# Patient Record
Sex: Male | Born: 1955 | Race: White | Hispanic: No | Marital: Single | Smoking: Former smoker
Health system: Southern US, Community
[De-identification: ages and names within clinical notes are randomized; demographics above are authoritative.]

## PROBLEM LIST (undated history)

## (undated) DIAGNOSIS — R740 Nonspecific elevation of levels of transaminase and lactic acid dehydrogenase [LDH]: Secondary | ICD-10-CM

## (undated) DIAGNOSIS — N62 Hypertrophy of breast: Secondary | ICD-10-CM

## (undated) DIAGNOSIS — I4891 Unspecified atrial fibrillation: Secondary | ICD-10-CM

## (undated) DIAGNOSIS — I5022 Chronic systolic (congestive) heart failure: Secondary | ICD-10-CM

## (undated) DIAGNOSIS — I2510 Atherosclerotic heart disease of native coronary artery without angina pectoris: Secondary | ICD-10-CM

## (undated) DIAGNOSIS — R7402 Elevation of levels of lactic acid dehydrogenase (LDH): Secondary | ICD-10-CM

## (undated) DIAGNOSIS — I255 Ischemic cardiomyopathy: Secondary | ICD-10-CM

## (undated) DIAGNOSIS — I4729 Other ventricular tachycardia: Secondary | ICD-10-CM

## (undated) DIAGNOSIS — E785 Hyperlipidemia, unspecified: Secondary | ICD-10-CM

## (undated) DIAGNOSIS — Z95810 Presence of automatic (implantable) cardiac defibrillator: Secondary | ICD-10-CM

## (undated) DIAGNOSIS — I472 Ventricular tachycardia, unspecified: Secondary | ICD-10-CM

## (undated) DIAGNOSIS — R7401 Elevation of levels of liver transaminase levels: Secondary | ICD-10-CM

## (undated) DIAGNOSIS — I442 Atrioventricular block, complete: Secondary | ICD-10-CM

## (undated) DIAGNOSIS — Z7901 Long term (current) use of anticoagulants: Secondary | ICD-10-CM

## (undated) DIAGNOSIS — E0590 Thyrotoxicosis, unspecified without thyrotoxic crisis or storm: Secondary | ICD-10-CM

## (undated) HISTORY — PX: SPLENECTOMY: SUR1306

## (undated) HISTORY — DX: Atrioventricular block, complete: I44.2

## (undated) HISTORY — DX: Morbid (severe) obesity due to excess calories: E66.01

## (undated) HISTORY — DX: Ventricular tachycardia, unspecified: I47.20

## (undated) HISTORY — DX: Ischemic cardiomyopathy: I25.5

## (undated) HISTORY — DX: Unspecified atrial fibrillation: I48.91

## (undated) HISTORY — DX: Other ventricular tachycardia: I47.29

## (undated) HISTORY — PX: OTHER SURGICAL HISTORY: SHX169

## (undated) HISTORY — DX: Hypertrophy of breast: N62

## (undated) HISTORY — DX: Nonspecific elevation of levels of transaminase and lactic acid dehydrogenase (ldh): R74.0

## (undated) HISTORY — DX: Ventricular tachycardia: I47.2

## (undated) HISTORY — DX: Atherosclerotic heart disease of native coronary artery without angina pectoris: I25.10

## (undated) HISTORY — DX: Elevation of levels of liver transaminase levels: R74.01

## (undated) HISTORY — DX: Presence of automatic (implantable) cardiac defibrillator: Z95.810

## (undated) HISTORY — DX: Hyperlipidemia, unspecified: E78.5

## (undated) HISTORY — DX: Thyrotoxicosis, unspecified without thyrotoxic crisis or storm: E05.90

## (undated) HISTORY — DX: Chronic systolic (congestive) heart failure: I50.22

## (undated) HISTORY — DX: Elevation of levels of lactic acid dehydrogenase (LDH): R74.02

## (undated) HISTORY — DX: Long term (current) use of anticoagulants: Z79.01

---

## 1998-10-05 ENCOUNTER — Inpatient Hospital Stay (HOSPITAL_COMMUNITY): Admission: EM | Admit: 1998-10-05 | Discharge: 1998-10-09 | Payer: Self-pay | Admitting: Emergency Medicine

## 1998-10-05 ENCOUNTER — Encounter: Payer: Self-pay | Admitting: Emergency Medicine

## 1998-10-15 ENCOUNTER — Inpatient Hospital Stay (HOSPITAL_COMMUNITY): Admission: EM | Admit: 1998-10-15 | Discharge: 1998-10-28 | Payer: Self-pay | Admitting: Cardiology

## 1998-10-27 ENCOUNTER — Encounter: Payer: Self-pay | Admitting: Cardiology

## 2000-01-03 ENCOUNTER — Encounter: Payer: Self-pay | Admitting: *Deleted

## 2000-01-03 ENCOUNTER — Inpatient Hospital Stay (HOSPITAL_COMMUNITY): Admission: EM | Admit: 2000-01-03 | Discharge: 2000-01-13 | Payer: Self-pay | Admitting: *Deleted

## 2000-03-08 ENCOUNTER — Ambulatory Visit (HOSPITAL_COMMUNITY): Admission: RE | Admit: 2000-03-08 | Discharge: 2000-03-08 | Payer: Self-pay | Admitting: Internal Medicine

## 2000-09-27 ENCOUNTER — Emergency Department (HOSPITAL_COMMUNITY): Admission: EM | Admit: 2000-09-27 | Discharge: 2000-09-27 | Payer: Self-pay | Admitting: Emergency Medicine

## 2000-11-21 ENCOUNTER — Encounter: Payer: Self-pay | Admitting: Emergency Medicine

## 2000-11-21 ENCOUNTER — Inpatient Hospital Stay (HOSPITAL_COMMUNITY): Admission: EM | Admit: 2000-11-21 | Discharge: 2000-11-22 | Payer: Self-pay | Admitting: Emergency Medicine

## 2000-11-22 ENCOUNTER — Encounter: Payer: Self-pay | Admitting: Internal Medicine

## 2000-12-22 ENCOUNTER — Ambulatory Visit (HOSPITAL_COMMUNITY): Admission: RE | Admit: 2000-12-22 | Discharge: 2000-12-23 | Payer: Self-pay | Admitting: Internal Medicine

## 2000-12-23 ENCOUNTER — Encounter: Payer: Self-pay | Admitting: Internal Medicine

## 2001-02-22 ENCOUNTER — Inpatient Hospital Stay (HOSPITAL_COMMUNITY): Admission: AD | Admit: 2001-02-22 | Discharge: 2001-02-28 | Payer: Self-pay | Admitting: *Deleted

## 2001-02-24 ENCOUNTER — Encounter: Payer: Self-pay | Admitting: Internal Medicine

## 2001-02-26 ENCOUNTER — Encounter: Payer: Self-pay | Admitting: Cardiology

## 2002-10-11 ENCOUNTER — Inpatient Hospital Stay (HOSPITAL_COMMUNITY): Admission: AD | Admit: 2002-10-11 | Discharge: 2002-10-12 | Payer: Self-pay | Admitting: Emergency Medicine

## 2002-10-11 ENCOUNTER — Encounter: Payer: Self-pay | Admitting: Emergency Medicine

## 2002-10-16 ENCOUNTER — Inpatient Hospital Stay (HOSPITAL_COMMUNITY): Admission: EM | Admit: 2002-10-16 | Discharge: 2002-10-18 | Payer: Self-pay | Admitting: Emergency Medicine

## 2002-10-17 ENCOUNTER — Encounter: Payer: Self-pay | Admitting: *Deleted

## 2002-10-31 ENCOUNTER — Emergency Department (HOSPITAL_COMMUNITY): Admission: EM | Admit: 2002-10-31 | Discharge: 2002-10-31 | Payer: Self-pay | Admitting: Emergency Medicine

## 2002-10-31 ENCOUNTER — Encounter: Payer: Self-pay | Admitting: Emergency Medicine

## 2003-10-24 ENCOUNTER — Ambulatory Visit (HOSPITAL_COMMUNITY): Admission: RE | Admit: 2003-10-24 | Discharge: 2003-10-24 | Payer: Self-pay | Admitting: Endocrinology

## 2003-10-28 ENCOUNTER — Observation Stay (HOSPITAL_COMMUNITY): Admission: RE | Admit: 2003-10-28 | Discharge: 2003-10-28 | Payer: Self-pay | Admitting: Internal Medicine

## 2004-01-16 ENCOUNTER — Inpatient Hospital Stay (HOSPITAL_COMMUNITY): Admission: EM | Admit: 2004-01-16 | Discharge: 2004-01-21 | Payer: Self-pay | Admitting: Cardiology

## 2004-01-19 ENCOUNTER — Encounter: Payer: Self-pay | Admitting: Cardiology

## 2004-02-16 ENCOUNTER — Ambulatory Visit: Admission: RE | Admit: 2004-02-16 | Discharge: 2004-02-16 | Payer: Self-pay | Admitting: Internal Medicine

## 2004-03-11 ENCOUNTER — Ambulatory Visit: Payer: Self-pay

## 2004-03-27 ENCOUNTER — Emergency Department: Payer: Self-pay | Admitting: General Practice

## 2004-03-29 ENCOUNTER — Ambulatory Visit: Payer: Self-pay | Admitting: Internal Medicine

## 2004-03-29 ENCOUNTER — Ambulatory Visit: Payer: Self-pay | Admitting: Cardiology

## 2004-04-06 ENCOUNTER — Ambulatory Visit: Payer: Self-pay | Admitting: Internal Medicine

## 2004-04-19 ENCOUNTER — Ambulatory Visit: Payer: Self-pay | Admitting: Cardiology

## 2004-04-19 ENCOUNTER — Emergency Department: Payer: Self-pay | Admitting: Emergency Medicine

## 2004-04-29 ENCOUNTER — Ambulatory Visit: Payer: Self-pay | Admitting: Cardiovascular Disease

## 2004-04-29 ENCOUNTER — Ambulatory Visit: Payer: Self-pay | Admitting: *Deleted

## 2004-05-04 ENCOUNTER — Ambulatory Visit: Payer: Self-pay | Admitting: *Deleted

## 2004-05-10 ENCOUNTER — Ambulatory Visit: Payer: Self-pay | Admitting: Internal Medicine

## 2004-05-19 ENCOUNTER — Ambulatory Visit: Payer: Self-pay | Admitting: Internal Medicine

## 2004-06-07 ENCOUNTER — Ambulatory Visit: Payer: Self-pay | Admitting: *Deleted

## 2004-06-09 ENCOUNTER — Ambulatory Visit: Payer: Self-pay | Admitting: Internal Medicine

## 2004-06-10 ENCOUNTER — Ambulatory Visit: Payer: Self-pay | Admitting: Internal Medicine

## 2004-06-11 ENCOUNTER — Inpatient Hospital Stay (HOSPITAL_COMMUNITY): Admission: RE | Admit: 2004-06-11 | Discharge: 2004-06-12 | Payer: Self-pay | Admitting: Internal Medicine

## 2004-06-18 ENCOUNTER — Ambulatory Visit: Payer: Self-pay | Admitting: Internal Medicine

## 2004-06-21 ENCOUNTER — Ambulatory Visit: Payer: Self-pay | Admitting: Cardiology

## 2004-06-25 ENCOUNTER — Ambulatory Visit: Payer: Self-pay | Admitting: Cardiology

## 2004-07-06 ENCOUNTER — Ambulatory Visit: Payer: Self-pay | Admitting: Internal Medicine

## 2004-07-13 ENCOUNTER — Ambulatory Visit: Payer: Self-pay | Admitting: Internal Medicine

## 2004-07-13 ENCOUNTER — Ambulatory Visit: Payer: Self-pay

## 2004-08-17 ENCOUNTER — Ambulatory Visit: Payer: Self-pay

## 2004-08-17 ENCOUNTER — Ambulatory Visit: Payer: Self-pay | Admitting: Internal Medicine

## 2004-08-31 ENCOUNTER — Ambulatory Visit: Payer: Self-pay | Admitting: Internal Medicine

## 2004-09-08 ENCOUNTER — Ambulatory Visit: Payer: Self-pay | Admitting: Cardiology

## 2004-10-11 ENCOUNTER — Ambulatory Visit: Payer: Self-pay | Admitting: Internal Medicine

## 2004-11-16 ENCOUNTER — Ambulatory Visit: Payer: Self-pay | Admitting: Cardiology

## 2004-11-26 ENCOUNTER — Ambulatory Visit: Payer: Self-pay | Admitting: Cardiology

## 2004-12-13 ENCOUNTER — Ambulatory Visit: Payer: Self-pay | Admitting: Internal Medicine

## 2004-12-29 ENCOUNTER — Ambulatory Visit: Payer: Self-pay | Admitting: Cardiology

## 2005-01-19 ENCOUNTER — Ambulatory Visit: Payer: Self-pay | Admitting: Cardiology

## 2005-03-08 ENCOUNTER — Ambulatory Visit: Payer: Self-pay | Admitting: Cardiology

## 2005-03-25 ENCOUNTER — Ambulatory Visit: Payer: Self-pay | Admitting: Cardiology

## 2005-04-15 ENCOUNTER — Ambulatory Visit: Payer: Self-pay | Admitting: *Deleted

## 2005-05-16 ENCOUNTER — Ambulatory Visit: Payer: Self-pay | Admitting: Cardiology

## 2005-06-20 ENCOUNTER — Ambulatory Visit: Payer: Self-pay | Admitting: *Deleted

## 2005-06-20 ENCOUNTER — Ambulatory Visit: Payer: Self-pay | Admitting: Cardiology

## 2005-07-01 ENCOUNTER — Ambulatory Visit: Payer: Self-pay | Admitting: Internal Medicine

## 2005-08-12 ENCOUNTER — Ambulatory Visit: Payer: Self-pay | Admitting: Cardiology

## 2005-08-29 ENCOUNTER — Ambulatory Visit: Payer: Self-pay | Admitting: Internal Medicine

## 2005-09-05 ENCOUNTER — Inpatient Hospital Stay (HOSPITAL_BASED_OUTPATIENT_CLINIC_OR_DEPARTMENT_OTHER): Admission: RE | Admit: 2005-09-05 | Discharge: 2005-09-05 | Payer: Self-pay | Admitting: Cardiovascular Disease

## 2005-09-05 ENCOUNTER — Ambulatory Visit: Payer: Self-pay | Admitting: Cardiovascular Disease

## 2005-09-05 ENCOUNTER — Ambulatory Visit: Payer: Self-pay | Admitting: Cardiology

## 2005-09-09 ENCOUNTER — Ambulatory Visit: Payer: Self-pay | Admitting: *Deleted

## 2005-09-21 ENCOUNTER — Ambulatory Visit: Payer: Self-pay | Admitting: Cardiology

## 2005-09-29 ENCOUNTER — Ambulatory Visit: Payer: Self-pay | Admitting: *Deleted

## 2005-10-04 ENCOUNTER — Ambulatory Visit: Payer: Self-pay | Admitting: Internal Medicine

## 2005-11-01 ENCOUNTER — Ambulatory Visit: Payer: Self-pay | Admitting: *Deleted

## 2005-11-18 ENCOUNTER — Ambulatory Visit: Payer: Self-pay | Admitting: Cardiology

## 2005-11-29 ENCOUNTER — Ambulatory Visit: Payer: Self-pay | Admitting: *Deleted

## 2005-12-28 ENCOUNTER — Ambulatory Visit: Payer: Self-pay | Admitting: *Deleted

## 2006-01-08 ENCOUNTER — Ambulatory Visit: Payer: Self-pay | Admitting: Internal Medicine

## 2006-02-09 ENCOUNTER — Ambulatory Visit: Payer: Self-pay | Admitting: Cardiology

## 2006-02-16 ENCOUNTER — Ambulatory Visit: Payer: Self-pay | Admitting: Cardiology

## 2006-04-10 ENCOUNTER — Ambulatory Visit: Payer: Self-pay

## 2006-07-12 ENCOUNTER — Ambulatory Visit: Payer: Self-pay | Admitting: Internal Medicine

## 2006-08-22 ENCOUNTER — Inpatient Hospital Stay (HOSPITAL_COMMUNITY): Admission: EM | Admit: 2006-08-22 | Discharge: 2006-08-22 | Payer: Self-pay | Admitting: Emergency Medicine

## 2006-09-11 ENCOUNTER — Ambulatory Visit: Payer: Self-pay | Admitting: Cardiology

## 2006-09-18 ENCOUNTER — Ambulatory Visit: Payer: Self-pay | Admitting: Cardiology

## 2006-10-02 ENCOUNTER — Ambulatory Visit: Payer: Self-pay | Admitting: Cardiology

## 2006-10-11 ENCOUNTER — Ambulatory Visit: Payer: Self-pay | Admitting: Internal Medicine

## 2006-11-06 ENCOUNTER — Ambulatory Visit: Payer: Self-pay | Admitting: Internal Medicine

## 2006-11-15 ENCOUNTER — Ambulatory Visit: Payer: Self-pay | Admitting: Internal Medicine

## 2006-11-15 LAB — CONVERTED CEMR LAB
ALT: 30 units/L (ref 0–53)
AST: 29 units/L (ref 0–37)
Alkaline Phosphatase: 61 units/L (ref 39–117)
Digitoxin Lvl: 0.3 ng/mL — ABNORMAL LOW (ref 0.8–2.0)

## 2006-11-24 ENCOUNTER — Ambulatory Visit: Payer: Self-pay | Admitting: Internal Medicine

## 2006-12-01 ENCOUNTER — Ambulatory Visit: Payer: Self-pay | Admitting: Cardiology

## 2007-01-25 ENCOUNTER — Ambulatory Visit (HOSPITAL_COMMUNITY): Admission: RE | Admit: 2007-01-25 | Discharge: 2007-01-25 | Payer: Self-pay | Admitting: Cardiology

## 2007-01-25 ENCOUNTER — Ambulatory Visit: Payer: Self-pay | Admitting: Cardiology

## 2007-01-25 LAB — CONVERTED CEMR LAB
ALT: 22 units/L (ref 0–53)
Albumin: 3.5 g/dL (ref 3.5–5.2)
Alkaline Phosphatase: 60 units/L (ref 39–117)
BUN: 14 mg/dL (ref 6–23)
CO2: 27 meq/L (ref 19–32)
Calcium: 9 mg/dL (ref 8.4–10.5)
Chloride: 108 meq/L (ref 96–112)
HDL: 30.9 mg/dL — ABNORMAL LOW (ref >39.0)
LDL Cholesterol: 96 mg/dL (ref 0–99)
Lymphocytes Relative: 27.6 % (ref 12.0–46.0)
Neutro Abs: 5.3 10*3/uL (ref 1.4–7.7)
Platelets: 187 10*3/uL (ref 150–400)
Potassium: 4.4 meq/L (ref 3.5–5.1)
RDW: 13.9 % (ref 11.5–14.6)
Sodium: 140 meq/L (ref 135–145)
TSH: 1.41 microintl units/mL (ref 0.35–5.50)
Total Bilirubin: 1.1 mg/dL (ref 0.3–1.2)
Total CHOL/HDL Ratio: 4.7
Total Protein: 6.6 g/dL (ref 6.0–8.3)
Triglycerides: 96 mg/dL (ref 0–149)
VLDL: 19 mg/dL (ref 0–40)

## 2007-02-13 ENCOUNTER — Ambulatory Visit: Payer: Self-pay | Admitting: Internal Medicine

## 2007-05-05 ENCOUNTER — Emergency Department (HOSPITAL_COMMUNITY): Admission: EM | Admit: 2007-05-05 | Discharge: 2007-05-05 | Payer: Self-pay | Admitting: Emergency Medicine

## 2007-05-15 ENCOUNTER — Ambulatory Visit: Payer: Self-pay | Admitting: Internal Medicine

## 2007-07-08 ENCOUNTER — Emergency Department (HOSPITAL_COMMUNITY): Admission: EM | Admit: 2007-07-08 | Discharge: 2007-07-09 | Payer: Self-pay | Admitting: Emergency Medicine

## 2007-07-16 ENCOUNTER — Ambulatory Visit: Payer: Self-pay | Admitting: Cardiology

## 2007-07-16 LAB — CONVERTED CEMR LAB: Prothrombin Time: 17.7 s — ABNORMAL HIGH (ref 10.9–13.3)

## 2007-07-23 ENCOUNTER — Ambulatory Visit: Payer: Self-pay

## 2007-07-23 LAB — CONVERTED CEMR LAB
ALT: 30 U/L
AST: 28 U/L
Albumin: 3.6 g/dL
Alkaline Phosphatase: 60 U/L
BUN: 18 mg/dL
Bilirubin, Direct: 0.1 mg/dL
CO2: 27 meq/L
Calcium: 9 mg/dL
Chloride: 107 meq/L
Cholesterol: 153 mg/dL
Creatinine, Ser: 1.2 mg/dL
GFR calc Af Amer: 82 mL/min
GFR calc non Af Amer: 68 mL/min
Glucose, Bld: 127 mg/dL — ABNORMAL HIGH
HDL: 31.9 mg/dL — ABNORMAL LOW
LDL Cholesterol: 103 mg/dL — ABNORMAL HIGH
Potassium: 4.6 meq/L
Sodium: 139 meq/L
TSH: 1.99 u[IU]/mL
Total Bilirubin: 1 mg/dL
Total CHOL/HDL Ratio: 4.8
Total Protein: 6.7 g/dL
Triglycerides: 90 mg/dL
VLDL: 18 mg/dL

## 2007-08-14 ENCOUNTER — Ambulatory Visit: Payer: Self-pay | Admitting: Internal Medicine

## 2007-10-27 ENCOUNTER — Other Ambulatory Visit: Payer: Self-pay

## 2007-10-27 ENCOUNTER — Ambulatory Visit: Payer: Self-pay | Admitting: Cardiology

## 2007-10-27 ENCOUNTER — Inpatient Hospital Stay: Payer: Self-pay | Admitting: Internal Medicine

## 2007-11-19 ENCOUNTER — Ambulatory Visit: Payer: Self-pay | Admitting: Cardiology

## 2007-12-18 ENCOUNTER — Ambulatory Visit: Payer: Self-pay | Admitting: Internal Medicine

## 2008-02-08 ENCOUNTER — Emergency Department: Payer: Self-pay | Admitting: Emergency Medicine

## 2008-03-19 ENCOUNTER — Ambulatory Visit: Payer: Self-pay | Admitting: Internal Medicine

## 2008-06-11 ENCOUNTER — Encounter: Payer: Self-pay | Admitting: Internal Medicine

## 2008-06-18 ENCOUNTER — Ambulatory Visit: Payer: Self-pay

## 2008-07-14 ENCOUNTER — Ambulatory Visit: Payer: Self-pay | Admitting: Cardiology

## 2008-07-18 DIAGNOSIS — I472 Ventricular tachycardia, unspecified: Secondary | ICD-10-CM | POA: Insufficient documentation

## 2008-07-18 DIAGNOSIS — I4891 Unspecified atrial fibrillation: Secondary | ICD-10-CM | POA: Insufficient documentation

## 2008-07-18 DIAGNOSIS — E785 Hyperlipidemia, unspecified: Secondary | ICD-10-CM | POA: Insufficient documentation

## 2008-07-18 DIAGNOSIS — E0590 Thyrotoxicosis, unspecified without thyrotoxic crisis or storm: Secondary | ICD-10-CM | POA: Insufficient documentation

## 2008-07-21 ENCOUNTER — Ambulatory Visit: Payer: Self-pay | Admitting: Cardiology

## 2008-08-11 ENCOUNTER — Ambulatory Visit (HOSPITAL_COMMUNITY): Admission: RE | Admit: 2008-08-11 | Discharge: 2008-08-11 | Payer: Self-pay | Admitting: Cardiology

## 2008-08-11 ENCOUNTER — Ambulatory Visit: Payer: Self-pay | Admitting: Cardiology

## 2008-08-15 ENCOUNTER — Encounter (INDEPENDENT_AMBULATORY_CARE_PROVIDER_SITE_OTHER): Payer: Self-pay | Admitting: *Deleted

## 2008-09-01 ENCOUNTER — Telehealth: Payer: Self-pay | Admitting: Cardiology

## 2008-09-08 ENCOUNTER — Encounter: Payer: Self-pay | Admitting: Cardiology

## 2008-09-19 ENCOUNTER — Encounter: Payer: Self-pay | Admitting: Internal Medicine

## 2008-09-25 ENCOUNTER — Ambulatory Visit: Payer: Self-pay | Admitting: Cardiology

## 2008-09-29 ENCOUNTER — Ambulatory Visit: Payer: Self-pay | Admitting: Internal Medicine

## 2008-10-09 ENCOUNTER — Encounter: Payer: Self-pay | Admitting: Cardiology

## 2008-10-09 ENCOUNTER — Ambulatory Visit: Payer: Self-pay | Admitting: Cardiology

## 2008-10-15 ENCOUNTER — Encounter (INDEPENDENT_AMBULATORY_CARE_PROVIDER_SITE_OTHER): Payer: Self-pay | Admitting: *Deleted

## 2008-10-27 ENCOUNTER — Emergency Department (HOSPITAL_COMMUNITY): Admission: EM | Admit: 2008-10-27 | Discharge: 2008-10-27 | Payer: Self-pay | Admitting: Emergency Medicine

## 2008-10-30 ENCOUNTER — Encounter: Payer: Self-pay | Admitting: Cardiology

## 2008-10-30 ENCOUNTER — Ambulatory Visit: Payer: Self-pay | Admitting: Cardiology

## 2008-10-31 ENCOUNTER — Ambulatory Visit: Payer: Self-pay | Admitting: Cardiology

## 2008-10-31 ENCOUNTER — Inpatient Hospital Stay (HOSPITAL_COMMUNITY): Admission: EM | Admit: 2008-10-31 | Discharge: 2008-11-08 | Payer: Self-pay | Admitting: Emergency Medicine

## 2008-10-31 ENCOUNTER — Telehealth: Payer: Self-pay | Admitting: Cardiology

## 2008-11-04 ENCOUNTER — Encounter: Payer: Self-pay | Admitting: Cardiology

## 2008-11-09 ENCOUNTER — Emergency Department (HOSPITAL_COMMUNITY): Admission: EM | Admit: 2008-11-09 | Discharge: 2008-11-09 | Payer: Self-pay | Admitting: Emergency Medicine

## 2008-11-10 ENCOUNTER — Telehealth: Payer: Self-pay | Admitting: Cardiology

## 2008-11-11 ENCOUNTER — Encounter (INDEPENDENT_AMBULATORY_CARE_PROVIDER_SITE_OTHER): Payer: Self-pay | Admitting: Cardiology

## 2008-11-11 ENCOUNTER — Ambulatory Visit: Payer: Self-pay | Admitting: Cardiovascular Disease

## 2008-11-11 LAB — CONVERTED CEMR LAB: POC INR: 1.2

## 2008-11-14 ENCOUNTER — Telehealth: Payer: Self-pay | Admitting: Cardiovascular Disease

## 2008-11-17 ENCOUNTER — Ambulatory Visit: Payer: Self-pay | Admitting: Cardiology

## 2008-11-24 ENCOUNTER — Ambulatory Visit: Payer: Self-pay | Admitting: Cardiology

## 2008-11-26 ENCOUNTER — Encounter: Payer: Self-pay | Admitting: Physician Assistant

## 2008-11-26 ENCOUNTER — Ambulatory Visit: Payer: Self-pay | Admitting: Cardiology

## 2008-11-26 DIAGNOSIS — I5023 Acute on chronic systolic (congestive) heart failure: Secondary | ICD-10-CM | POA: Insufficient documentation

## 2008-11-27 ENCOUNTER — Ambulatory Visit: Payer: Self-pay | Admitting: Cardiology

## 2008-12-01 ENCOUNTER — Ambulatory Visit: Payer: Self-pay | Admitting: Internal Medicine

## 2008-12-01 DIAGNOSIS — I442 Atrioventricular block, complete: Secondary | ICD-10-CM | POA: Insufficient documentation

## 2008-12-02 LAB — CONVERTED CEMR LAB
BUN: 18 mg/dL
CO2: 28 meq/L
Calcium: 9 mg/dL
Chloride: 108 meq/L
Creatinine, Ser: 1 mg/dL
GFR calc non Af Amer: 83.11 mL/min
Glucose, Bld: 174 mg/dL — ABNORMAL HIGH
Potassium: 4 meq/L
Sodium: 140 meq/L

## 2008-12-04 ENCOUNTER — Encounter: Payer: Self-pay | Admitting: Cardiology

## 2008-12-04 ENCOUNTER — Ambulatory Visit: Payer: Self-pay

## 2008-12-12 ENCOUNTER — Telehealth (INDEPENDENT_AMBULATORY_CARE_PROVIDER_SITE_OTHER): Payer: Self-pay | Admitting: *Deleted

## 2008-12-12 ENCOUNTER — Ambulatory Visit: Payer: Self-pay | Admitting: Internal Medicine

## 2008-12-12 LAB — CONVERTED CEMR LAB
Basophils Absolute: 0 10*3/uL (ref 0.0–0.1)
CO2: 28 meq/L (ref 19–32)
Chloride: 106 meq/L (ref 96–112)
Eosinophils Relative: 1.8 % (ref 0.0–5.0)
HCT: 42.3 % (ref 39.0–52.0)
Hemoglobin: 14.5 g/dL (ref 13.0–17.0)
Lymphocytes Relative: 20.1 % (ref 12.0–46.0)
MCHC: 34.4 g/dL (ref 30.0–36.0)
Monocytes Relative: 9.1 % (ref 3.0–12.0)
Neutrophils Relative %: 68.6 % (ref 43.0–77.0)
Prothrombin Time: 21.3 s — ABNORMAL HIGH (ref 9.1–11.7)
RDW: 13.7 % (ref 11.5–14.6)
Sodium: 140 meq/L (ref 135–145)
WBC: 10.1 10*3/uL (ref 4.5–10.5)

## 2008-12-15 ENCOUNTER — Encounter: Payer: Self-pay | Admitting: *Deleted

## 2008-12-17 ENCOUNTER — Ambulatory Visit: Payer: Self-pay | Admitting: Internal Medicine

## 2008-12-17 ENCOUNTER — Ambulatory Visit (HOSPITAL_COMMUNITY): Admission: RE | Admit: 2008-12-17 | Discharge: 2008-12-18 | Payer: Self-pay | Admitting: Internal Medicine

## 2008-12-18 ENCOUNTER — Encounter: Payer: Self-pay | Admitting: Internal Medicine

## 2008-12-22 ENCOUNTER — Ambulatory Visit: Payer: Self-pay | Admitting: Cardiology

## 2009-01-01 ENCOUNTER — Ambulatory Visit: Payer: Self-pay | Admitting: Internal Medicine

## 2009-01-01 ENCOUNTER — Encounter: Payer: Self-pay | Admitting: Internal Medicine

## 2009-01-01 ENCOUNTER — Ambulatory Visit: Payer: Self-pay

## 2009-01-01 DIAGNOSIS — I509 Heart failure, unspecified: Secondary | ICD-10-CM | POA: Insufficient documentation

## 2009-01-01 LAB — CONVERTED CEMR LAB
BUN: 15 mg/dL (ref 6–23)
Chloride: 107 meq/L (ref 96–112)
GFR calc non Af Amer: 74.43 mL/min (ref >60)
Glucose, Bld: 71 mg/dL (ref 70–99)
Potassium: 3.6 meq/L (ref 3.5–5.1)
Sodium: 141 meq/L (ref 135–145)

## 2009-01-06 ENCOUNTER — Ambulatory Visit: Payer: Self-pay | Admitting: Internal Medicine

## 2009-01-06 ENCOUNTER — Telehealth: Payer: Self-pay | Admitting: Internal Medicine

## 2009-01-06 LAB — CONVERTED CEMR LAB
CO2: 26 meq/L (ref 19–32)
Calcium: 8.8 mg/dL (ref 8.4–10.5)
Creatinine, Ser: 1.1 mg/dL (ref 0.4–1.5)
Glucose, Bld: 126 mg/dL — ABNORMAL HIGH (ref 70–99)
Lymphocytes Relative: 19.9 % (ref 12.0–46.0)
MCHC: 34.5 g/dL (ref 30.0–36.0)
Monocytes Absolute: 0.8 10*3/uL (ref 0.1–1.0)
Platelets: 153 10*3/uL (ref 150.0–400.0)
Potassium: 3.7 meq/L (ref 3.5–5.1)
Sodium: 139 meq/L (ref 135–145)
WBC: 10.7 10*3/uL — ABNORMAL HIGH (ref 4.5–10.5)

## 2009-01-07 ENCOUNTER — Ambulatory Visit (HOSPITAL_COMMUNITY): Admission: RE | Admit: 2009-01-07 | Discharge: 2009-01-07 | Payer: Self-pay | Admitting: Internal Medicine

## 2009-01-13 ENCOUNTER — Ambulatory Visit (HOSPITAL_COMMUNITY): Admission: RE | Admit: 2009-01-13 | Discharge: 2009-01-13 | Payer: Self-pay | Admitting: Internal Medicine

## 2009-01-13 ENCOUNTER — Ambulatory Visit: Payer: Self-pay | Admitting: Internal Medicine

## 2009-01-20 ENCOUNTER — Telehealth: Payer: Self-pay | Admitting: Internal Medicine

## 2009-01-26 ENCOUNTER — Encounter (INDEPENDENT_AMBULATORY_CARE_PROVIDER_SITE_OTHER): Payer: Self-pay | Admitting: *Deleted

## 2009-02-02 ENCOUNTER — Ambulatory Visit: Payer: Self-pay | Admitting: Cardiology

## 2009-02-02 ENCOUNTER — Encounter: Payer: Self-pay | Admitting: Internal Medicine

## 2009-02-02 LAB — CONVERTED CEMR LAB: POC INR: 1.6

## 2009-02-04 ENCOUNTER — Encounter: Payer: Self-pay | Admitting: Internal Medicine

## 2009-02-06 ENCOUNTER — Observation Stay (HOSPITAL_COMMUNITY): Admission: RE | Admit: 2009-02-06 | Discharge: 2009-02-07 | Payer: Self-pay | Admitting: Internal Medicine

## 2009-02-06 ENCOUNTER — Ambulatory Visit: Payer: Self-pay | Admitting: Internal Medicine

## 2009-02-18 ENCOUNTER — Ambulatory Visit: Payer: Self-pay | Admitting: Cardiology

## 2009-02-18 LAB — CONVERTED CEMR LAB: POC INR: 1.9

## 2009-03-03 ENCOUNTER — Encounter (INDEPENDENT_AMBULATORY_CARE_PROVIDER_SITE_OTHER): Payer: Self-pay | Admitting: *Deleted

## 2009-03-04 ENCOUNTER — Ambulatory Visit: Payer: Self-pay | Admitting: Cardiology

## 2009-03-04 ENCOUNTER — Encounter: Payer: Self-pay | Admitting: Internal Medicine

## 2009-03-04 ENCOUNTER — Encounter: Payer: Self-pay | Admitting: Physician Assistant

## 2009-03-04 LAB — CONVERTED CEMR LAB: POC INR: 1.9

## 2009-03-12 LAB — CONVERTED CEMR LAB
BUN: 14 mg/dL (ref 6–23)
Calcium: 9.3 mg/dL (ref 8.4–10.5)
Chloride: 106 meq/L (ref 96–112)

## 2009-03-23 ENCOUNTER — Encounter: Payer: Self-pay | Admitting: Internal Medicine

## 2009-03-24 ENCOUNTER — Ambulatory Visit: Payer: Self-pay | Admitting: Internal Medicine

## 2009-03-25 ENCOUNTER — Ambulatory Visit: Payer: Self-pay | Admitting: Cardiology

## 2009-03-25 LAB — CONVERTED CEMR LAB: POC INR: 2.3

## 2009-04-23 ENCOUNTER — Ambulatory Visit: Payer: Self-pay | Admitting: Cardiology

## 2009-05-21 ENCOUNTER — Ambulatory Visit: Payer: Self-pay | Admitting: Cardiology

## 2009-05-21 LAB — CONVERTED CEMR LAB: POC INR: 2.3

## 2009-06-22 ENCOUNTER — Ambulatory Visit: Payer: Self-pay | Admitting: Cardiology

## 2009-06-22 LAB — CONVERTED CEMR LAB: POC INR: 2.2

## 2009-06-23 ENCOUNTER — Encounter (INDEPENDENT_AMBULATORY_CARE_PROVIDER_SITE_OTHER): Payer: Self-pay | Admitting: *Deleted

## 2009-06-29 ENCOUNTER — Telehealth: Payer: Self-pay | Admitting: Internal Medicine

## 2009-07-17 ENCOUNTER — Encounter (INDEPENDENT_AMBULATORY_CARE_PROVIDER_SITE_OTHER): Payer: Self-pay | Admitting: *Deleted

## 2009-07-20 ENCOUNTER — Ambulatory Visit: Payer: Self-pay | Admitting: Cardiology

## 2009-07-20 LAB — CONVERTED CEMR LAB: POC INR: 2.2

## 2009-08-19 ENCOUNTER — Ambulatory Visit: Payer: Self-pay | Admitting: Cardiology

## 2009-08-19 ENCOUNTER — Ambulatory Visit: Payer: Self-pay | Admitting: Internal Medicine

## 2009-09-01 ENCOUNTER — Telehealth: Payer: Self-pay | Admitting: Internal Medicine

## 2009-09-07 ENCOUNTER — Ambulatory Visit: Payer: Self-pay | Admitting: Cardiology

## 2009-09-07 DIAGNOSIS — I2510 Atherosclerotic heart disease of native coronary artery without angina pectoris: Secondary | ICD-10-CM | POA: Insufficient documentation

## 2009-09-07 DIAGNOSIS — Z9861 Coronary angioplasty status: Secondary | ICD-10-CM

## 2009-09-09 ENCOUNTER — Encounter (INDEPENDENT_AMBULATORY_CARE_PROVIDER_SITE_OTHER): Payer: Self-pay | Admitting: *Deleted

## 2009-09-14 ENCOUNTER — Telehealth: Payer: Self-pay | Admitting: Internal Medicine

## 2009-09-24 ENCOUNTER — Ambulatory Visit: Payer: Self-pay | Admitting: Internal Medicine

## 2009-09-24 ENCOUNTER — Ambulatory Visit: Payer: Self-pay | Admitting: Cardiology

## 2009-09-24 LAB — CONVERTED CEMR LAB: POC INR: 2.9

## 2009-10-08 ENCOUNTER — Encounter (INDEPENDENT_AMBULATORY_CARE_PROVIDER_SITE_OTHER): Payer: Self-pay | Admitting: *Deleted

## 2009-10-14 ENCOUNTER — Ambulatory Visit: Payer: Self-pay | Admitting: Cardiology

## 2009-10-29 ENCOUNTER — Telehealth: Payer: Self-pay | Admitting: Cardiology

## 2009-11-11 ENCOUNTER — Encounter (INDEPENDENT_AMBULATORY_CARE_PROVIDER_SITE_OTHER): Payer: Self-pay | Admitting: *Deleted

## 2009-12-07 ENCOUNTER — Encounter: Payer: Self-pay | Admitting: Internal Medicine

## 2009-12-16 ENCOUNTER — Encounter (INDEPENDENT_AMBULATORY_CARE_PROVIDER_SITE_OTHER): Payer: Self-pay | Admitting: Pharmacist

## 2010-01-01 ENCOUNTER — Ambulatory Visit: Payer: Self-pay | Admitting: Internal Medicine

## 2010-01-01 ENCOUNTER — Ambulatory Visit: Payer: Self-pay | Admitting: Cardiology

## 2010-01-01 LAB — CONVERTED CEMR LAB: POC INR: 2.8

## 2010-01-28 ENCOUNTER — Ambulatory Visit: Payer: Self-pay | Admitting: Cardiology

## 2010-01-28 LAB — CONVERTED CEMR LAB: POC INR: 2.9

## 2010-03-08 ENCOUNTER — Encounter (INDEPENDENT_AMBULATORY_CARE_PROVIDER_SITE_OTHER): Payer: Self-pay | Admitting: *Deleted

## 2010-03-16 ENCOUNTER — Telehealth: Payer: Self-pay | Admitting: Cardiology

## 2010-03-18 ENCOUNTER — Telehealth: Payer: Self-pay | Admitting: Cardiology

## 2010-03-24 ENCOUNTER — Telehealth: Payer: Self-pay | Admitting: Cardiology

## 2010-04-01 ENCOUNTER — Ambulatory Visit: Payer: Self-pay | Admitting: Internal Medicine

## 2010-04-02 ENCOUNTER — Encounter: Payer: Self-pay | Admitting: Internal Medicine

## 2010-04-12 ENCOUNTER — Telehealth (INDEPENDENT_AMBULATORY_CARE_PROVIDER_SITE_OTHER): Payer: Self-pay | Admitting: *Deleted

## 2010-04-20 ENCOUNTER — Ambulatory Visit: Payer: Self-pay | Admitting: Cardiology

## 2010-04-21 ENCOUNTER — Encounter: Payer: Self-pay | Admitting: Internal Medicine

## 2010-04-21 ENCOUNTER — Ambulatory Visit
Admission: RE | Admit: 2010-04-21 | Discharge: 2010-04-21 | Payer: Self-pay | Source: Home / Self Care | Attending: Internal Medicine | Admitting: Internal Medicine

## 2010-04-21 ENCOUNTER — Telehealth (INDEPENDENT_AMBULATORY_CARE_PROVIDER_SITE_OTHER): Payer: Self-pay | Admitting: *Deleted

## 2010-05-12 ENCOUNTER — Encounter (INDEPENDENT_AMBULATORY_CARE_PROVIDER_SITE_OTHER): Payer: Self-pay | Admitting: *Deleted

## 2010-05-30 LAB — CONVERTED CEMR LAB
ALT: 25 units/L (ref 0–53)
Albumin: 3.7 g/dL (ref 3.5–5.2)
Basophils Relative: 0.4 % (ref 0.0–3.0)
CO2: 27 meq/L (ref 19–32)
Calcium: 9.2 mg/dL (ref 8.4–10.5)
Creatinine, Ser: 1.1 mg/dL (ref 0.4–1.5)
Eosinophils Relative: 0.7 % (ref 0.0–5.0)
Lymphocytes Relative: 22.7 % (ref 12.0–46.0)
Lymphs Abs: 2.3 10*3/uL (ref 0.7–4.0)
MCHC: 34.1 g/dL (ref 30.0–36.0)
MCV: 104.2 fL — ABNORMAL HIGH (ref 78.0–100.0)
Monocytes Relative: 10.3 % (ref 3.0–12.0)
Neutrophils Relative %: 65.9 % (ref 43.0–77.0)
Platelets: 177 10*3/uL (ref 150.0–400.0)
RBC: 4.27 M/uL (ref 4.22–5.81)
RDW: 14.9 % — ABNORMAL HIGH (ref 11.5–14.6)
Total Protein: 6.7 g/dL (ref 6.0–8.3)
VLDL: 25 mg/dL (ref 0.0–40.0)
WBC: 10.1 10*3/uL (ref 4.5–10.5)

## 2010-06-01 NOTE — Progress Notes (Signed)
Summary: pacer check  Phone Note Call from Patient Call back at [phone]   Caller: Patient Reason for Call: Talk to Nurse Summary of Call: pt is coming to see Dr Graciela Husbands on 4/20, does he need to come sooner to have pacer check, he does has appt with Dr [name] on 3/18  Follow-up for Phone Call        [facility] to keep standing appointments.  Pt will call back with problems between now and then. Gypsy Balsam RN BSN  June 29, 2009 10:50 AM

## 2010-06-01 NOTE — Assessment & Plan Note (Signed)
Summary: rov/per pt req/jss   History of Present Illness: This is a 55 year old white male patient, who underwent radiofrequency catheter ablation of ventricular tachycardia, February 06, 2009. He tolerated the procedure well and was discharged home.   his last catheterization demonstrated Procedure date:  01/20/2004  1.  Severe single-vessel coronary artery disease with total occlusion of his      right coronary.  2.  Patent circumflex and left anterior descending stents.   11/04/2008 echo showed severe depression LV systolic function with ejection fraction 20-25% He's had significant positive recurrence of lightheadedness and shortness of breath over the last couple of weeks. This correlates with significant increased frequency of ventricular tachycardia both sustained and treated and nonsustained. There's been no significant chest discomfort. Exercise tolerance remains stable at class IIB  Previous  dizziness is much improved possibly related to the down titration of his amiodarone       Current Medications (verified): 1)  Lipitor 80 Mg Tabs (Atorvastatin Calcium) .... Take One Tablet By Mouth Daily. 2)  Amiodarone Hcl 200 Mg Tabs (Amiodarone Hcl) .... 1/2 Tablet Once Daily 3)  Carvedilol 12.5 Mg Tabs (Carvedilol) .... Take One Tablet By Mouth Twice A Day 4)  Spironolactone 25 Mg Tabs (Spironolactone) .... Take One Tablet By Mouth Daily 5)  Aspirin 81 Mg Tbec (Aspirin) .... Take One Tablet By Mouth Daily 6)  Lanoxin 0.125 Mg Tabs (Digoxin) .... Once Daily 7)  Niaspan 1000 Mg Cr-Tabs (Niacin (Antihyperlipidemic)) .... Once Daily 8)  Ramipril 2.5 Mg Caps (Ramipril) .... Take One Capsule By Mouth Twice A Day 9)  Warfarin Sodium 2.5 Mg Tabs (Warfarin Sodium) .... Use As Directed By Anticoagulation Clinic 10)  Furosemide 40 Mg Tabs (Furosemide) .... Take One Tablet By Mouth Daily.  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 HEENT Normal.  Neck veins were flat, carotids were brisk.  Lungs were clear.  Heart sounds were regular without murmurs or gallops.  Abdomen was soft with active bowel sounds. There is no clubbing cyanosis or edema. Skin Warm and dry     ICD Specifications Following MD:  Sherryl Manges, MD     ICD Vendor:  Medtronic     ICD Model Number:  [id number]     ICD Serial Number:  [id number] ICD DOI:  12/17/2008     ICD Implanting MD:  Sherryl Manges, MD  Lead 1:    Location: RA     DOI: 10/28/2003     Model #: [id number]     Serial #: [id number]     Status: active Lead 2:    Location: RV     DOI: 10/28/2003     Model #:  [id number]     Serial #: [id number]     Status: active Lead 3:    Location: LV     DOI: 12/17/2008     Model #: [id number]     Serial #: [id number]     Status: active Lead 4:    Location: RV     DOI: 12/17/2008     Model #: [id number]     Serial #: [id number]     Status: active  Indications::  VT  Explantation Comments: 12/17/2008 Intrinsic 7288/[id number] h explanted  ICD Follow Up Remote Check?  No Battery Voltage:  3.17 V     Charge Time:  8.8 seconds     Underlying rhythm:  A AND V PACED AT  30 ICD Dependent:  Yes       ICD Device Measurements Atrium:  Amplitude: PACED AT 30 mV, Impedance: 437 ohms, Threshold: 0.5 V at 0.4 msec Right Ventricle:  Amplitude: PACED AT 30 mV, Impedance: 589 ohms, Threshold: 1.0 V at 0.4 msec Left Ventricle:  Impedance: 836 ohms, Threshold: 1.0 V at 0.4 msec Configuration: LV TIP TO LV RING Shock Impedance: 46/58 ohms   Episodes Coumadin:  Yes Shock:  0     ATP:  183     Nonsustained:  917     Atrial Pacing:  100%     Ventricular Pacing:  100%  Brady Parameters Mode DDDR     Lower Rate Limit:  60     Upper Rate Limit 90 PAV 130     Sensed AV Delay:  100  Tachy Zones VF:  162     VT:  222 FVT VIA VF     VT1:  95     Tech Comments:  Normal device function.  All VT episodes terminated with 1 round of ATP except for 2 which required 2 rounds of ATP.  NID at 100, so episodes are long.  No changes made today.  Follow up per Dr Graciela Husbands. Gypsy Balsam RN BSN  August 19, 2009 3:35 PM   Impression & Recommendations:  Problem # 1:  VENTRICULAR TACHYCARDIA (ICD-427.1) The patient has had a significant increase in his ventricular tachycardia burden particularly over the last 3 weeks. No clear trigger is seen but the quiet following his ablation has clearly ended. We discussed a variety of options. The first was to modify his antiarrhythmic drugs and the options included increasing his amiodarone, adding another medication like sotalol or mexiletine. The second was to consider repeat catheter ablation. The third was in response to the family's request regarding transplantation.  A little worried about the dizziness which we thought was related to amiodarone neurotoxicity. However if the easiest thing to try first and will increase it for a while and see how he does. In the event that he has recurrent symptoms of dizziness we will down titrated and add sotalol 120 twice a day. In the interim, I will plan to discuss with doctors all red and Ladona Ridgel with her we should  pursue repeat catheter ablation or consider referral to [facility]. I have also been in touch with Dr. Jerrol Banana at [facility] who is the chief of their transplant service His updated medication list for this problem includes:    Amiodarone Hcl 400 Mg Tabs (Amiodarone hcl) .[name]... Take one tablet by mouth twice a dayfor 2 weeks, then take one tablet by mouth daily    Carvedilol 12.5 Mg Tabs (Carvedilol) .[name]... Take one tablet by mouth twice a day  Aspirin 81 Mg Tbec (Aspirin) .[name]... Take one tablet by mouth daily    Ramipril 2.5 Mg Caps (Ramipril) .[name]... Take one capsule by mouth twice a day    Warfarin Sodium 2.5 Mg Tabs (Warfarin sodium) ..... Use as directed by anticoagulation clinic  Problem # 2:  AV BLOCK, COMPLETE (ICD-426.0) as above His updated medication list for this problem includes:    Amiodarone Hcl 400 Mg Tabs (Amiodarone hcl) .[name]... Take one tablet by mouth twice a dayfor 2 weeks, then take one tablet by mouth daily    Carvedilol 12.5 Mg Tabs (Carvedilol) .[name]... Take one tablet by mouth twice a day    Aspirin 81 Mg Tbec (Aspirin) .[name]... Take one tablet by mouth daily    Ramipril 2.5 Mg Caps (Ramipril) .[name]... Take one capsule by mouth twice a day    Warfarin Sodium 2.5 Mg Tabs (Warfarin sodium) ..... Use as directed by anticoagulation clinic  Problem # 3:  ISCHEMIC HEART DISEASE (ICD-414.9) it has been a longtime since his last catheterization. I think it is time to repeat catheterization which we will arrange at his next visit His updated medication list for this problem includes:    Carvedilol 12.5 Mg Tabs (Carvedilol) .[name]... Take one tablet by mouth twice a day    Aspirin 81 Mg Tbec (Aspirin) .[name]... Take one tablet by mouth daily    Ramipril 2.5 Mg Caps (Ramipril) .[name]... Take one capsule by mouth twice a day    Warfarin Sodium 2.5 Mg Tabs (Warfarin sodium) ..... Use as directed by anticoagulation clinic  Patient Instructions: 1)  Your physician has recommended you make the following change in  your medication: Increase Amiodarone to 400mg  twice daily for 2 weeks then 400mg  daily. 2)  If you don't hear from [facility] or [facility] by May 2nd, call the office to follow-up. 3)  Your physician recommends that you schedule a follow-up appointment in: 4 weeks with Dr Graciela Husbands. Prescriptions: AMIODARONE HCL 400 MG TABS (AMIODARONE HCL) Take one tablet by mouth twice a dayfor 2 weeks, then take one tablet by mouth daily  #42 x 11   Entered by:   Optometrist BSN   Authorized by:   Nathen May, MD, [facility]   Signed by:   Gypsy Balsam RN BSN on 08/19/2009   Method used:   [facility]       [address]       [place], [place]  [address]       Ph: [phone]       Fax: [phone]   RxID:   [id number]   Appended Document: [facility]     Allergies: No Known Drug Allergies    ICD Specifications Following MD:  Sherryl Manges, MD     ICD Vendor:  Medtronic     ICD Model Number:  [id number]     ICD Serial Number:  [id number] ICD DOI:  12/17/2008     ICD Implanting MD:  Sherryl Manges, MD  Lead 1:    Location: RA     DOI: 10/28/2003     Model #: 6940     Serial #: [id number]     Status: active Lead 2:    Location: RV     DOI: 10/28/2003     Model #: 6283     Serial #: [id number]     Status: active Lead 3:    Location: LV  DOI: 12/17/2008     Model #: 1258T     Serial #: [id number]     Status: active Lead 4:    Location: RV     DOI: 12/17/2008     Model #: 2725     Serial #: [id number]     Status: active  Indications::  VT  Explantation Comments: 12/17/2008 Intrinsic 7288/[id number] h explanted  ICD Follow Up ICD Dependent:  Yes       ICD Device Measurements Configuration: LV TIP TO LV RING  Episodes Coumadin:  Yes  Brady Parameters Mode DDDR     Lower Rate Limit:  60     Upper Rate Limit 90 PAV 130     Sensed AV Delay:  100  Tachy Zones VF:  162     VT:  222 FVT VIA VF     VT1:  95     Impression & Recommendations:  Problem # 1:   ICD -  MEDTRONIC CONCERTO CRT (ICD-V45.02) Device parameters and data were reviewed and no changes were made

## 2010-06-01 NOTE — Progress Notes (Signed)
[facility] [place]  Phone Note Refill Request Message from:  Patient on October 29, 2009 12:13 PM  Refills Requested: Medication #1:  FUROSEMIDE 40 MG TABS Take one tablet by mouth daily..   Supply Requested: [facility] [place] [facility] [phone]   Method Requested: Telephone to Pharmacy Initial call taken by: Migdalia Dk,  October 29, 2009 12:14 PM    Prescriptions: FUROSEMIDE 40 MG TABS (FUROSEMIDE) Take one tablet by mouth daily.  #90 x 3   Entered by:   Kem Parkinson   Authorized by:   Ferman Hamming, MD, [facility]   Signed by:   Kem Parkinson on 10/29/2009   Method used:   [facility]  [address] [facility]       [address]       [place], [place]  [address]       Ph: [phone]       Fax: [phone]   RxID:   [id number]

## 2010-06-01 NOTE — Letter (Signed)
Summary: Appointment - Missed  [facility] [place]  [address] [place] [address]   Phone: [phone]  Fax: [phone]     March 08, 2010 MRN: [id number]   [facility] [place] [address] [place], [place]  [address]   Dear Michael Krueger,  Our records indicate you missed your appointment on     03/08/10 [facility]IN CLINIC        It is very important that we reach you to reschedule this appointment. We look forward to participating in your health care needs. Please contact us at the number listed above at your earliest convenience to reschedule this appointment.     Sincerely,    Glass blower/designer

## 2010-06-01 NOTE — Medication Information (Signed)
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 no    Recent/future dental: no  Any missed doses?: no       Is patient compliant with meds? yes       Allergies: No Known Drug Allergies  Anticoagulation Management History:      The patient is taking warfarin and comes in today for a routine follow up visit.  Negative risk factors for bleeding include an age less than 61 years old.  The bleeding index is 'low risk'.  Positive CHADS2 values include History of CHF.  Negative CHADS2 values include Age > 44 years old.  The start date was 01/25/2000.  His last INR was 2.2 ratio.  Anticoagulation responsible provider: Dietrich Pates MD, Molly Maduro.  INR POC: 2.3.  Cuvette Lot#: [id number].  Exp: 10/11.    Anticoagulation Management Assessment/Plan:      The patient's current anticoagulation dose is Warfarin sodium 2.5 mg tabs: Use as directed by Anticoagulation Clinic.  The target INR is 2 - 3.  The next INR is due 06/22/2009.  Anticoagulation instructions were given to patient.  Results were reviewed/authorized by Vashti Hey, RN.  He was notified by Vashti Hey RN.         Prior Anticoagulation Instructions: INR 1.7 Increase coumadin to 1.25mg  once daily except 2.5mg  on Tuesdays, Thursdays and Saturdays  Current Anticoagulation Instructions: INR 2.3 Continue coumadin 1.25mg  once daily except 2.5mg  on Tuesdays, Thursdays and Saturdays

## 2010-06-01 NOTE — Letter (Signed)
Summary: Appointment - Missed  [facility] [place]  [address] [place] [address]   Phone: [phone]  Fax: [phone]     November 11, 2009 MRN: [id number]   [facility] [place] [address] [place], [place]  [address]   Dear Mr. Harris,  Our records indicate you missed your appointment on      11/11/09 COUMADIN CLINIC            It is very important that we reach you to reschedule this appointment. We look forward to participating in your health care needs. Please contact us at the number listed above at your earliest convenience to reschedule this appointment.     Sincerely,    Glass blower/designer

## 2010-06-01 NOTE — Letter (Signed)
Summary: Appointment - Missed  [facility]     [place], [place]    Phone:   Fax:      July 17, 2009 MRN: [id number]   [facility] [place] [address] [place], [place]  [address]   Dear Michael Krueger,  Our records indicate you missed your appointment on  07-17-2009 with  Dr. [name]  It is very important that we reach you to reschedule this appointment. We look forward to participating in your health care needs. Please contact us at the number listed above at your earliest convenience to reschedule this appointment.     Sincerely,      Lorne Skeens   [facility] Scheduling Team

## 2010-06-01 NOTE — Progress Notes (Signed)
Summary: pre meds for dental work  Phone Note Other Incoming   Caller: Licensed conveyancer [phone] or fax [phone] Summary of Call: Need to know if pt n need pre meds before dental work and need to know what kind can be faxed at number above. Initial call taken by: Judie Grieve,  March 18, 2010 2:58 PM  Follow-up for Phone Call        pt does not require antibiotics prior to dental procedures. note faxed to number provided Deliah Goody, RN  March 18, 2010 3:23 PM

## 2010-06-01 NOTE — Medication Information (Signed)
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 no    Recent/future dental: no  Any missed doses?: no       Is patient compliant with meds? yes       Allergies: No Known Drug Allergies  Anticoagulation Management History:      The patient is taking warfarin and comes in today for a routine follow up visit.  Negative risk factors for bleeding include an age less than 55 years old.  The bleeding index is 'low risk'.  Positive CHADS2 values include History of CHF.  Negative CHADS2 values include Age > 69 years old.  The start date was 01/25/2000.  His last INR was 2.2 ratio.  Anticoagulation responsible provider: Dietrich Pates MD, Molly Maduro.  INR POC: 2.2.  Cuvette Lot#: [id number].  Exp: 10/11.    Anticoagulation Management Assessment/Plan:      The patient's current anticoagulation dose is Warfarin sodium 2.5 mg tabs: Use as directed by Anticoagulation Clinic.  The target INR is 2 - 3.  The next INR is due 07/20/2009.  Anticoagulation instructions were given to patient.  Results were reviewed/authorized by Michael Hey, Michael Krueger.  He was notified by Michael Hey Michael Krueger.         Prior Anticoagulation Instructions: INR 2.3 Continue coumadin 1.25mg  once daily except 2.5mg  on Tuesdays, Thursdays and Saturdays  Current Anticoagulation Instructions: INR 2.2 Continue coumadin 1.25mg  once daily except 2.5mg  on Tuesdays, Thursdays and Saturdays

## 2010-06-01 NOTE — Letter (Signed)
Summary: Custom - Lipid  [facility], Main Office  [address]   [place], [place] [address]   Phone: [phone]  Fax: [phone]     Sep 09, 2009 MRN: [id number]   [facility] [place] [address] [place], [place]  [address]   Dear Mr. Bazinet,  We have reviewed your cholesterol results.  They are as follows:     Total Cholesterol:    131 (Desirable: less than 200)       HDL  Cholesterol:     32.00  (Desirable: greater than 40 for men and 50 for women)       LDL Cholesterol:       74  (Desirable: less than 100 for low risk and less than 70 for moderate to high risk)       Triglycerides:       125.0  (Desirable: less than 150)  Our recommendations include:These numbers look good. Continue on the same medicine. Sodium, potassium, blood count, thyroid, kidney and  Liver function are normal. The chest xray was also normal. Take care, Dr. Darel Hong.    Call our office at the number listed above if you have any questions.  Lowering your LDL cholesterol is important, but it is only one of a large number of "risk factors" that may indicate that you are at risk for heart disease, stroke or other complications of hardening of the arteries.  Other risk factors include:   A.  Cigarette Smoking* B.  High Blood Pressure* C.  Obesity* D.   Low HDL Cholesterol (see yours above)* E.   Diabetes Mellitus (higher risk if your is uncontrolled) F.  Family history of premature heart disease G.  Previous history of stroke or cardiovascular disease    *These are risk factors YOU HAVE CONTROL OVER.  For more information, visit .  There is now evidence that lowering the TOTAL CHOLESTEROL AND LDL CHOLESTEROL can reduce the risk of heart disease.  The American Heart Association recommends the following guidelines for the treatment of elevated cholesterol:  1.  If there is now current heart disease and less than two risk factors, TOTAL CHOLESTEROL should be less than 200 and  LDL CHOLESTEROL should be less than 100. 2.  If there is current heart disease or two or more risk factors, TOTAL CHOLESTEROL should be less than 200 and LDL CHOLESTEROL should be less than 70.  A diet low in cholesterol, saturated fat, and calories is the cornerstone of treatment for elevated cholesterol.  Cessation of smoking and exercise are also important in the management of elevated cholesterol and preventing vascular disease.  Studies have shown that 30 to 60 minutes of physical activity most days can help lower blood pressure, lower cholesterol, and keep your weight at a healthy level.  Drug therapy is used when cholesterol levels do not respond to therapeutic lifestyle changes (smoking cessation, diet, and exercise) and remains unacceptably high.  If medication is started, it is important to have you levels checked periodically to evaluate the need for further treatment options.  Thank you,    [employer] Team

## 2010-06-01 NOTE — Medication Information (Signed)
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 yes       Details: Decr Amiodarone from 400mg  to 300mg . Pt currently on PCN for abcess tooth x 2-3 days.    Recent/future dental: yes     Details: Needs tooth extracted after abcess cleared up with abx.   Any missed doses?: no       Is patient compliant with meds? yes       Allergies: No Known Drug Allergies  Anticoagulation Management History:      The patient is taking warfarin and comes in today for a routine follow up visit.  Negative risk factors for bleeding include an age less than 24 years old.  The bleeding index is 'low risk'.  Positive CHADS2 values include History of CHF.  Negative CHADS2 values include Age > 26 years old.  The start date was 01/25/2000.  His last INR was 2.2 ratio.  Anticoagulation responsible provider: Daleen Squibb MD, Maisie Fus.  INR POC: 2.8.  Cuvette Lot#: [id number].  Exp: 01/2010.    Anticoagulation Management Assessment/Plan:      The patient's current anticoagulation dose is Warfarin sodium 2.5 mg tabs: Use as directed by Anticoagulation Clinic.  The target INR is 2 - 3.  The next INR is due 01/28/2010.  Anticoagulation instructions were given to patient.  Results were reviewed/authorized by Cloyde Reams, RN, BSN.  He was notified by Cloyde Reams RN, BSN.         Prior Anticoagulation Instructions: INR 3.6 Hold coumadin tonight then decrease dose to 1.25mg  once daily except 2.5mg  on Tuesdays and Thursdays   Current Anticoagulation Instructions: INR 2.8  Continue on same dosage 1/2 tablet daily  except 1 tablet on Tuesdays and Thursdays.  Recheck in 4 weeks.

## 2010-06-01 NOTE — Medication Information (Signed)
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 yes       Details: On amiodarone 200mg  bid per Dr Graciela Husbands  Was on 800mg  x 2 wks  Recent/future dental: no  Any missed doses?: no       Is patient compliant with meds? yes       Allergies: No Known Drug Allergies  Anticoagulation Management History:      The patient is taking warfarin and comes in today for a routine follow up visit.  Negative risk factors for bleeding include an age less than 48 years old.  The bleeding index is 'low risk'.  Positive CHADS2 values include History of CHF.  Negative CHADS2 values include Age > 52 years old.  The start date was 01/25/2000.  His last INR was 2.2 ratio.  Anticoagulation responsible provider: Diona Browner MD, Remi Deter.  INR POC: 2.9.  Exp: 10/11.    Anticoagulation Management Assessment/Plan:      The patient's current anticoagulation dose is Warfarin sodium 2.5 mg tabs: Use as directed by Anticoagulation Clinic.  The target INR is 2 - 3.  The next INR is due 10/08/2009.  Anticoagulation instructions were given to patient.  Results were reviewed/authorized by Vashti Hey, RN.  He was notified by Vashti Hey RN.        Coagulation management information includes: On amiodarone 200mg  two times a day .  Prior Anticoagulation Instructions: INR 2.5 Continue coumadin 1.25mg  once daily except 2.5mg  on T,Th,Sat  Current Anticoagulation Instructions: INR 2.9 Continue coumadin 1.25mg  once daily except 2.5mg  on Tuesdays, Thursdays and Saturdays

## 2010-06-01 NOTE — Medication Information (Signed)
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 no    Recent/future dental: no  Any missed doses?: no       Is patient compliant with meds? yes       Allergies: No Known Drug Allergies  Anticoagulation Management History:      The patient is taking warfarin and comes in today for a routine follow up visit.  Negative risk factors for bleeding include an age less than 79 years old.  The bleeding index is 'low risk'.  Positive CHADS2 values include History of CHF.  Negative CHADS2 values include Age > 98 years old.  The start date was 01/25/2000.  His last INR was 2.2 ratio.  Anticoagulation responsible provider: Dietrich Pates MD, Molly Maduro.  INR POC: 2.2.  Cuvette Lot#: [id number].  Exp: 10/11.    Anticoagulation Management Assessment/Plan:      The patient's current anticoagulation dose is Warfarin sodium 2.5 mg tabs: Use as directed by Anticoagulation Clinic.  The target INR is 2 - 3.  The next INR is due 08/19/2009.  Anticoagulation instructions were given to patient.  Results were reviewed/authorized by Vashti Hey, RN.  He was notified by Vashti Hey RN.         Prior Anticoagulation Instructions: INR 2.2 Continue coumadin 1.25mg  once daily except 2.5mg  on Tuesdays, Thursdays and Saturdays  Current Anticoagulation Instructions: Same as Prior Instructions.

## 2010-06-01 NOTE — Letter (Signed)
Summary: Michael Krueger Electrophysiology New Patient   [facility] Electrophysiology New Patient   Imported By: Roderic Ovens 01/13/2010 13:33:24  _____________________________________________________________________  External Attachment:    Type:   Image     Comment:   External Document

## 2010-06-01 NOTE — Progress Notes (Signed)
Summary: advise coumadin  & asa  Phone Note From Other Clinic   Caller: [facility] OFFICE  [phone] / FAX [phone] Request: Talk with Nurse Summary of Call: Pt on asa, coumadin pls advise regarding tooth extractions.  Initial call taken by: Lorne Skeens,  March 24, 2010 12:10 PM  Follow-up for Phone Call        spoke with [facility] at [facility], pt is needing two teeth extracted. they would like to know if pt can hold asa and coumadin for the extraction. they also want to make sure pt does not need antibiotics prior to procedure. will foward for dr [name] review Deliah Goody, RN  March 24, 2010 2:41 PM   Additional Follow-up for Phone Call Additional follow up Details #1::        ok to hold coumadin and resume after; continue asa; no sbe Ferman Hamming, MD, [facility]  March 25, 2010 11:28 AM      Appended Document: advise coumadin  & asa faxed to number provided

## 2010-06-01 NOTE — Medication Information (Signed)
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 no    Recent/future dental: no  Any missed doses?: no       Is patient compliant with meds? yes       Allergies: No Known Drug Allergies  Anticoagulation Management History:      The patient is taking warfarin and comes in today for a routine follow up visit.  Negative risk factors for bleeding include an age less than 43 years old.  The bleeding index is 'low risk'.  Positive CHADS2 values include History of CHF.  Negative CHADS2 values include Age > 38 years old.  The start date was 01/25/2000.  His last INR was 2.2 ratio.  Anticoagulation responsible provider: Dietrich Pates MD, Molly Maduro.  INR POC: 2.3.  Cuvette Lot#: [id number].  Exp: 10/11.    Anticoagulation Management Assessment/Plan:      The patient's current anticoagulation dose is Warfarin sodium 2.5 mg tabs: Use as directed by Anticoagualtion Clinic.  The target INR is 2 - 3.  The next INR is due 03/17/2009.  Anticoagulation instructions were given to patient.  Results were reviewed/authorized by Vashti Hey, RN.  He was notified by Vashti Hey RN.         Prior Anticoagulation Instructions: INR 1.9 today Increase coumadin to 1.25mg  once daily except 2.5mg  on Wednesdays and Saturdays  Current Anticoagulation Instructions: INR 2.3 Continue coumadin 1.25mg  once daily except 2.5mg  on Wednesdays and Saturdays  Appended Document: ccr-lr F/U appt is 04/16/09 not 03/17/09

## 2010-06-01 NOTE — Medication Information (Signed)
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 no    Recent/future dental: no  Any missed doses?: no       Is patient compliant with meds? yes       Allergies: No Known Drug Allergies  Anticoagulation Management History:      The patient is taking warfarin and comes in today for a routine follow up visit.  Negative risk factors for bleeding include an age less than 59 years old.  The bleeding index is 'low risk'.  Positive CHADS2 values include History of CHF.  Negative CHADS2 values include Age > 98 years old.  The start date was 01/25/2000.  His last INR was 2.2 ratio.  Anticoagulation responsible provider: Diona Browner MD, Remi Deter.  INR POC: 2.5.  Cuvette Lot#: [id number].  Exp: 10/11.    Anticoagulation Management Assessment/Plan:      The patient's current anticoagulation dose is Warfarin sodium 2.5 mg tabs: Use as directed by Anticoagulation Clinic.  The target INR is 2 - 3.  The next INR is due 09/17/2009.  Anticoagulation instructions were given to patient.  Results were reviewed/authorized by Vashti Hey, RN.  He was notified by Vashti Hey RN.         Prior Anticoagulation Instructions: INR 2.2 Continue coumadin 1.25mg  once daily except 2.5mg  on Tuesdays, Thursdays and Saturdays  Current Anticoagulation Instructions: INR 2.5 Continue coumadin 1.25mg  once daily except 2.5mg  on T,Th,Sat

## 2010-06-01 NOTE — Letter (Signed)
Summary: Appointment - Missed  [facility], Main Office  [address]   [place], [place] [address]   Phone: [phone]  Fax: [phone]     June 23, 2009 MRN: [id number]   [facility] [place] [address] [place], [place]  [address]   Dear Michael Krueger,  Our records indicate you missed your appointment on 06/09/09 with Dr. Graciela Husbands. It is very important that we reach you to reschedule this appointment. We look forward to participating in your health care needs. Please contact us at the number listed above at your earliest convenience to reschedule this appointment.     Sincerely,   Ruel Favors Scheduling Team

## 2010-06-01 NOTE — Assessment & Plan Note (Signed)
Summary: ROV/per pt call/jss   CC:  no complaints.  History of Present Illness: Michael Krueger is a pleasant gentleman with a history of coronary artery disease, ischemic cardiomyopathy, history of ICD, history of ventricular tachycardia status post ablation, brief history of atrial fibrillation on amiodarone therapy.  His last echocardiogram was performed in July of 2010 and revealed an EF of 20-25 and mild MR. Last cath performed in July of 2010 and revealed an occluded RCA but nonobstructive disease in the Lcx and LAD other than ostial 80 in a small marginal; EF 10-15.  Since then, he has had CRT and VT ablations. Symptomatically he is doing well. There is dyspnea with more extreme activities but not routine activites relieved with rest. No chest pain, palpitations, or syncope, and there is no pedal edema. No episodes of dizziness.  Current Medications (verified): 1)  Lipitor 80 Mg Tabs (Atorvastatin Calcium) .... Take One Tablet By Mouth Daily. 2)  Amiodarone Hcl 200 Mg Tabs (Amiodarone Hcl) .[name].. 1 Tab By Mouth Two Times A Day 3)  Carvedilol 12.5 Mg Tabs (Carvedilol) .... Take One Tablet By Mouth Twice A Day 4)  Spironolactone 25 Mg Tabs (Spironolactone) .... Take One Tablet By Mouth Daily 5)  Aspirin 81 Mg Tbec (Aspirin) .... Take One Tablet By Mouth Daily 6)  Lanoxin 0.125 Mg Tabs (Digoxin) .... Once Daily 7)  Niaspan 1000 Mg Cr-Tabs (Niacin (Antihyperlipidemic)) .... Once Daily 8)  Ramipril 2.5 Mg Caps (Ramipril) .... Take One Capsule By Mouth Twice A Day 9)  Warfarin Sodium 2.5 Mg Tabs (Warfarin Sodium) .... Use As Directed By Anticoagulation Clinic 10)  Furosemide 40 Mg Tabs (Furosemide) .... Take One Tablet By Mouth Daily.  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 Retired  Disabled  Tobacco Use - Yes. quit Alcohol Use - yes Drug Use - no  Review of Systems       Occasional mild pedal edema but no fevers or chills, productive cough, hemoptysis, dysphasia, odynophagia, melena, hematochezia, dysuria, hematuria, rash, seizure activity, orthopnea, PND,  claudication. Remaining systems are negative.   Vital Signs:  Patient profile:   55 year old male Height:      62 inches Weight:      170 pounds BMI:     31.21 Pulse rate:   74 / minute Resp:     14 per minute BP sitting:   116 / 79  (left arm)  Vitals Entered By: Michael Krueger (Sep 07, 2009 8:54 AM)  Physical Exam  General:  Well-developed well-nourished in no acute distress.  Skin is warm and dry.  HEENT is normal.  Neck is supple. No thyromegaly.  Chest is clear to auscultation with normal expansion. ICD left chest Cardiovascular exam is regular rate and rhythm.  Abdominal exam nontender or distended. No masses palpated. Extremities show no edema. neuro grossly intact    EKG  Procedure date:  09/07/2009  Findings:      AV paced rhythm.   ICD  Specifications Following MD:  Sherryl Manges, MD     ICD Vendor:  Medtronic     ICD Model Number:  [phone]     ICD Serial Number:  [id number] ICD DOI:  12/17/2008     ICD Implanting MD:  Sherryl Manges, MD  Lead 1:    Location: RA     DOI: 10/28/2003     Model #: 6940     Serial #: [id number]     Status: active Lead 2:    Location: RV     DOI: 10/28/2003      Model #: 1308     Serial #: [id number]     Status: active Lead 3:    Location: LV     DOI: 12/17/2008     Model #: 1258T     Serial #: [id number]     Status: active Lead 4:    Location: RV     DOI: 12/17/2008     Model #: 4010     Serial #: [id number]     Status: active  Indications::  VT  Explantation Comments: 12/17/2008 Intrinsic 7288/[id number] h explanted  ICD Follow Up ICD Dependent:  Yes       ICD Device Measurements Configuration: LV TIP TO LV RING  Episodes Coumadin:  Yes  Brady Parameters Mode DDDR     Lower Rate Limit:  60     Upper Rate Limit 90 PAV 130     Sensed AV Delay:  100  Tachy Zones VF:  162     VT:  222 FVT VIA VF     VT1:  95     Impression & Recommendations:  Problem # 1:  ATRIAL FIBRILLATION (ICD-427.31) Patient in AV paced rhythm; continue amiodarone and coumadin; check TSH, LFTs and chest xray. His updated medication list for this problem includes:    Amiodarone Hcl 200 Mg Tabs (Amiodarone hcl) .[name]... 1 tab by mouth two times a day    Carvedilol 12.5 Mg Tabs (Carvedilol) .[name]... Take one tablet by mouth twice a day    Aspirin 81 Mg Tbec (Aspirin) .[name]... Take one tablet by mouth daily    Lanoxin 0.125 Mg Tabs (Digoxin) ..... Once daily    Warfarin Sodium 2.5 Mg Tabs (Warfarin sodium) ..... Use as directed by anticoagulation clinic  Orders: TLB-CBC Platelet - w/Differential (85025-CBCD) TLB-TSH (Thyroid Stimulating Hormone) (84443-TSH) T-2 View CXR (71020TC)  Problem # 2:  HYPERLIPIDEMIA-MIXED (ICD-272.4) Check lipids and liver. His updated medication list for this problem includes:    Lipitor 80 Mg Tabs (Atorvastatin calcium) .[name]... Take one tablet by mouth daily.    Niaspan 1000 Mg Cr-tabs (Niacin (antihyperlipidemic)) ..... Once daily  Orders: TLB-Hepatic/Liver Function Pnl (80076-HEPATIC) TLB-Lipid Panel (80061-LIPID)  Problem # 3:  SYSTOLIC HEART FAILURE, CHRONIC (ICD-428.22) Euvolemic; continue coreg, ace-I, and diuretics; check bmet. His  updated medication list for this problem includes:    Amiodarone Hcl 200 Mg Tabs (Amiodarone hcl) .[name]... 1 tab by mouth two times a day    Carvedilol 12.5 Mg Tabs (Carvedilol) .[name]... Take one tablet by mouth twice a day    Spironolactone 25 Mg Tabs (Spironolactone) .[name]... Take one tablet by mouth daily    Aspirin 81 Mg Tbec (Aspirin) .[name]... Take one tablet by mouth daily    Lanoxin 0.125 Mg Tabs (Digoxin) ..... Once daily    Ramipril 2.5 Mg Caps (Ramipril) .[name]... Take one capsule by mouth twice a day    Warfarin Sodium 2.5 Mg Tabs (Warfarin sodium) ..... Use as  directed by anticoagulation clinic    Furosemide 40 Mg Tabs (Furosemide) .[name]... Take one tablet by mouth daily.  Problem # 4:  VENTRICULAR TACHYCARDIA (ICD-427.1) Continue amiodarone; Dr. Graciela Husbands has referred to [facility] for ablation. His updated medication list for this problem includes:    Amiodarone Hcl 200 Mg Tabs (Amiodarone hcl) .[name]... 1 tab by mouth two times a day    Carvedilol 12.5 Mg Tabs (Carvedilol) .[name]... Take one tablet by mouth twice a day    Aspirin 81 Mg Tbec (Aspirin) .[name]... Take one tablet by mouth daily    Ramipril 2.5 Mg Caps (Ramipril) .[name]... Take one capsule by mouth twice a day    Warfarin Sodium 2.5 Mg Tabs (Warfarin sodium) ..... Use as directed by anticoagulation clinic  Problem # 5:  ICD -  MEDTRONIC CONCERTO CRT (ICD-V45.02) Management per EP  Problem # 6:  COUMADIN THERAPY (ICD-V58.61) Goal INR 2-3. Check CBC.  Problem # 7:  CAD (ICD-414.00) Continue ASA, coreg, ace-I and statin. His updated medication list for this problem includes:    Carvedilol 12.5 Mg Tabs (Carvedilol) .[name]... Take one tablet by mouth twice a day    Aspirin 81 Mg Tbec (Aspirin) .[name]... Take one tablet by mouth daily    Ramipril 2.5 Mg Caps (Ramipril) .[name]... Take one capsule by mouth twice a day    Warfarin Sodium 2.5 Mg Tabs (Warfarin sodium) ..... Use as directed by anticoagulation clinic  Other Orders: TLB-BMP (Basic Metabolic Panel-BMET)  (80048-METABOL)  Patient Instructions: 1)  Your physician recommends that you schedule a follow-up appointment in: 6 months

## 2010-06-01 NOTE — Letter (Signed)
Summary: Custom - Delinquent Coumadin 1  [facility]  [address] [place] [address]   Phone: [phone]  Fax: [phone]     December 16, 2009 MRN: [id number]   [facility] [place] [address] [place], [place]  [address]   Dear Mr. Mothershead,  This letter is being sent to you as a reminder that it is necessary for you to get your INR/PT checked regularly so that we can optimize your care.  Our records indicate that you were scheduled to have a test done recently.  As of today, we have not received the results of this test.  It is very important that you have your INR checked.  Please call our office at the number listed above to schedule an appointment at your earliest convenience.    If you have recently had your protime checked or have discontinued this medication, please contact our office at the above phone number to clarify this issue.  Thank you for this prompt attention to this important health care matter.  Sincerely, Vashti Hey RN  [facility] Cardiovascular Risk Reduction Clinic Team

## 2010-06-01 NOTE — Assessment & Plan Note (Signed)
Summary: PER CHECK OUT/SF   Visit Type:  Follow-up  CC:  no complaints.  History of Present Illness:  Mr. Cumbie is seen in followup for ventricular tachycardia for which he underwent radiofrequency catheter ablation of ventricular tachycardia, February 06, 2009. He tolerated the procedure well and was discharged home.  He suddenly developed ecurrence of lightheadedness and shortness of breath which correlated with significant increased frequency of ventricular tachycardia both sustained and treated and nonsustained. There's been no significant chest discomfort. Exercise tolerance remains stable at class IIB Last cath performed in July of 2010 and revealed an occluded RCA but nonobstructive disease in the Lcx and LAD other than ostial 80 in a small marginal; EF 10-15.  [name]   11/04/2008 echo showed severe depression LV systolic function with ejection fraction 20-25%    at his last visit we increased his amiodarone which has been temporally associated with fewer symptoms. He also has a history of atrial fibrillation  He has noted that he has been much more prone to sunburn on the higher dose of amiodarone     Current Medications (verified): 1)  Lipitor 80 Mg Tabs (Atorvastatin Calcium) .... Take One Tablet By Mouth Daily. 2)  Amiodarone Hcl 200 Mg Tabs (Amiodarone Hcl) .[name].. 1 Tab By Mouth Two Times A Day 3)  Carvedilol 12.5 Mg Tabs (Carvedilol) .... Take One Tablet By Mouth Twice A Day 4)  Spironolactone 25 Mg Tabs (Spironolactone) .... Take One Tablet By Mouth Daily 5)  Aspirin 81 Mg Tbec (Aspirin) .... Take One Tablet By Mouth Daily 6)  Lanoxin 0.125 Mg Tabs (Digoxin) .... Once Daily 7)  Niaspan 1000 Mg Cr-Tabs (Niacin (Antihyperlipidemic)) .... Once Daily 8)  Ramipril 2.5 Mg Caps (Ramipril) .... Take One Capsule By Mouth Twice A Day 9)  Warfarin Sodium 2.5 Mg Tabs (Warfarin Sodium) .... Use As Directed By Anticoagulation Clinic 10)  Furosemide 40 Mg Tabs (Furosemide) .... Take One Tablet  By Mouth Daily.  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  Father alive and well.  He has  one brother with diabetes who is living.  One sister living with history of deep venous thrombosis.  Social History: Last updated: 03/04/2009 Retired  Disabled  Tobacco Use - Yes. quit Alcohol Use - yes Drug Use - no  Vital Signs:  Patient profile:   55 year old male Height:      62 inches Weight:      170 pounds BMI:     31.21 Pulse rate:   68 / minute Resp:     16 per minute BP sitting:   92 / 62  (right arm)  Vitals Entered By: Marrion Coy, CNA (Sep 24, 2009 3:46 PM)  Physical Exam  General:  The patient was alert and oriented in no acute distress. HEENT Normal.  Neck veins were flat, carotids were brisk.  Lungs were clear.  Heart sounds were regular without murmurs or gallops.  Abdomen was soft with active bowel sounds. There is no  clubbing cyanosis or edema. Skin Warm and dry, with sunburn over his arms and head     ICD Specifications Following MD:  Sherryl Manges, MD     ICD Vendor:  Medtronic     ICD Model Number:  [phone]     ICD Serial Number:  [id number] ICD DOI:  12/17/2008     ICD Implanting MD:  Sherryl Manges, MD  Lead 1:    Location: RA     DOI: 10/28/2003     Model  #: 6940     Serial #: [id number]     Status: active Lead 2:    Location: RV     DOI: 10/28/2003     Model #: 1308     Serial #: [id number]     Status: active Lead 3:    Location: LV     DOI: 12/17/2008     Model #: 1258T     Serial #: [id number]     Status: active Lead 4:    Location: RV     DOI: 12/17/2008     Model #: 4010     Serial #: [id number]     Status: active  Indications::  VT  Explantation Comments: 12/17/2008 Intrinsic 7288/[id number] h explanted  ICD Follow Up ICD Dependent:  Yes       ICD Device Measurements Configuration: LV TIP TO LV RING  Episodes Coumadin:  Yes  Brady Parameters Mode DDDR     Lower Rate Limit:  60     Upper Rate Limit 90 PAV 130     Sensed AV Delay:  100  Tachy Zones VF:  162     VT:  222 FVT VIA VF     VT1:  95     Impression & Recommendations:  Problem # 1:  AMIODARONE PHOTOSENSITIVITY (ICD-692.72) he is advised to use sunscreen as well as to get a widebrimmed pack  Problem # 2:  AV BLOCK, 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 1 tab by mouth two times a day    Carvedilol 12.5 Mg Tabs (Carvedilol) .[name]... Take one tablet by mouth twice a day    Aspirin 81 Mg Tbec (Aspirin) .[name]... Take one tablet by mouth daily    Ramipril 2.5 Mg Caps (Ramipril) .[name]... Take one capsule by mouth twice a day    Warfarin Sodium 2.5 Mg Tabs (Warfarin sodium) ..... Use as directed by anticoagulation clinic  Problem # 3:  VENTRICULAR TACHYCARDIA (ICD-427.1) He's had no treated ventricular tachycardia since his last visit. The frequency of nonsustained ventricular tachycardia is also markedly diminished  we will continue him on his current dose of amiodarone.  he is scheduled to see Dr. Macon Large at [facility] in about 2 months time for consideration of catheter ablation (repeat) of his ventricular tachycardia  Problem # 4:  CAD (ICD-414.00) stable on his current  medications. I am inclined to discontinue his aspirin will defer this to Dr. [name] His updated medication list for this problem includes:    Carvedilol 12.5 Mg Tabs (Carvedilol) .[name]... Take one tablet by mouth twice a day    Aspirin 81 Mg Tbec (Aspirin) .[name]... Take one tablet by mouth daily    Ramipril 2.5 Mg Caps (Ramipril) .[name]... Take one capsule by mouth twice a day    Warfarin Sodium 2.5 Mg Tabs (Warfarin sodium) ..... Use as directed by anticoagulation clinic  Problem # 5:  ICD -  MEDTRONIC CONCERTO CRT (ICD-V45.02) Device parameters and data were reviewed and no changes were made  Patient Instructions: 1)  Your physician recommends that you schedule a follow-up appointment in: 3 months

## 2010-06-01 NOTE — Letter (Signed)
Summary: Appointment - Missed  [facility]  [address], [place] [address]   Phone: [phone]  Fax: [phone]     October 08, 2009 MRN: [id number]   [facility] [place] [address] [place], [place]  [address]   Dear Mr. Tober,  Our records indicate you missed your appointment on        10/08/09 COUM[facility]IN CLINIC         It is very important that we reach you to reschedule this appointment. We look forward to participating in your health care needs. Please contact us at the number listed above at your earliest convenience to reschedule this appointment.     Sincerely,    Glass blower/designer

## 2010-06-01 NOTE — Progress Notes (Signed)
Summary: Calling about appt dealing with defib  Phone Note Call from Patient Call back at [phone]   Caller: Patient Summary of Call: Pt calling regarding some appts dealing with his defib. Initial call taken by: Judie Grieve,  Sep 01, 2009 10:16 AM  Follow-up for Phone Call        09/01/09--spoke with pt and dr Earlie Server to pt dr Graciela Husbands will call him when he hears back from MD at [facility] about sched procedure--advised pt to call back 09/07/09 if he has not heard from us--pt agrees--nt Follow-up by: Ledon Snare, RN,  Sep 01, 2009 3:19 PM

## 2010-06-01 NOTE — Progress Notes (Signed)
Summary: Pt calling regarding [facility] appt  Phone Note Call from Patient Call back at Home Phone [phone] Call back at [phone]   Caller: Patient Summary of Call: Pt calling regarding [facility] appt Initial call taken by: Judie Grieve,  Sep 14, 2009 9:09 AM  Follow-up for Phone Call        Attempted to call about appt [phone])-- [facility] numberOlegario Messier with Dr Macon Large.  No answer.  Will try again. Gypsy Balsam RN BSN  Sep 15, 2009 5:56 PM    spoke with Olegario Messier, appt made for pt 11-09-09 @ 9am. Olegario Messier states she will be able to get him in sooner and will call the pt. spoke to pt he is aware of the appt Deliah Goody, RN  Sep 16, 2009 11:38 AM

## 2010-06-01 NOTE — Medication Information (Signed)
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 no    Recent/future dental: no  Any missed doses?: no       Is patient compliant with meds? yes       Allergies: No Known Drug Allergies  Anticoagulation Management History:      The patient is taking warfarin and comes in today for a routine follow up visit.  Negative risk factors for bleeding include an age less than 62 years old.  The bleeding index is 'low risk'.  Positive CHADS2 values include History of CHF.  Negative CHADS2 values include Age > 74 years old.  The start date was 01/25/2000.  His last INR was 2.2 ratio.  Anticoagulation responsible provider: Daleen Squibb MD, Maisie Fus.  INR POC: 2.9.  Cuvette Lot#: [id number].  Exp: 01/2010.    Anticoagulation Management Assessment/Plan:      The patient's current anticoagulation dose is Warfarin sodium 2.5 mg tabs: Use as directed by Anticoagulation Clinic.  The target INR is 2 - 3.  The next INR is due 02/25/2010.  Anticoagulation instructions were given to patient.  Results were reviewed/authorized by Vashti Hey, RN.  He was notified by Vashti Hey RN.         Prior Anticoagulation Instructions: INR 2.8  Continue on same dosage 1/2 tablet daily except 1 tablet on Tuesdays and Thursdays.  Recheck in 4 weeks.    Current Anticoagulation Instructions: INR 2.9 Continue coumadin 1.25mg  once daily except 2.5mg  on Tuesdays and Thursdays

## 2010-06-01 NOTE — Medication Information (Signed)
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 no    Recent/future dental: no  Any missed doses?: no       Is patient compliant with meds? yes       Allergies: No Known Drug Allergies  Anticoagulation Management History:      The patient is taking warfarin and comes in today for a routine follow up visit.  Negative risk factors for bleeding include an age less than 59 years old.  The bleeding index is 'low risk'.  Positive CHADS2 values include History of CHF.  Negative CHADS2 values include Age > 54 years old.  The start date was 01/25/2000.  His last INR was 2.2 ratio.  Anticoagulation responsible provider: Diona Browner MD, Remi Deter.  INR POC: 3.6.  Cuvette Lot#: [id number].  Exp: 10/11.    Anticoagulation Management Assessment/Plan:      The patient's current anticoagulation dose is Warfarin sodium 2.5 mg tabs: Use as directed by Anticoagulation Clinic.  The target INR is 2 - 3.  The next INR is due 11/11/2009.  Anticoagulation instructions were given to patient.  Results were reviewed/authorized by Vashti Hey, RN.  He was notified by Vashti Hey RN.         Prior Anticoagulation Instructions: INR 2.9 Continue coumadin 1.25mg  once daily except 2.5mg  on Tuesdays, Thursdays and Saturdays  Current Anticoagulation Instructions: INR 3.6 Hold coumadin tonight then decrease dose to 1.25mg  once daily except 2.5mg  on Tuesdays and Thursdays

## 2010-06-01 NOTE — Letter (Signed)
Summary: Device-Delinquent Phone Journalist, newspaper, Main Office  [address]   [place], [place] [address]   Phone: [phone]  Fax: [phone]     April 02, 2010 MRN: [id number]   [facility] [place] [address] [place], [place]  [address]   Dear Mr. Lansky,  According to our records, you were scheduled for a device phone transmission on 04-01-2010.     We did not receive any results from this check.  If you transmitted on your scheduled day, please call us to help troubleshoot your system.  If you forgot to send your transmission, please send one upon receipt of this letter.  Thank you,   Architectural technologist Device Clinic

## 2010-06-01 NOTE — Progress Notes (Signed)
Summary: Meds   Phone Note Call from Patient Call back at [phone]   Caller: Patient Reason for Call: Talk to Nurse Summary of Call: PT WOULD TO KNOW IF YOU CAN CALL IN Antibiotics AND PAIN MEDICATION FOR A TOOTH. PT DOES NOT HAVE PCP OR DENTIST AT TIME. [id number] Initial call taken by: Roe Coombs,  March 16, 2010 12:53 PM  Follow-up for Phone Call        Phone Call Completed SPOKE WITH PT'S SON  DAD HAS ABCESS AND NO PMD OR DENTIST WOULD LIKE ANTIBIOTIC AND PAIN MED . Follow-up by: Scherrie Bateman, LPN,  March 16, 2010 1:50 PM  Additional Follow-up for Phone Call Additional follow up Details #1::        If he has abscess, needs to be seen by primary care physician or dentist Ferman Hamming, MD, [facility]  March 16, 2010 1:54 PM  [facility] Scherrie Bateman, LPN  March 16, 2010 5:04 PM   SON AWARE FOR DAD TO SEE PMD OR DENTIST FOR ABOVE PROBLEM. Additional Follow-up by: Scherrie Bateman, LPN,  March 17, 2010 11:15 AM

## 2010-06-01 NOTE — Assessment & Plan Note (Signed)
Summary: 3 MONTH RETURN.[name]   History of Present Illness:  [name] is seen in followup for ventricular tachycardia for which he underwent radiofrequency catheter ablation of ventricular tachycardia, February 06, 2009. He has done well without recurrence of clinical tachycardia.  He denies significant chest pain any worsening shortness of breath or peripheral edema.  Last cath performed in July of 2010 and revealed an occluded RCA but nonobstructive disease in the Lcx and LAD other than ostial 80 in a small marginal; EF 10-15.  Michael Krueger   11/04/2008 echo showed severe depression LV systolic function with ejection fraction 20-25%    at his last visit we increased his amiodarone which has been temporally associated with fewer symptoms. He also has a history of atrial fibrillation  He has noted that he has been much more prone to sunburn on the higher dose of amiodarone; surveillance laboratories obtained in May by Dr. [name] were reviewed and were normal     Physical Exam  General:  The patient was alert and oriented in no acute distress. HEENT Normal.  Neck veins were flat, carotids were brisk.  Lungs were clear.  Heart sounds were regular without murmurs or gallops.  Abdomen was soft with active bowel sounds. There is no clubbing cyanosis or edema. Skin Warm and dry    Current Medications (verified): 1)  Lipitor 80 Mg Tabs (Atorvastatin Calcium) .... Take One Tablet By Mouth Daily. 2)  Amiodarone Hcl 200 Mg Tabs (Amiodarone Hcl) .Michael Krueger.. 1 Tab By Mouth Two Times A Day 3)  Carvedilol 12.5 Mg Tabs (Carvedilol) .... Take One Tablet By Mouth Twice A Day 4)  Spironolactone 25 Mg Tabs (Spironolactone) .... Take One Tablet By Mouth Daily 5)  Aspirin 81 Mg Tbec (Aspirin) .... Take One Tablet By Mouth Daily 6)  Lanoxin 0.125 Mg Tabs (Digoxin) .... Once Daily 7)  Niaspan 1000 Mg Cr-Tabs (Niacin (Antihyperlipidemic)) .... Once Daily 8)  Ramipril 2.5 Mg Caps (Ramipril) .... Take One Capsule By  Mouth Twice A Day 9)  Warfarin Sodium 2.5 Mg Tabs (Warfarin Sodium) .... Use As Directed By Anticoagulation Clinic 10)  Furosemide 40 Mg Tabs (Furosemide) .... Take One Tablet By Mouth Daily. 11)  Penicillin V Potassium 500 Mg Tabs (Penicillin V Potassium) .... Take One Tablet Four Times Daily For 10 Days 12)  Hydrocodone-Acetaminophen 5-500 Mg Tabs (Hydrocodone-Acetaminophen) .... 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  Father alive and well.  He has  one brother with diabetes who is living.  One sister living with history of deep venous thrombosis.  Social History: Last updated: 03/04/2009 Retired  Disabled  Tobacco Use - Yes. quit Alcohol Use - yes Drug Use - no  Vital Signs:  Patient profile:   55 year old male Height:      62 inches Weight:      174 pounds BMI:     31.94 Pulse rate:   62 / minute BP sitting:  116 / 76  (left arm) Cuff size:   regular  Vitals Entered By: Judithe Modest CMA (January 01, 2010 10:08 AM)    ICD Specifications Following MD:  Sherryl Manges, MD     ICD Vendor:  Medtronic     ICD Model Number:  [id number]     ICD Serial Number:  [id number] ICD DOI:  12/17/2008     ICD Implanting MD:  Sherryl Manges, MD  Lead  1:    Location: RA     DOI: 10/28/2003     Model #: 6940     Serial #: [id number]     Status: active Lead 2:    Location: RV     DOI: 10/28/2003     Model #: 1308     Serial #: [id number]     Status: active Lead 3:    Location: LV     DOI: 12/17/2008     Model #: 1258T     Serial #: [id number]     Status: active Lead 4:    Location: RV     DOI: 12/17/2008     Model #: 4010     Serial #: [id number]     Status: active  Indications::  VT  Explantation Comments: 12/17/2008 Intrinsic 7288/[id number] h explanted  ICD Follow Up Remote Check?  No Battery Voltage:  3.13 V     Charge Time:  9.1 seconds     ICD Dependent:  Yes       ICD Device Measurements Atrium:  Amplitude: 2.8 mV, Impedance: 532 ohms, Threshold: 1.0 V at 0.4 msec Right Ventricle:  Amplitude: 16.4 mV, Impedance: 627 ohms, Threshold: 1.0 V at 0.4 msec Left Ventricle:  Impedance: 855 ohms, Threshold: 1.0 V at 0.4 msec Configuration: LV TIP TO LV RING Shock Impedance: 44/58 ohms   Episodes MS Episodes:  0     Percent Mode Switch:  0     Coumadin:  Yes Shock:  0     ATP:  0     Nonsustained:  12     Atrial Pacing:  100%     Ventricular Pacing:  100%  Brady Parameters Mode DDDR     Lower Rate Limit:  60     Upper Rate Limit 90 PAV 130     Sensed AV Delay:  100  Tachy Zones VF:  162     VT:  222 FVT VIA VF     VT1:  95     Next Remote Date:  04/01/2010     Next Cardiology Appt Due:  07/08/2010 Tech Comments:  No parameter changes.  Device function normal.  Optivol and throacic impedance normal.  Carelink transmission in 3 months with a ROV in 6 months with Dr. Graciela Husbands.  He will decrease his Amiodarone to 1 1/2 daily until he returns in 6 months.  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 1 tab by mouth two times a day    Carvedilol 12.5 Mg Tabs (Carvedilol) .Michael Krueger... Take one tablet by mouth twice a day    Aspirin 81 Mg Tbec (Aspirin) .Michael Krueger... Take one tablet by mouth daily    Ramipril 2.5 Mg Caps (Ramipril) .Michael Krueger... Take one capsule by mouth twice  a day    Warfarin Sodium 2.5 Mg Tabs (Warfarin sodium) ..... Use as directed by anticoagulation clinic  Problem # 3:  ATRIAL FIBRILLATION (ICD-427.31)  no recurrences of atrial fibrillation; we will decrease his amiodarone to one pill a day His updated medication list for this problem includes:    Amiodarone Hcl 200 Mg Tabs (Amiodarone hcl) .Michael Krueger... 1 tab by mouth two times a day    Carvedilol 12.5 Mg Tabs (Carvedilol) .Michael Krueger... Take one tablet by mouth twice a day    Aspirin 81 Mg Tbec (Aspirin) .Michael Krueger... Take one tablet by mouth daily    Lanoxin 0.125 Mg Tabs (Digoxin) ..... Once daily    Warfarin Sodium 2.5 Mg Tabs (Warfarin sodium) ..... Use as directed by anticoagulation clinic  Orders: EKG w/ Interpretation (93000)  Problem # 4:  CHF (ICD-428.0) stable on current His updated medication list for this problem includes:    Amiodarone Hcl 200 Mg Tabs (Amiodarone hcl) .Michael Krueger... 1 tab by mouth two times a day    Carvedilol 12.5 Mg Tabs (Carvedilol) .Michael Krueger... Take one tablet by mouth twice a day    Spironolactone 25 Mg Tabs (Spironolactone) .Michael Krueger... Take one tablet by mouth daily    Aspirin 81 Mg Tbec (Aspirin) .Michael Krueger... Take one tablet by mouth daily    Lanoxin 0.125 Mg Tabs (Digoxin) ..... Once daily    Ramipril 2.5 Mg Caps (Ramipril) .Michael Krueger... Take one capsule by mouth twice a day    Warfarin Sodium 2.5 Mg Tabs (Warfarin sodium) ..... Use as directed by anticoagulation clinic    Furosemide 40 Mg Tabs (Furosemide) .Michael Krueger... Take one tablet by mouth daily.  Problem # 5:  VENTRICULAR TACHYCARDIA (ICD-427.1) no recurrent ventricular tachycardia His updated medication list for this problem includes:    Amiodarone Hcl 200 Mg Tabs (Amiodarone  hcl) .Michael Krueger... 1 tab by mouth two times a day    Carvedilol 12.5 Mg Tabs (Carvedilol) .Michael Krueger... Take one tablet by mouth twice a day    Aspirin 81 Mg Tbec (Aspirin) .Michael Krueger... Take one tablet by mouth daily    Ramipril 2.5 Mg Caps (Ramipril) .Michael Krueger... Take one capsule by mouth twice a day    Warfarin Sodium 2.5 Mg Tabs (Warfarin sodium) ..... Use as directed by anticoagulation clinic  Problem # 6:  ICD -  MEDTRONIC CONCERTO CRT (ICD-V45.02) Device parameters and data were reviewed and no changes were made  Patient Instructions: 1)  Your physician recommends that you schedule a follow-up appointment in: 6 MONTHS WITH DR Graciela Husbands 2)  Your physician has recommended you make the following change in your medication: DECREASE AMIODARONE TO 300 MG once daily

## 2010-06-03 NOTE — Letter (Signed)
Summary: Remote Device Check  [employer], Main Office  [address]   [place], [place] [address]   Phone: [phone]  Fax: [phone]     May 12, 2010 MRN: [id number]   [facility] [place] [address] [place], [place]  [address]   Dear Mr. Fluellen,   Your remote transmission was recieved and reviewed by your physician.  All diagnostics were within normal limits for you.  ___X___Your next office visit is scheduled for:  March 2012 with Dr Graciela Husbands. Please call our office to schedule an appointment.    Sincerely,  Vella Kohler

## 2010-06-03 NOTE — Progress Notes (Signed)
Summary: question on device  Phone Note Call from Patient Call back at [phone]   Caller: Patient Reason for Call: Talk to Nurse Summary of Call: pt having trouble transmission Initial call taken by: Roe Coombs,  April 12, 2010 12:27 PM  Follow-up for Phone Call        Unable to send [facility] transmission.   Device infomation updated in [facility].  Return box and new transmitter to be sent to patient.  December transmission canceled and patient to call and schedule 3/12 appt with Dr. Graciela Husbands. Follow-up by: Altha Harm, LPN,  April 14, 2010 8:27 AM

## 2010-06-03 NOTE — Progress Notes (Signed)
Summary: PT WANTS TO MAKE SURE YOU GOT TRANSMISSION  Phone Note Call from Patient   Caller: Patient Reason for Call: Talk to Nurse, Talk to Doctor Summary of Call: PT WANTS TO MAKE SURE HIS TRANSMISSION WENT THROUGH YOU CAN REACH HIM AT [phone] Initial call taken by: Omer Jack,  April 21, 2010 1:35 PM  Follow-up for Phone Call        transmission recieved, left message for the patient. Follow-up by: Altha Harm, LPN,  April 23, 2010 8:05 AM

## 2010-06-03 NOTE — Cardiovascular Report (Signed)
Summary: Office Visit Remote   Office Visit Remote   Imported By: Roderic Ovens 05/14/2010 11:48:09  _____________________________________________________________________  External Attachment:    Type:   Image     Comment:   External Document

## 2010-06-29 ENCOUNTER — Encounter: Payer: Self-pay | Admitting: Cardiology

## 2010-06-29 ENCOUNTER — Ambulatory Visit (INDEPENDENT_AMBULATORY_CARE_PROVIDER_SITE_OTHER)
Admission: RE | Admit: 2010-06-29 | Discharge: 2010-06-29 | Disposition: A | Discharge status: Home / Self Care | Payer: Medicare Other | Source: Ambulatory Visit | Attending: Cardiology | Admitting: Cardiology

## 2010-06-29 ENCOUNTER — Ambulatory Visit (INDEPENDENT_AMBULATORY_CARE_PROVIDER_SITE_OTHER): Payer: Medicare Other | Admitting: Cardiology

## 2010-06-29 ENCOUNTER — Other Ambulatory Visit: Payer: Self-pay | Admitting: Cardiology

## 2010-06-29 DIAGNOSIS — I2589 Other forms of chronic ischemic heart disease: Secondary | ICD-10-CM

## 2010-06-29 DIAGNOSIS — E785 Hyperlipidemia, unspecified: Secondary | ICD-10-CM

## 2010-06-29 DIAGNOSIS — I509 Heart failure, unspecified: Secondary | ICD-10-CM

## 2010-06-29 DIAGNOSIS — I4891 Unspecified atrial fibrillation: Secondary | ICD-10-CM

## 2010-06-29 LAB — BASIC METABOLIC PANEL
CO2: 28 mEq/L (ref 19–32)
Calcium: 8.9 mg/dL (ref 8.4–10.5)
Chloride: 103 mEq/L (ref 96–112)
Glucose, Bld: 111 mg/dL — ABNORMAL HIGH (ref 70–99)
Sodium: 137 mEq/L (ref 135–145)

## 2010-06-29 LAB — CBC WITH DIFFERENTIAL/PLATELET
Basophils Absolute: 0 10*3/uL (ref 0.0–0.1)
Basophils Relative: 0.4 % (ref 0.0–3.0)
Eosinophils Absolute: 0 10*3/uL (ref 0.0–0.7)
HCT: 44.7 % (ref 39.0–52.0)
Hemoglobin: 15.4 g/dL (ref 13.0–17.0)
Lymphs Abs: 1.8 10*3/uL (ref 0.7–4.0)
MCHC: 34.5 g/dL (ref 30.0–36.0)
MCV: 104.2 fl — ABNORMAL HIGH (ref 78.0–100.0)
Monocytes Absolute: 1.2 10*3/uL — ABNORMAL HIGH (ref 0.1–1.0)
Neutro Abs: 7.2 10*3/uL (ref 1.4–7.7)
RDW: 15.3 % — ABNORMAL HIGH (ref 11.5–14.6)

## 2010-06-29 LAB — PROTIME-INR
INR: 2.7 ratio — ABNORMAL HIGH (ref 0.8–1.0)
Prothrombin Time: 27.5 s — ABNORMAL HIGH (ref 10.2–12.4)

## 2010-06-29 LAB — HEPATIC FUNCTION PANEL
AST: 39 U/L — ABNORMAL HIGH (ref 0–37)
Total Bilirubin: 1.1 mg/dL (ref 0.3–1.2)

## 2010-06-29 LAB — CONVERTED CEMR LAB: INR: 2.7

## 2010-06-29 LAB — LIPID PANEL: Total CHOL/HDL Ratio: 4

## 2010-06-29 LAB — TSH: TSH: 2.27 u[IU]/mL (ref 0.35–5.50)

## 2010-06-30 ENCOUNTER — Encounter: Payer: Self-pay | Admitting: Cardiology

## 2010-07-08 NOTE — Medication Information (Signed)
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 no    Recent/future dental: no  Any missed doses?: no       Is patient compliant with meds? yes       Allergies: No Known Drug Allergies  Anticoagulation Management History:      His anticoagulation is being managed by telephone today.  Negative risk factors for bleeding include an age less than 58 years old.  The bleeding index is 'low risk'.  Positive CHADS2 values include History of CHF.  Negative CHADS2 values include Age > 47 years old.  The start date was 01/25/2000.  His last INR was 2.7 ratio and today's INR is 2.7.  Prothrombin time is 27.5.  Anticoagulation responsible provider: [name] MD, Arlys [name].  Exp: 01/2010.    Anticoagulation Management Assessment/Plan:      The patient's current anticoagulation dose is Warfarin sodium 2.5 mg tabs: Use as directed by Anticoagulation Clinic.  The target INR is 2 - 3.  The next INR is due 07/28/2010.  Anticoagulation instructions were given to patient.  Results were reviewed/authorized by Vashti Hey, RN.  He was notified by Vashti Hey RN.         Prior Anticoagulation Instructions: INR 2.9 Continue coumadin 1.25mg  once daily except 2.5mg  on Tuesdays and Thursdays  Current Anticoagulation Instructions: INR 2.7 Continue couamdin 1.25mg  once daily except 2.5mg  on Tuesdays and Thursdays

## 2010-07-08 NOTE — Assessment & Plan Note (Signed)
Summary: ROV PER PT CALL/MB/AC   CC:  check up.  History of Present Illness: [name] is a pleasant gentleman with a history of coronary artery disease, ischemic cardiomyopathy, history of ICD, history of ventricular tachycardia status post ablation, brief history of atrial fibrillation on amiodarone therapy.  His last echocardiogram was performed in July of 2010 and revealed an EF of 20-25 and mild MR. Last cath performed in July of 2010 and revealed an occluded RCA but nonobstructive disease in the Lcx and LAD other than ostial 80 in a small marginal; EF 10-15.  He has had CRT and VT ablations. I last saw him in May of 2011. Since then, the patient denies any dyspnea on exertion, orthopnea, PND, palpitations, syncope or chest pain. Occasional mild pedal edema.   Current Medications (verified): 1)  Atorvastatin Calcium 80 Mg Tabs (Atorvastatin Calcium) .[name].. 1 Tab By Mouth Once Daily 2)  Amiodarone Hcl 200 Mg Tabs (Amiodarone Hcl) .[name].. 1 Tab Morning and 1/2 Tab Afternoon 3)  Carvedilol 12.5 Mg Tabs (Carvedilol) .... Take One Tablet By Mouth Twice A Day 4)  Spironolactone 25 Mg Tabs (Spironolactone) .... Take One Tablet By Mouth Daily 5)  Aspirin 81 Mg Tbec (Aspirin) .... Take One Tablet By Mouth Daily 6)  Lanoxin 0.125 Mg Tabs (Digoxin) .... Once Daily 7)  Niaspan 1000 Mg Cr-Tabs (Niacin (Antihyperlipidemic)) .... Once Daily 8)  Ramipril 2.5 Mg Caps (Ramipril) .... Take One Capsule By Mouth Twice A Day 9)  Warfarin Sodium 2.5 Mg Tabs (Warfarin Sodium) .... Use As Directed By Anticoagulation Clinic 10)  Furosemide 40 Mg Tabs (Furosemide) .... Take One Tablet By Mouth Daily.  Allergies: No Known Drug Allergies  Past History:  Past Medical History: Reviewed history from 09/07/2009 and no changes required. 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 Retired  Disabled  Tobacco Use - Yes. quit Alcohol Use - yes Drug Use - no  Review of Systems       no fevers or chills, productive cough, hemoptysis, dysphasia, odynophagia, melena, hematochezia, dysuria, hematuria, rash, seizure activity, orthopnea, PND, pedal edema, claudication. Remaining systems are negative.   Vital Signs:  Patient profile:   55 year old male Height:      62 inches Weight:      174 pounds BMI:     31.94 Pulse rate:   77 / minute Resp:     16 per minute BP sitting:   118 / 70  (left arm)  Vitals Entered By: Kem Parkinson (June 29, 2010 10:30 AM)  Physical Exam  General:  Well-developed well-nourished in no acute distress.  Skin is warm and dry.  HEENT is normal.  Neck is supple. No thyromegaly.  Chest is clear to auscultation with normal expansion.  Cardiovascular exam is regular rate and rhythm.  Abdominal exam nontender or distended. No masses palpated. Extremities show no edema. neuro grossly intact    EKG  Procedure date:  06/29/2010  Findings:      AV paced.   ICD Specifications Following MD:  Sherryl Manges, MD  ICD Vendor:  Medtronic     ICD Model Number:  [id number]     ICD Serial Number:  [id number] ICD DOI:  12/17/2008     ICD Implanting MD:  Sherryl Manges, MD  Lead 1:    Location: RA     DOI: 10/28/2003     Model #: 6940     Serial #: [id number]     Status: active Lead 2:    Location: RV     DOI: 10/28/2003     Model #: 6945     Serial  #: [id number]     Status: active Lead 3:    Location: LV     DOI: 12/17/2008     Model #: 1258T     Serial #: [id number]     Status: active Lead 4:    Location: RV     DOI: 12/17/2008     Model #: [id number]     Serial #: [id number]     Status: active  Indications::  VT  Explantation Comments: 12/17/2008 Intrinsic 7288/[id number] h explanted  ICD Follow Up ICD Dependent:  Yes       ICD Device Measurements Configuration: LV TIP TO LV RING  Episodes Coumadin:  Yes  Brady Parameters Mode DDDR     Lower Rate Limit:  60     Upper Rate Limit 90 PAV 130     Sensed AV Delay:  100  Tachy Zones VF:  162     VT:  222 FVT VIA VF     VT1:  95     Impression & Recommendations:  Problem # 1:  CAD (ICD-414.00)  Continue aspirin, beta blocker, ACE inhibitor and statin. His updated medication list for this problem includes:    Carvedilol 12.5 Mg Tabs (Carvedilol) .[name]... Take one tablet by mouth twice a day    Aspirin 81 Mg Tbec (Aspirin) .[name]... Take one tablet by mouth daily    Ramipril 2.5 Mg Caps (Ramipril) .[name]... Take one capsule by mouth twice a day    Warfarin Sodium 2.5 Mg Tabs (Warfarin sodium) ..... Use as directed by anticoagulation clinic  His updated medication list for this problem includes:    Carvedilol 12.5 Mg Tabs (Carvedilol) .[name]... Take one tablet by mouth twice a day    Aspirin 81 Mg Tbec (Aspirin) .[name]... Take one tablet by mouth daily    Ramipril 2.5 Mg Caps (Ramipril) .[name]... Take one capsule by mouth twice a day    Warfarin Sodium 2.5 Mg Tabs (Warfarin sodium) ..... Use as directed by anticoagulation clinic  Problem # 2:  COUMADIN THERAPY (ICD-V58.61) Patient to have his INR checked today. I have stressed compliance with followup. Check CBC.  Problem # 3:  CHF (ICD-428.0)  I have instructed the patient to take an additional Lasix daily as needed. His updated medication list for this problem includes:    Amiodarone Hcl 200 Mg Tabs (Amiodarone hcl) .[name]... 1 tab morning and 1/2 tab  afternoon    Carvedilol 12.5 Mg Tabs (Carvedilol) .[name]... Take one tablet by mouth twice a day    Spironolactone 25 Mg Tabs (Spironolactone) .[name]... Take one tablet by mouth daily    Aspirin 81 Mg Tbec (Aspirin) .[name]... Take one tablet by mouth daily    Lanoxin 0.125 Mg Tabs (Digoxin) ..... Once daily    Ramipril 2.5 Mg Caps (Ramipril) .[name]... Take one capsule by mouth twice a day    Warfarin Sodium 2.5 Mg Tabs (Warfarin sodium) ..... Use as directed by anticoagulation  clinic    Furosemide 40 Mg Tabs (Furosemide) .[name]... Take one tablet by mouth daily.  Orders: T-2 View CXR (71020TC)  His updated medication list for this problem includes:    Amiodarone Hcl 200 Mg Tabs (Amiodarone hcl) .[name]... 1 tab morning and 1/2 tab afternoon    Carvedilol 12.5 Mg Tabs (Carvedilol) .[name]... Take one tablet by mouth twice a day    Spironolactone 25 Mg Tabs (Spironolactone) .[name]... Take one tablet by mouth daily    Aspirin 81 Mg Tbec (Aspirin) .[name]... Take one tablet by mouth daily    Lanoxin 0.125 Mg Tabs (Digoxin) ..... Once daily    Ramipril 2.5 Mg Caps (Ramipril) .[name]... Take one capsule by mouth twice a day    Warfarin Sodium 2.5 Mg Tabs (Warfarin sodium) ..... Use as directed by anticoagulation clinic    Furosemide 40 Mg Tabs (Furosemide) .[name]... Take one tablet by mouth daily.  Problem # 4:  ATRIAL FIBRILLATION (ICD-427.31)  Continue amiodarone. Check TSH, liver functions and chest x-ray. Continue Coumadin. His updated medication list for this problem includes:    Amiodarone Hcl 200 Mg Tabs (Amiodarone hcl) .[name]... 1 tab morning and 1/2 tab afternoon    Carvedilol 12.5 Mg Tabs (Carvedilol) .[name]... Take one tablet by mouth twice a day    Aspirin 81 Mg Tbec (Aspirin) .[name]... Take one tablet by mouth daily    Lanoxin 0.125 Mg Tabs (Digoxin) ..... Once daily    Warfarin Sodium 2.5 Mg Tabs (Warfarin sodium) ..... Use as directed by anticoagulation clinic  Orders: TLB-TSH (Thyroid Stimulating Hormone)  (84443-TSH) TLB-Hepatic/Liver Function Pnl (80076-HEPATIC) TLB-BMP (Basic Metabolic Panel-BMET) (80048-METABOL) TLB-CBC Platelet - w/Differential (85025-CBCD) TLB-PT (Protime) (85610-PTP)  His updated medication list for this problem includes:    Amiodarone Hcl 200 Mg Tabs (Amiodarone hcl) .[name]... 1 tab morning and 1/2 tab afternoon    Carvedilol 12.5 Mg Tabs (Carvedilol) .[name]... Take one tablet by mouth twice a day    Aspirin 81 Mg Tbec (Aspirin) .[name]... Take one tablet by mouth daily    Lanoxin 0.125 Mg Tabs (Digoxin) ..... Once daily    Warfarin Sodium 2.5 Mg Tabs (Warfarin sodium) ..... Use as directed by anticoagulation clinic  Problem # 5:  HYPERLIPIDEMIA-MIXED (ICD-272.4)  Continue statin. Check lipids. His updated medication list for this problem includes:    Atorvastatin Calcium 80 Mg Tabs (Atorvastatin calcium) .[name]... 1 tab by mouth once daily    Niaspan 1000 Mg Cr-tabs (Niacin (antihyperlipidemic)) ..... Once daily  Orders: TLB-Lipid Panel (80061-LIPID)  His updated medication list for this problem includes:    Atorvastatin Calcium 80 Mg Tabs (Atorvastatin calcium) .[name]... 1 tab by mouth once daily    Niaspan 1000 Mg Cr-tabs (Niacin (antihyperlipidemic)) ..... Once daily  Problem # 6:  VENTRICULAR TACHYCARDIA (ICD-427.1)  Continue amiodarone. Followed by electrophysiology. His updated medication list for this problem includes:    Amiodarone Hcl 200 Mg Tabs (Amiodarone hcl) .[name]... 1 tab morning and 1/2 tab afternoon    Carvedilol 12.5 Mg Tabs (Carvedilol) .[name]... Take one tablet by mouth twice a day    Aspirin 81 Mg Tbec (Aspirin) .[name]... Take one tablet by mouth daily    Ramipril 2.5 Mg Caps (Ramipril) .[name]... Take one capsule by mouth twice a day    Warfarin Sodium 2.5 Mg Tabs (Warfarin sodium) ..... Use as directed by anticoagulation clinic  His updated medication list for this problem includes:    Amiodarone Hcl 200 Mg Tabs (Amiodarone hcl) .[name]... 1 tab morning and 1/2 tab  afternoon  Carvedilol 12.5 Mg Tabs (Carvedilol) .[name]... Take one tablet by mouth twice a day    Aspirin 81 Mg Tbec (Aspirin) .[name]... Take one tablet by mouth daily    Ramipril 2.5 Mg Caps (Ramipril) .[name]... Take one capsule by mouth twice a day    Warfarin Sodium 2.5 Mg Tabs (Warfarin sodium) ..... Use as directed by anticoagulation clinic  Problem # 7:  ICD -  MEDTRONIC CONCERTO CRT (ICD-V45.02) Management per electrophysiology.  Patient Instructions: 1)  Your physician wants you to follow-up in: 6 MONTHS  You will receive a reminder letter in the mail two months in advance. If you don't receive a letter, please call our office to schedule the follow-up appointment. Prescriptions: WARFARIN SODIUM 2.5 MG TABS (WARFARIN SODIUM) Use as directed by Anticoagulation Clinic  #30 x 3   Entered by:   Kem Parkinson   Authorized by:   Ferman Hamming, MD, [facility]   Signed by:   Kem Parkinson on 06/29/2010   Method used:   Electronically to        [facility] [phone][facility] (retail)       [address]       [place], [place]  [address]       Ph: [phone]       Fax: [phone]   RxID:   [id number] CARVEDILOL 12.5 MG TABS (CARVEDILOL) Take one tablet by mouth twice a day  #60 Tablet x 3   Entered by:   Kem Parkinson   Authorized by:   Ferman Hamming, MD, [facility]   Signed by:   Kem Parkinson on 06/29/2010   Method used:   Electronically to        [facility] [phone]* (retail)       [address]       [place], [place]  [address]       Ph: [phone]       Fax: [phone]   RxID:   [id number] FUROSEMIDE 40 MG TABS (FUROSEMIDE) Take one tablet by mouth daily.  #90 x 3   Entered by:   Kem Parkinson   Authorized by:   Ferman Hamming, MD, [facility]   Signed by:   Kem Parkinson on 06/29/2010   Method used:   [facility]ronically to        [facility] [phone][facility] (retail)       [address]       [place], [place]  [address]       Ph: [phone]       Fax: [phone]   RxID:    [id number] ATORVASTATIN CALCIUM 80 MG TABS (ATORVASTATIN CALCIUM) 1 tab by mouth once daily  #90 x 3   Entered by:   Kem Parkinson   Authorized by:   Ferman Hamming, MD, [facility]   Signed by:   Kem Parkinson on 06/29/2010   Method used:   [facility]ron[facility] to        [facility] [phone] ([facility]ail)       [address]       [place], [place]  [address]       Ph: [phone]       Fax: [phone]   RxID:   [id number] RAMIPRIL 2.5 MG CAPS (RAMIPRIL) Take one capsule by mouth twice a day  #60 x 12   Entered by:   Kem Parkinson   Authorized by:   Ferman Hamming, MD, [facility]   Signed by:   Kem Parkinson on 06/29/2010   Method used:   [facility]ronically to  [facility] [address] (retail)       [address]       [place], [place]  [address]       Ph: [phone]       Fax: [phone]   RxID:   [id number] NIASPAN 1000 MG CR-TABS (NIACIN (ANTIHYPERLIPIDEMIC)) once daily  #30 Tablet x 12   Entered by:   Kem Parkinson   Authorized by:   Ferman Hamming, MD, [facility]   Signed by:   Kem Parkinson on 06/29/2010   Method used:   [facility]        [facility] [phone][facility] ([facility]ail)       [address]       [place], [place]  [address]       Ph: [phone]       Fax: [phone]   RxID:   [id number]

## 2010-07-13 ENCOUNTER — Encounter: Payer: Self-pay | Admitting: Internal Medicine

## 2010-07-13 ENCOUNTER — Ambulatory Visit (INDEPENDENT_AMBULATORY_CARE_PROVIDER_SITE_OTHER): Payer: Medicare Other | Admitting: Internal Medicine

## 2010-07-13 DIAGNOSIS — R7402 Elevation of levels of lactic acid dehydrogenase (LDH): Secondary | ICD-10-CM | POA: Insufficient documentation

## 2010-07-13 DIAGNOSIS — I472 Ventricular tachycardia: Secondary | ICD-10-CM

## 2010-07-13 DIAGNOSIS — I4891 Unspecified atrial fibrillation: Secondary | ICD-10-CM

## 2010-07-13 DIAGNOSIS — Z95810 Presence of automatic (implantable) cardiac defibrillator: Secondary | ICD-10-CM

## 2010-07-13 DIAGNOSIS — R7401 Elevation of levels of liver transaminase levels: Secondary | ICD-10-CM | POA: Insufficient documentation

## 2010-07-13 DIAGNOSIS — I5022 Chronic systolic (congestive) heart failure: Secondary | ICD-10-CM

## 2010-07-13 DIAGNOSIS — R740 Nonspecific elevation of levels of transaminase and lactic acid dehydrogenase [LDH]: Secondary | ICD-10-CM

## 2010-07-20 NOTE — Assessment & Plan Note (Signed)
Summary: ov. per pt call.gd   History of Present Illness: Michael Krueger is seen in followup for ventricular tachycardia status post ablation. He also has a history of atrial fibrillation and he takes amiodarone for the combination of the 2.He is status post a CRT-D implantation for congestive heart failure in the setting of ischemic heart cardiomyopathy   His last echocardiogram was performed in July of 2010 and revealed an EF of 20-25 and mild MR.   Last cath performed in July of 2010 and revealed an occluded RCA but nonobstructive disease in the Lcx and LAD other than ostial 80 in a small marginal; EF 10-15.  He has had CRT and VT ablations  last lipids were February 2012  HDL 33, LDL 85. Amiodarone surveillance laboratories were normal apart from minimally elevated AST at 38;  no digoxin level is available since 2008   Current Medications (verified): 1)  Atorvastatin Calcium 80 Mg Tabs (Atorvastatin Calcium) .[name].. 1 Tab By Mouth Once Daily 2)  Amiodarone Hcl 200 Mg Tabs (Amiodarone Hcl) .[name].. 1 Tab Morning and 1/2 Tab Afternoon 3)  Carvedilol 12.5 Mg Tabs (Carvedilol) .... Take One Tablet By Mouth Twice A Day 4)  Spironolactone 25 Mg Tabs (Spironolactone) .... Take One Tablet By Mouth Daily 5)  Aspirin 81 Mg Tbec (Aspirin) .... Take One Tablet By Mouth Daily 6)  Lanoxin 0.125 Mg Tabs (Digoxin) .... Once Daily 7)  Niaspan 1000 Mg Cr-Tabs (Niacin (Antihyperlipidemic)) .... Once Daily 8)  Ramipril 2.5 Mg Caps (Ramipril) .... Take One Capsule By Mouth Twice A Day 9)  Warfarin Sodium 2.5 Mg Tabs (Warfarin Sodium) .... Use As Directed By Anticoagulation Clinic 10)  Furosemide 40 Mg Tabs (Furosemide) .... Take One Tablet By Mouth Daily.  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  Father alive and well.  He has  one brother with diabetes who is living.  One sister living with history of deep venous thrombosis.  Social History: Last updated: 03/04/2009 Retired  Disabled  Tobacco Use - Yes. quit Alcohol Use - yes Drug Use - no  Vital Signs:  Patient profile:   55 year old male Height:      62 inches Weight:      175.50 pounds BMI:     32.22 Pulse rate:   65 / minute Pulse rhythm:   regular BP sitting:   116 / 80  (right arm) Cuff size:   regular  Vitals Entered By: Judithe Modest CMA (July 13, 2010 9:43 AM)  Physical Exam  General:  The patient was alert and oriented in no acute distress. HEENT Normal.  Neck veins were flat, carotids were brisk.  Lungs were clear.  Heart sounds were regular without murmurs or gallops.  Abdomen was soft with active bowel sounds. There is no clubbing cyanosis or edema. Skin Warm and dry     ICD Specifications Following MD:  Sherryl Manges, MD     ICD Vendor:  Medtronic     ICD Model  Number:  [id number]     ICD Serial Number:  [id number] ICD DOI:  12/17/2008     ICD Implanting MD:  Sherryl Manges, MD  Lead 1:    Location: RA     DOI: 10/28/2003     Model #: 6940     Serial #: [id number]     Status: active Lead 2:    Location: RV     DOI: 10/28/2003     Model #: 6945     Serial #: [id number]     Status: active Lead 3:    Location: LV     DOI: 12/17/2008     Model #: 1258T     Serial #: [id number]     Status: active Lead 4:    Location:  RV     DOI: 12/17/2008     Model #: 3557     Serial #: [id number]     Status: active  Indications::  VT  Explantation Comments: 12/17/2008 Intrinsic 7288/[id number] h explanted  ICD Follow Up ICD Dependent:  Yes       ICD Device Measurements Configuration: LV TIP TO LV RING  Episodes Coumadin:  Yes  Brady Parameters Mode DDDR     Lower Rate Limit:  60     Upper Rate Limit 90 PAV 130     Sensed AV Delay:  100  Tachy Zones VF:  162     VT:  222 FVT VIA VF     VT1:  95     Impression & Recommendations:  Problem # 1:  AV BLOCK, COMPLETE (ICD-426.0) stable on current medications  Problem # 2:  SYSTOLIC HEART FAILURE, CHRONIC (ICD-428.22) stable on current meds  Problem # 3:  ENCOUNTER FOR LONG-TERM USE OF OTHER MEDICATIONS (ICD-V58.69) his amiodarone surveillance laboratories were notable for a mild elevation in his transaminases. We'll need to follow this up.  Problem # 4:  TRANSAMINASES, SERUM, ELEVATED (ICD-790.4) as above; we will recheck in 3 months  Problem # 5:  ATRIAL FIBRILLATION (ICD-427.31) no recurrent atrial fibrillation; we will stop his aspirin. He'll need to have his digoxin check at his next blood draw which will be done in 3 months His updated medication list for this problem includes:    Amiodarone Hcl 200 Mg Tabs (Amiodarone hcl) .[name]... 1 tab morning and 1/2 tab afternoon    Carvedilol 12.5 Mg Tabs (Carvedilol) .[name]... Take one tablet by mouth twice a day    Aspirin 81 Mg Tbec (Aspirin) .[name]... Take one tablet by mouth daily    Lanoxin 0.125 Mg Tabs (Digoxin) ..... Once daily    Warfarin Sodium 2.5 Mg Tabs (Warfarin sodium) ..... Use as directed by anticoagulation clinic  Problem # 6:  VENTRICULAR TACHYCARDIA (ICD-427.1) no recurrent ventricular tachycardia; we'll continue him on his current dose of amiodarone as he had a flurry of VT prompting up titration His updated medication list for this problem includes:    Amiodarone Hcl 200 Mg Tabs (Amiodarone hcl) .[name]...  1 tab morning and 1/2 tab afternoon    Carvedilol 12.5 Mg Tabs (Carvedilol) .[name]... Take one tablet by mouth twice a day    Aspirin 81 Mg Tbec (Aspirin) .[name]... Take one tablet by mouth daily    Ramipril 2.5 Mg Caps (Ramipril) .[name]... Take one capsule by mouth twice a day    Warfarin Sodium 2.5 Mg Tabs (Warfarin sodium) ..... Use as directed by anticoagulation clinic  Problem # 7:  ICD -  MEDTRONIC CONCERTO CRT (ICD-V45.02) Device parameters and  data were reviewed and no changes were made  Patient Instructions: 1)  Your physician recommends that you return for lab work in: 3 MONTHS FASTING LIPID LIVER DIGOXIN V58.69 272.4 2)  Your physician recommends that you continue on your current medications as directed. Please refer to the Current Medication list given to you today. 3)  Your physician wants you to follow-up EA:VWUJ WITH DR Logan Bores will receive a reminder letter in the mail two months in advance. If you don't receive a letter, please call our office to schedule the follow-up appointment.

## 2010-07-20 NOTE — Cardiovascular Report (Signed)
Summary: Office Visit   Office Visit   Imported By: Roderic Ovens 07/16/2010 16:05:27  _____________________________________________________________________  External Attachment:    Type:   Image     Comment:   External Document

## 2010-07-23 ENCOUNTER — Encounter: Payer: Self-pay | Admitting: Internal Medicine

## 2010-07-23 DIAGNOSIS — I4891 Unspecified atrial fibrillation: Secondary | ICD-10-CM

## 2010-07-23 DIAGNOSIS — Z7901 Long term (current) use of anticoagulants: Secondary | ICD-10-CM

## 2010-07-28 ENCOUNTER — Encounter: Payer: Medicare Other | Admitting: *Deleted

## 2010-07-28 ENCOUNTER — Other Ambulatory Visit: Payer: Self-pay | Admitting: Cardiology

## 2010-07-29 NOTE — Telephone Encounter (Signed)
[address] °

## 2010-08-04 ENCOUNTER — Ambulatory Visit (INDEPENDENT_AMBULATORY_CARE_PROVIDER_SITE_OTHER): Payer: Medicare Other | Admitting: *Deleted

## 2010-08-04 DIAGNOSIS — I4891 Unspecified atrial fibrillation: Secondary | ICD-10-CM

## 2010-08-04 DIAGNOSIS — Z7901 Long term (current) use of anticoagulants: Secondary | ICD-10-CM

## 2010-08-05 LAB — CBC
HCT: 44.7 % (ref 39.0–52.0)
Hemoglobin: 15.4 g/dL (ref 13.0–17.0)
MCHC: 34.5 g/dL (ref 30.0–36.0)
MCV: 104.7 fL — ABNORMAL HIGH (ref 78.0–100.0)
RBC: 4.27 MIL/uL (ref 4.22–5.81)
RDW: 15 % (ref 11.5–15.5)

## 2010-08-05 LAB — PROTIME-INR
INR: 1.84 — ABNORMAL HIGH (ref 0.00–1.49)
INR: 2.03 — ABNORMAL HIGH (ref 0.00–1.49)
Prothrombin Time: 21.1 seconds — ABNORMAL HIGH (ref 11.6–15.2)

## 2010-08-05 LAB — BASIC METABOLIC PANEL
CO2: 24 mEq/L (ref 19–32)
Calcium: 8.8 mg/dL (ref 8.4–10.5)
Chloride: 107 mEq/L (ref 96–112)
GFR calc Af Amer: >60 mL/min (ref >60)
Glucose, Bld: 129 mg/dL — ABNORMAL HIGH (ref 70–99)
Sodium: 138 mEq/L (ref 135–145)

## 2010-08-07 LAB — PROTIME-INR
INR: 1.8 — ABNORMAL HIGH (ref 0.00–1.49)
Prothrombin Time: 20.4 seconds — ABNORMAL HIGH (ref 11.6–15.2)

## 2010-08-08 LAB — BASIC METABOLIC PANEL
CO2: 29 mEq/L (ref 19–32)
Calcium: 8.7 mg/dL (ref 8.4–10.5)
Calcium: 8.7 mg/dL (ref 8.4–10.5)
Calcium: 8.8 mg/dL (ref 8.4–10.5)
Calcium: 9.1 mg/dL (ref 8.4–10.5)
Chloride: 100 mEq/L (ref 96–112)
Creatinine, Ser: 1.14 mg/dL (ref 0.4–1.5)
GFR calc Af Amer: >60 mL/min (ref >60)
GFR calc Af Amer: >60 mL/min (ref >60)
GFR calc Af Amer: >60 mL/min (ref >60)
GFR calc non Af Amer: >60 mL/min (ref >60)
GFR calc non Af Amer: >60 mL/min (ref >60)
GFR calc non Af Amer: >60 mL/min (ref >60)
Glucose, Bld: 141 mg/dL — ABNORMAL HIGH (ref 70–99)
Glucose, Bld: 142 mg/dL — ABNORMAL HIGH (ref 70–99)
Glucose, Bld: 144 mg/dL — ABNORMAL HIGH (ref 70–99)
Potassium: 3.9 mEq/L (ref 3.5–5.1)
Potassium: 4.1 mEq/L (ref 3.5–5.1)
Potassium: 4.2 mEq/L (ref 3.5–5.1)
Sodium: 132 mEq/L — ABNORMAL LOW (ref 135–145)
Sodium: 134 mEq/L — ABNORMAL LOW (ref 135–145)
Sodium: 139 mEq/L (ref 135–145)

## 2010-08-08 LAB — URINE DRUGS OF ABUSE SCREEN W ALC, ROUTINE (REF LAB)
Amphetamine Screen, Ur: NEGATIVE
Barbiturate Quant, Ur: NEGATIVE
Cocaine Metabolites: NEGATIVE
Methadone: NEGATIVE
Propoxyphene: NEGATIVE

## 2010-08-08 LAB — TSH: TSH: 1.399 u[IU]/mL (ref 0.350–4.500)

## 2010-08-08 LAB — HEPARIN LEVEL (UNFRACTIONATED)
Heparin Unfractionated: 0.89 IU/mL — ABNORMAL HIGH (ref 0.30–0.70)
Heparin Unfractionated: <0.1 IU/mL — ABNORMAL LOW (ref 0.30–0.70)

## 2010-08-08 LAB — COMPREHENSIVE METABOLIC PANEL
Albumin: 3.3 g/dL — ABNORMAL LOW (ref 3.5–5.2)
Alkaline Phosphatase: 58 U/L (ref 39–117)
BUN: 11 mg/dL (ref 6–23)
BUN: 15 mg/dL (ref 6–23)
CO2: 25 mEq/L (ref 19–32)
Calcium: 8.6 mg/dL (ref 8.4–10.5)
Calcium: 8.9 mg/dL (ref 8.4–10.5)
Creatinine, Ser: 1.03 mg/dL (ref 0.4–1.5)
Creatinine, Ser: 1.05 mg/dL (ref 0.4–1.5)
GFR calc Af Amer: >60 mL/min (ref >60)
GFR calc non Af Amer: >60 mL/min (ref >60)
Glucose, Bld: 139 mg/dL — ABNORMAL HIGH (ref 70–99)
Glucose, Bld: 151 mg/dL — ABNORMAL HIGH (ref 70–99)
Potassium: 3.9 mEq/L (ref 3.5–5.1)
Total Bilirubin: 1.6 mg/dL — ABNORMAL HIGH (ref 0.3–1.2)
Total Protein: 6.6 g/dL (ref 6.0–8.3)

## 2010-08-08 LAB — DIFFERENTIAL
Basophils Relative: 0 % (ref 0–1)
Eosinophils Absolute: 0.1 10*3/uL (ref 0.0–0.7)
Eosinophils Relative: 1 % (ref 0–5)
Lymphocytes Relative: 17 % (ref 12–46)
Lymphocytes Relative: 24 % (ref 12–46)
Lymphs Abs: 2.1 10*3/uL (ref 0.7–4.0)
Monocytes Absolute: 1 10*3/uL (ref 0.1–1.0)
Monocytes Relative: 12 % (ref 3–12)
Neutro Abs: 10.4 10*3/uL — ABNORMAL HIGH (ref 1.7–7.7)
Neutro Abs: 5.7 10*3/uL (ref 1.7–7.7)
Neutrophils Relative %: 70 % (ref 43–77)

## 2010-08-08 LAB — APTT
aPTT: 46 seconds — ABNORMAL HIGH (ref 24–37)
aPTT: 49 seconds — ABNORMAL HIGH (ref 24–37)

## 2010-08-08 LAB — PROTIME-INR
INR: 1.2 (ref 0.00–1.49)
INR: 1.2 (ref 0.00–1.49)
INR: 1.2 (ref 0.00–1.49)
INR: 1.8 — ABNORMAL HIGH (ref 0.00–1.49)
INR: 2.3 — ABNORMAL HIGH (ref 0.00–1.49)
Prothrombin Time: 15.1 seconds (ref 11.6–15.2)
Prothrombin Time: 15.3 seconds — ABNORMAL HIGH (ref 11.6–15.2)
Prothrombin Time: 15.7 seconds — ABNORMAL HIGH (ref 11.6–15.2)
Prothrombin Time: 17.6 seconds — ABNORMAL HIGH (ref 11.6–15.2)
Prothrombin Time: 19.8 seconds — ABNORMAL HIGH (ref 11.6–15.2)
Prothrombin Time: 21.7 seconds — ABNORMAL HIGH (ref 11.6–15.2)
Prothrombin Time: 26.7 seconds — ABNORMAL HIGH (ref 11.6–15.2)
Prothrombin Time: 37.3 seconds — ABNORMAL HIGH (ref 11.6–15.2)
Prothrombin Time: 40.4 seconds — ABNORMAL HIGH (ref 11.6–15.2)
Prothrombin Time: 40.4 seconds — ABNORMAL HIGH (ref 11.6–15.2)

## 2010-08-08 LAB — CARDIAC PANEL(CRET KIN+CKTOT+MB+TROPI)
CK, MB: 2.1 ng/mL (ref 0.3–4.0)
Relative Index: 1.6 (ref 0.0–2.5)
Total CK: 131 U/L (ref 7–232)
Total CK: 135 U/L (ref 7–232)
Troponin I: 0.03 ng/mL (ref 0.00–0.06)

## 2010-08-08 LAB — CBC
HCT: 41 % (ref 39.0–52.0)
HCT: 44.3 % (ref 39.0–52.0)
HCT: 44.7 % (ref 39.0–52.0)
HCT: 46.6 % (ref 39.0–52.0)
HCT: 46.7 % (ref 39.0–52.0)
Hemoglobin: 14.2 g/dL (ref 13.0–17.0)
Hemoglobin: 15.1 g/dL (ref 13.0–17.0)
Hemoglobin: 15.4 g/dL (ref 13.0–17.0)
Hemoglobin: 15.9 g/dL (ref 13.0–17.0)
Hemoglobin: 16 g/dL (ref 13.0–17.0)
MCHC: 34.1 g/dL (ref 30.0–36.0)
MCHC: 34.4 g/dL (ref 30.0–36.0)
MCHC: 34.7 g/dL (ref 30.0–36.0)
MCV: 102.6 fL — ABNORMAL HIGH (ref 78.0–100.0)
MCV: 103.5 fL — ABNORMAL HIGH (ref 78.0–100.0)
MCV: 104.3 fL — ABNORMAL HIGH (ref 78.0–100.0)
Platelets: 178 10*3/uL (ref 150–400)
RBC: 4.26 MIL/uL (ref 4.22–5.81)
RBC: 4.32 MIL/uL (ref 4.22–5.81)
RBC: 4.5 MIL/uL (ref 4.22–5.81)
RBC: 4.52 MIL/uL (ref 4.22–5.81)
RDW: 14.6 % (ref 11.5–15.5)
RDW: 14.6 % (ref 11.5–15.5)
RDW: 15 % (ref 11.5–15.5)
RDW: 15.1 % (ref 11.5–15.5)
WBC: 13.6 10*3/uL — ABNORMAL HIGH (ref 4.0–10.5)
WBC: 8.9 10*3/uL (ref 4.0–10.5)

## 2010-08-08 LAB — POCT I-STAT, CHEM 8
Calcium, Ion: 1.05 mmol/L — ABNORMAL LOW (ref 1.12–1.32)
Chloride: 108 mEq/L (ref 96–112)
Creatinine, Ser: 0.9 mg/dL (ref 0.4–1.5)
Glucose, Bld: 120 mg/dL — ABNORMAL HIGH (ref 70–99)
HCT: 46 % (ref 39.0–52.0)
Hemoglobin: 15.6 g/dL (ref 13.0–17.0)

## 2010-08-08 LAB — LIDOCAINE LEVEL: Lidocaine Lvl: 5.6 ug/mL — ABNORMAL HIGH (ref 1.5–5.0)

## 2010-08-08 LAB — GLUCOSE, CAPILLARY: Glucose-Capillary: 126 mg/dL — ABNORMAL HIGH (ref 70–99)

## 2010-08-08 LAB — CK TOTAL AND CKMB (NOT AT ARMC)
CK, MB: 2.3 ng/mL (ref 0.3–4.0)
Relative Index: 1.3 (ref 0.0–2.5)

## 2010-08-08 LAB — POCT CARDIAC MARKERS: CKMB, poc: 1.5 ng/mL (ref 1.0–8.0)

## 2010-08-08 LAB — MAGNESIUM: Magnesium: 2.1 mg/dL (ref 1.5–2.5)

## 2010-08-09 LAB — DIFFERENTIAL
Basophils Absolute: 0.1 10*3/uL (ref 0.0–0.1)
Basophils Relative: 1 % (ref 0–1)
Eosinophils Absolute: 0 10*3/uL (ref 0.0–0.7)
Eosinophils Relative: 1 % (ref 0–5)
Lymphocytes Relative: 28 % (ref 12–46)
Lymphs Abs: 2.3 10*3/uL (ref 0.7–4.0)
Monocytes Absolute: 0.8 10*3/uL (ref 0.1–1.0)
Monocytes Relative: 10 % (ref 3–12)
Neutro Abs: 4.9 10*3/uL (ref 1.7–7.7)
Neutrophils Relative %: 60 % (ref 43–77)

## 2010-08-09 LAB — COMPREHENSIVE METABOLIC PANEL
ALT: 27 U/L (ref 0–53)
AST: 33 U/L (ref 0–37)
Albumin: 3.7 g/dL (ref 3.5–5.2)
Alkaline Phosphatase: 64 U/L (ref 39–117)
BUN: 13 mg/dL (ref 6–23)
CO2: 27 mEq/L (ref 19–32)
Calcium: 9.2 mg/dL (ref 8.4–10.5)
Chloride: 106 mEq/L (ref 96–112)
Creatinine, Ser: 1.17 mg/dL (ref 0.4–1.5)
GFR calc Af Amer: >60 mL/min (ref >60)
GFR calc non Af Amer: >60 mL/min (ref >60)
Glucose, Bld: 152 mg/dL — ABNORMAL HIGH (ref 70–99)
Potassium: 4.3 mEq/L (ref 3.5–5.1)
Sodium: 141 mEq/L (ref 135–145)
Total Bilirubin: 1 mg/dL (ref 0.3–1.2)
Total Protein: 6.9 g/dL (ref 6.0–8.3)

## 2010-08-09 LAB — POCT CARDIAC MARKERS: Myoglobin, poc: 117 ng/mL (ref 12–200)

## 2010-08-09 LAB — CBC
HCT: 47 % (ref 39.0–52.0)
Hemoglobin: 15.9 g/dL (ref 13.0–17.0)
MCHC: 33.8 g/dL (ref 30.0–36.0)
MCV: 103.8 fL — ABNORMAL HIGH (ref 78.0–100.0)
Platelets: 194 10*3/uL (ref 150–400)
RBC: 4.53 MIL/uL (ref 4.22–5.81)
RDW: 15.2 % (ref 11.5–15.5)
WBC: 8.2 10*3/uL (ref 4.0–10.5)

## 2010-08-17 ENCOUNTER — Other Ambulatory Visit: Payer: Self-pay | Admitting: Cardiology

## 2010-09-01 ENCOUNTER — Other Ambulatory Visit: Payer: Self-pay | Admitting: Cardiology

## 2010-09-02 ENCOUNTER — Ambulatory Visit (INDEPENDENT_AMBULATORY_CARE_PROVIDER_SITE_OTHER): Payer: Medicare Other | Admitting: *Deleted

## 2010-09-02 DIAGNOSIS — Z7901 Long term (current) use of anticoagulants: Secondary | ICD-10-CM

## 2010-09-02 DIAGNOSIS — I4891 Unspecified atrial fibrillation: Secondary | ICD-10-CM

## 2010-09-14 NOTE — Discharge Summary (Signed)
NAMEZAELYN, BARBARY           ACCOUNT NO.:  [id number]   MEDICAL RECORD NO.:  [id number]          PATIENT TYPE:  INP   LOCATION:  2922                         FACILITY:  [facility]   PHYSICIAN:  Maple Mirza, PA   DATE OF BIRTH:  1955-09-29   DATE OF ADMISSION:  12/17/2008  DATE OF DISCHARGE:  12/18/2008                               DISCHARGE SUMMARY   This patient has no known drug allergies and time for this dictation,  examination, and explanation to the patient is greater than 40 minutes.   FINAL DIAGNOSES:  1. Fatigue and decreased exercise tolerance with increase in his [place]      [place] Heart [employer] Class of congestive heart failure after      ventricular tachycardia ablation, November 06, 2008 (the patient is in      complete heart block with V-pacing).  2. Left venography at electrophysiology procedure, December 17, 2008.  3. The patient discharging day one, status post explant of existing      cardioverter-defibrillator.  3A.  Implant of the Medtronic CONCERTO CRT-D.  3B.  New left ventricular lead.  3C.  New right ventricular pace sense lead.  3D.  Repair to the atrial lead.  This is an implantable cardioverter-  defibrillator upgrade.   SECONDARY DIAGNOSES:  1. Fatigue and decreased exercise tolerance after ventricular      tachycardia ablation procedure, November 06, 2008.  2. History of incessant bundle-branch reentrant ventricular      tachycardia (slow at 100-110 beats per minute).  3. Electrophysiology study, November 06, 2008.  3A.  Left anterior fascicular ventricular tachycardia.  3B.  Radiofrequency catheter ablation of the anterior septum of the left  ventricle at the base resulting in complete heart block.  3C.  Prior ventricular tachycardia ablation, February 2006.  1. Left heart catheterization, November 04, 2008.  Ejection fraction 10-      15%.  The left main is free of disease.  The left anterior      descending had a 30% in-stent restenosis of the stent at the  first      diagonal, also multiple 30% mid and distal stenosis.  The left      circumflex had a less than 70% in-stent restenosis of the stent.      In the proximal first obtuse marginal, the left circumflex had a      40% midpoint stenosis.  The right coronary artery had a 70%      stenosis after a right ventricular branch then totally occluded.      There were bridging collaterals to the posterior descending artery      and to the right coronary artery up to the occlusion.  2. Right bundle-branch block.  3. Ischemic cardiomyopathy.  6A.  History of acute anterolateral myocardial infarction, September  2001, with cardiogenic shock and intra-aortic balloon pump.  1. Paroxysmal atrial fibrillation, on chronic Coumadin.  Effective      suppression of this dysrhythmia, on amiodarone.  2. Sick sinus syndrome/chronotropic incompetence/ventricular      tachycardia with implantation of an implantable cardioverter-      defibrillator generator  change, October 28, 2003.   PROCEDURE:  On December 17, 2008.  1. Left ventriculogram.  2. Explant of existing implantable cardioverter-defibrillator.  3. Implant of Medtronic CONCERTO.  Cardioverter-defibrillator with      cardiac resynchronization, left ventricular lead, Dr. Sherryl Manges.   The patient had no post postprocedural complications.  He is in sinus  rhythm.  His incision has no hematoma.  The device interrogates well.  Chest x-ray shows the leads are in appropriate position.  The patient  for discharge postprocedure day #1.   BRIEF HISTORY:  Mr. Renteria is a 55 year old male.  He had a catheter  ablation of a bundle-branch reentry ventricular tachycardia.  This has  left him device dependent with complete heart block.  He feels worse  since the procedure.  He has had no ventricular tachycardia, but he has  generalized fatigue, episodic shortness of breath, and malaise.  He also  complains of nervousness and tremulousness.  He has been on  amiodarone  for many years.  Dose had been recently increased, but then down  titrated once again.  At the office visit on December 05, 2008, it was  discussed that the patient would possibly benefit from an upgrade of his  device to a Bi-V ICD.  Potential benefits as well as risks have been  discussed with the patient, but he wishes to proceed.  The patient will  have a left venogram prior to the procedure.  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  It looks to me as if from rating the note that the amiodarone was down  titrated from 200 mg daily to 100 mg daily, I have to check with the  patient on that and ask that he should do that if he has not already  done so.   LABORATORY STUDIES:  Pertinent to this admission were drawn on December 12, 2008.  Sodium 140, potassium 4.2, chloride 106, carbonate 28,  glucose 125, BUN is 14, and creatinine is 1.  White cells of 10.1,  hematocrit 14.5, hemoglobin 42.3, and platelets are 178.  Protime at  this testing was 21.3 and INR is 2.1.  The patient is discharging today.  He will restart his Coumadin course at one-half of a 2.5 mg tab daily.   FOLLOWUP:  He has follow up appointments at [facility].  1. [place] office, Monday, December 22, 2008, at 9 o'clock with the      Coumadin Clinic.  2. [facility] office ICD Clinic, Thursday, January 01, 2009, at 3      o'clock.  3. [place] office to see Dr. Graciela Husbands, Tuesday, March 24, 2009, at      9:10 a.m.      Maple Mirza, [place]     GM/MEDQ  D:  12/18/2008  T:  12/18/2008  Job:  [id number]   cc:   [name]. [name], MD, [facility]  Duke Salvia, MD, [facility]

## 2010-09-14 NOTE — Assessment & Plan Note (Signed)
[facility]                            CARDIOLOGY OFFICE NOTE   [name], [name]                  MRN:          [id number]  DATE:07/21/2008                            DOB:          [birth year]    Michael Krueger is a pleasant gentleman with a history of coronary artery  disease, ischemic cardiomyopathy, history of ICD, history of ventricular  tachycardia status post ablation, brief history of atrial fibrillation  on amiodarone therapy.  His last Myoview was performed on July 23, 2007.  Ejection fraction was 19%.  There was an extensive prior  anterior, septal, inferior, and apical infarct, but there was no  ischemia.  Since I last saw him, he is doing well symptomatically.  There is no dyspnea, chest pain, palpitations, or syncope, and there is  no pedal edema.   MEDICATIONS:  1. Lipitor 80 mg p.o. daily.  2. Amiodarone 200 mg p.o. daily.  3. Carvedilol 12.5 mg p.o. b.i.d.  4. Coumadin as directed.  5. Spironolactone 25 mg p.o. daily.  6. Aspirin 81 mg p.o. daily.  7. Lasix 20 mg p.o. daily.  8. Altace 2.5 mg p.o. daily.  9. Lanoxin 0.125 mg p.o. daily.  10.Niaspan 1 g p.o. daily.   PHYSICAL EXAMINATION:  VITAL SIGNS:  Blood pressure 112/71 and his pulse  is 63.  HEENT:  Normal.  NECK:  Supple.  CHEST:  Clear.  CARDIOVASCULAR:  Regular rate.  ABDOMEN:  No tenderness.  EXTREMITIES:  No edema.   Electrocardiogram shows an atrial-paced rhythm with a first-degree AV  block.  There is a right bundle-branch block.  There is a prior anterior  septal infarct.  There is diffuse T-wave inversion.   DIAGNOSES:  1. Coronary artery disease - Michael Krueger is doing well with no chest      pain and his last Myoview showed no ischemia.  We will continue      with medical therapy including his aspirin, beta-blocker,      angiotensin-converting enzyme inhibitor, and statin.  2. Ischemic cardiomyopathy - as per #1.  3. Status post implantable  cardioverter-defibrillator - he is being      followed by Dr. Graciela Husbands.  4. History of ventricular tachycardia ablation.  5. Remote history of atrial fibrillation - he will continue on his      Coumadin as well as his amiodarone.  6. History of amiodarone-induced hyperthyroidism - we will plan to      repeat his TSH.  7. Amiodarone therapy - he also will need a chest x-ray as well as      liver functions.  8. Coumadin therapy - we will check a CBC and this will be monitored      at Coumadin Clinic.  9. Hyperlipidemia - he will continue on his Lipitor and Niaspan.  We      will check lipids and liver.  10.History of Hodgkin disease.   He will see me back in 9 months.     [name] [name], MD, [facility]  Electronically Signed    BSC/MedQ  DD: 07/21/2008  DT: 07/22/2008  Job #: [id number]

## 2010-09-14 NOTE — Assessment & Plan Note (Signed)
[facility]                            CARDIOLOGY OFFICE NOTE   WISTER, HOEFLE                  MRN:          [id number]  DATE:07/16/2007                            DOB:          [birth year]    Mr. Slinker is a pleasant gentleman who has a history of coronary  disease, ischemic cardiomyopathy, status post ICD, history ventricular  tachycardia status post ablation as well as amiodarone therapy.  His  most recent catheterization was performed on Sep 05, 2005.  At that time  he had an ejection fraction of 10-15%.  There was 70% very small obtuse  marginal and an occluded right coronary artery with right-to-right  collaterals.  It was felt that he was adequately revascularized.  Since  I last saw him, he does have some dyspnea on exertion at times, but  there is no orthopnea, PND or pedal edema.  Has not had palpitations or  syncope.  He does have for the past 3 weeks an occasional burning pain  in his chest.  Note, the pain is unlike his previous myocardial  infarction pain.  It is not pleuritic or positional, nor is it related  to food.  Is not exertional.  It can last anywhere from 1/2 a day to 2  days continuously without ever completely resolving.  There is no  associated nausea, vomiting, shortness of breath or diaphoresis.   MEDICATIONS:  1. Amiodarone 200 mg p.o. daily.  2. Spironolactone 25 mg daily.  3. Carvedilol 12.5 mg p.o. b.i.d.  4. Lasix 20 mg daily.  5. Niaspan 1 gram p.o. daily.  6. Lanoxin 125 mcg daily.  7. Lipitor 40 mg daily.  8. Coumadin as directed.  9. Ramipril 5 mg p.o. b.i.d.   PHYSICAL EXAMINATION:  VITAL SIGNS:  Blood pressure of 110/70.  His  pulse is 62.  He weighs 69 pounds.  HEENT:  Normal.  NECK:  Supple.  CHEST:  Clear.  CARDIOVASCULAR:  Regular rhythm.  ABDOMEN:  Exam shows no tenderness.  EXTREMITIES:  Show no edema.   His electrocardiogram shows an atrial paced rhythm.  There is a first-  degree  AV block.  There is a right bundle branch block.  There is a  prior anterior infarct as well as inferior infarct.  It is unchanged  from previous.   DIAGNOSES:  1. Chest pain:  His symptoms are atypical.  They are unlike his      previous infarct pain, and they last up to 2 days at a time.      However, given his extensive cardiac history, we will plan to      repeat his Myoview.  If it shows no ischemia, then we will continue      medical therapy.  2. Coronary artery disease:  We will continue with his present      medications including his beta blocker, statin,  Niaspan and ACE      inhibitor.  Note, he had been on Coumadin in the past secondary to      a large myocardial infarction.  However, he has been on this for  some time, and there is no documentation of any history of atrial      arrhythmias.  I will discontinue this, and we will instead treat      with aspirin 325 mg p.o. daily.  3. Ischemic cardiomyopathy:  He will continue on his beta blockers as      well as his ACE inhibitor.  4. Status post implantable cardioverter-defibrillator:  He will be      followed by Dr. Graciela Husbands for this.  5. History of ventricular tachycardia ablation.  6. History of amiodarone-induced hyperthyroidism:  This is being      managed by Dr. Talmage Nap.  Given his amiodarone use, we will check a      TSH, liver functions and chest x-ray.  7. Coumadin therapy:  We will discontinue this, and we will notify      [place].  8. Hyperlipidemia:  He will continue on a statin.  We will check      lipids and liver and adjust as indicated.  9. History of Hodgkin's disease.   We will see him back in 6 months.     [name] [name], MD, [facility]  Electronically Signed    BSC/MedQ  DD: 07/16/2007  DT: 07/16/2007  Job #: [id number]

## 2010-09-14 NOTE — Discharge Summary (Signed)
NAMEWALLIS, VANCOTT           ACCOUNT NO.:  [id number]   MEDICAL RECORD NO.:  [id number]          PATIENT TYPE:  INP   LOCATION:  2012                         FACILITY:  [facility]   PHYSICIAN:  Marca Ancona, MD      DATE OF BIRTH:  [birth year]   DATE OF ADMISSION:  10/31/2008  DATE OF DISCHARGE:  11/08/2008                               DISCHARGE SUMMARY   PRIMARY CARDIOLOGIST:  [name]. [name], MD, [facility]   PRIMARY ELECTROPHYSIOLOGIST:  Duke Salvia, MD, [facility]   DISCHARGE DIAGNOSES:  1. Ventricular tachycardia, sustained.      a.     Status post successful radiofrequency catheter ablation,       complicated by development of 30-degree atrioventricular block.  2. Severe ischemic cardiomyopathy.      a.     Status post cardiac catheterization indicating continued       patency of previously placed stents in left anterior descending       and circumflex arteries; 100% occluded right coronary artery with       bridging collaterals; ejection fraction 10-15%, medical therapy       recommended.   SECONDARY DIAGNOSES:  1. Paroxysmal atrial fibrillation.      a.     On chronic Coumadin, resume at discharge.  2. Dyslipidemia.  3. Status post implantable cardioverter-defibrillator implantation.      a.     Status post generator change out in June 2005.  4. Non-Hodgkin lymphoma.   HOSPITAL COURSE:  Mr. Borgmeyer is a 55 year old male with known severe  ischemic cardiomyopathy and longstanding history of ventricular  tachycardia, status post prior ICD implantation.  He presented with  recurrent tachy palpitations and presented with documented evidence of  accelerated idioventricular rhythm, treated with antitachycardia pacing.  He was admitted for further evaluation and recommendations.   HOSPITAL COURSE:  The patient ruled out for myocardial infarction with  normal serial cardiac markers.  Coumadin was placed on hold and he was  placed on intravenous heparin with plans to proceed  with cardiac  catheterization.  He was also started on intravenous amiodarone.   Coronary angiography performed by Dr. Riley Kill indicated severe ischemic  cardiomyopathy with an EF of 10-15%.  However, there was continued  patency of the previously placed stents in both the LAD and circumflex  arteries.  There was 100% occlusion of the RCA, with bridging  collaterals to the distal vessel.  Dr. Riley Kill recommended medical  management and referred him for further evaluation to the  electrophysiology team.   The patient was initially seen by Dr. Graciela Husbands who following the  interrogation of the device, noted that this did, in fact, indicate  ventricular tachycardia and not supraventricular tachycardia.  Dr. Graciela Husbands  also consulted with Dr. Ladona Ridgel who reviewed the EKGs, noting the patient  had more of ventricular tachycardia the previous evening.  Dr. Ladona Ridgel  indicated that the rhythm suggested identical QRS and VT as in an SR  (atrial pacing).  Dr. Ladona Ridgel felt that this was either bundle branch  reentry or VT originating high in the LV septum.  Dr. Ladona Ridgel also  noted  that with the right bundle-branch block, that ablation on the LV septum  may very well result in development of complete heart block.    The patient was subsequently cleared to proceed with radiofrequency  ablation, which was performed by Dr. Ladona Ridgel on November 06, 2008.  The  ablation of the incessant VT was successful; however, the procedure was  complicated by development of 30-degree AV block.   The patient was kept in hospital for continued close monitoring, and  subsequently cleared for discharge after 48 hours.  Of note, Dr. Johney Frame  noted that the patient demonstrated evidence of a second ventricular  tachycardia, although actually more of an accelerated ventricular rhythm  the night following his ablation.  Dr. Johney Frame felt that this was not the  same VT as the ablated VT.  Given this, Dr. Johney Frame resume the amiodarone  with  recommendations to continue at the increased dose of 200 b.i.d. for  1 week and then back down to 200 mg daily.  Dr. Johney Frame also indicated  that Dr. Graciela Husbands is to reevaluate the option of an upgrade to a  biventricular pacer at the time of post hospital followup.   At the time of discharge, the patient was instructed to resume Coumadin  with arrangements of this checked early next week in our Coumadin  Clinic.  Of note, he apparently has been followed for several years at  our [facility].  Also, although Lanoxin was listed as one of his  home medications, this was not a part of his regimen during this stay.  Therefore, the patient is to continue off this medication pending  followup and further recommendations.   DISCHARGE LABORATORIES:  INR 1.2.   OUTSTANDING LABORATORIES:  Normal CBC on admission.  INR 3.8 on  admission.  Normal electrolytes and renal function throughout.  Normal  cardiac markers.  TSH 1.4.   DISPOSITION:  Stable.   DISCHARGE MEDICATIONS:  1. Amiodarone 200 mg b.i.d. (x1 week), then 200 mg daily.  2. Coated aspirin 81 mg daily.  3. Coumadin 2.5 mg daily.  4. Spironolactone 25 mg daily.  5. Lasix 20 mg daily.  6. Coreg 6.25 mg b.i.d.  7. Ramipril 2.5 mg daily.  8. Lipitor 80 mg daily.  9. Niaspan 1 g nightly.  10.Prevacid 15 mg daily.   INSTRUCTIONS:  1. The patient is to follow up with Dr. Olga Millers in 10 days and      with Dr. Sherryl Manges in 2-3 weeks.  Arrangements to be made      through our office.  2. The patient is to have an early followup protime/INR in our      [facility].  Arrangements to be made      through our office.  3. The patient is to resume Coumadin at the previous dose of 2.5 mg      daily on the day of discharge.   Discharge encounter including physician time approximately 45 minutes.      Gene Serpe, PA-C      Marca Ancona, MD  Electronically Signed    GS/MEDQ  D:  11/08/2008  T:  11/08/2008  Job:   [id number]

## 2010-09-14 NOTE — Cardiovascular Report (Signed)
NAMEPHOENIX, DRESSER           ACCOUNT NO.:  [id number]   MEDICAL RECORD NO.:  [id number]           PATIENT TYPE:   LOCATION:                                 FACILITY:   PHYSICIAN:  Arturo Morton. Riley Kill, MD, FACCDATE OF BIRTH:  Oct 22, 1955   DATE OF PROCEDURE:  11/04/2008  DATE OF DISCHARGE:                            CARDIAC CATHETERIZATION   INDICATIONS:  The patient is a 55 year old with ventricular tachycardia.  Cardiac catheterization has been recommended prior to electrophysiologic  evaluation.   PROCEDURES:  1. Left heart catheterization.  2. Selective coronary arteriography.  3. Selective left ventriculography.   DESCRIPTION OF THE PROCEDURE:  The patient's INR had fallen to 1.6  earlier in the day.  We used 5-French catheters.  Using an anterior  puncture, the femoral artery was entered and a 5-French sheath was  placed.  Views of the left and right coronary arteries were obtained.  The RCA was used with a Williams catheter.  Central aortic and left  ventricular pressures were measured with pigtail, and ventriculography  was done in the RAO projection.  There were no complications.  The  patient tolerated the procedure well and then taken to the holding area  in satisfactory clinical condition for sheath removal.   HEMODYNAMIC DATA:  1. Central aortic pressure 103/63, mean 80.  2. Left ventricular pressure 102/16 to 18.  3. There was not a gradient on pullback across the aortic valve.   ANGIOGRAPHIC DATA:  1. The left main is free of critical disease.  2. The LAD is fairly calcified.  There was a previously placed stent      just after the diagonal.  There is less than 30% narrowing      throughout the stent.  There are multiple areas of 30% narrowing      throughout the mid and distal vessel with calcified vessel but no      critical stenoses noted.  3. The circumflex is a fairly large-caliber vessel.  There is a tiny      marginal branch with about 80% ostial  proximal narrowing, but it is      about 1.5-mm artery not covering a large territory.  Proximal to      this location is a previously placed stent with less than 30%      narrowing.  There is diffuse 40% narrowing throughout the mid      circumflex, which supplies a large circumflex territory.  There is      no critical narrowing in this area.  4. The right coronary artery is irregular with 70% narrowing after an      RV branch, then totally occluded and then there are bridging      collaterals that provide blood to the posterior descending,      posterolateral system.   Ventriculography in the RAO projection reveals a markedly enlarged  ventricle with diffuse hypokinesis.  Segmentality of this is difficult  to discern, but ejection fraction would be estimated in the 10-15%  range.  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   CONCLUSIONS:  1. Severe reduction in  left ventricular function with ejection      fraction estimated at10-15% range.  2. Continued patency of the stents previously placed in the left      anterior descending and circumflex.  3. Small circumflex marginal branch with ostial proximal stenosis but      small vessel.  4. Total occlusion of the right coronary artery with bridging      collateral to the distal vessel.   DISPOSITION:  The patient will remain on medical therapy.  Electrophysiologic evaluation will be recommended.      Arturo Morton. Riley Kill, MD, [facility]  Electronically Signed     TDS/MEDQ  D:  11/04/2008  T:  11/05/2008  Job:  [id number]

## 2010-09-14 NOTE — Assessment & Plan Note (Signed)
 HEALTHCARE                            CARDIOLOGY OFFICE NOTE   [name], [name]                  MRN:          [id number]  DATE:07/16/2007                            DOB:          11/29/1955    ADDENDUM:  This is an addendum to a note I just dictated on Michael Krueger.  I did review the patient's old records in the computer and  apparently he did have a history of atrial fibrillation.  I will,  therefore, continue with his Coumadin with a goal INR of 2-3 and this is  being followed in the [place] [facility].     [name] [name], MD, [facility]  Electronically Signed    BSC/MedQ  DD: 07/16/2007  DT: 07/16/2007  Job #: [id number]

## 2010-09-14 NOTE — Op Note (Signed)
Michael Krueger, SCHERTZER           ACCOUNT NO.:  [id number]   MEDICAL RECORD NO.:  [id number]          PATIENT TYPE:  INP   LOCATION:  2012                         FACILITY:  [facility]   PHYSICIAN:  Doylene Canning. Ladona Ridgel, MD    DATE OF BIRTH:  16-Mar-1956   DATE OF PROCEDURE:  11/06/2008  DATE OF DISCHARGE:                               OPERATIVE REPORT   PROCEDURE PERFORMED:  Physiologic study and RF catheter ablation of  sustained ventricular tachycardia.   INTRODUCTION:  The patient is a 55 year old male with a longstanding  ischemic cardiomyopathy and congestive heart failure.  He has had a  history of VT in the past and underwent VT ablation in 2006.  He  subsequently developed recurrent VT 4 years later which has been  incessant at rates between 100 and 110 beats per minute.  Interestingly  enough the VT morphology is exactly identical to the patient's  underlying QRS morphology in sinus rhythm suggesting a bundle-branch  reentrant mechanism.  He is now referred for a catheter ablation.   PROCEDURE:  After informed consent was obtained, the patient was taken  to the Diagnostic EP Lab in a fasting state.  After usual preparation  and draping, intravenous fentanyl and midazolam was given for sedation.  A 6-French hexapolar catheter was inserted percutaneously in the right  jugular vein and advanced to coronary sinus.  A 5-French quadripolar  catheter was inserted percutaneously in the right femoral vein and  advanced to the right ventricle.  A 5-French quadripolar catheter was  inserted percutaneously in the right femoral vein and advanced to the  His bundle region.  With manipulation of the catheter, the patient was  in a sustained VT and had persistence of sustained VT basically  throughout the case.  A 7-French quadripolar ablation catheter was then  inserted percutaneously into the  right femoral artery and advanced  retrograde across the aortic valve into the left ventricle.  An  extensive activation map was made during VT.  This actually demonstrated  the earliest activation of the left ventricle to occur at a location  near the anterior superior base of the left ventricle.  This was the  earliest breakout site.  Pacing from this location demonstrated a pace  map which was very close but not identical to the patient's intrinsic  VT.  During ventricular pacing, the post pacing interval was again  closed but not exactly the same as the patient's tachycardia cycle  length.  At this point, it took about an hour and half mapping time to  accomplish.  The ablation catheter was maneuvered into the area more  along the anterior septum of the left ventricle near the base.  Pace  mapping in this  location also demonstrated a very very nice pace map  and there is also in this location a very significant amount of  fractionated ventricular electrogram.  There were mid-diastolic  potentials present.  RF energy application was delivered with a total of  5 RF energy applications delivered to the successful site.  During RF  energy application ventricular tachycardia was terminated and remained  terminated  throughout the rest of the procedure.  Additional pacing was  then carried out.  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  The patient's defibrillator was reprogrammed.  Ventricular pacing was then carried out demonstrated of course VA  dissociation at 600 milliseconds.  Programmed ventricular stimulation  was carried out with S1-S2 and S1, S2, S3 stimuli delivered.  With the  S1, S2, S3 stimuli stepwise decreased down to an S1, S2, S3 coupling  interval of 600/260/310.  There was induction of a very rapid VT which  was  hemodynamically unstable.  This was in no way related to the  patient's clinical VT.  Blood pressure was about 50 during this VT.  This VT appeared to be a right bundloid pattern VT with complex  activation.  The axis was actually superior.  The patient was  subsequently defibrillatored after attempts to pace terminate the VT  were unsuccessful.  At this point it was deemed most appropriate not to  attempt any additional ablation and the catheter was removed.  Hemostasis was assured and the patient was returned to his room in  satisfactory condition.   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  A.  Baseline ECG.  Baseline ECG demonstrates ventricular tachycardia at  110 beats per minute.  B.  Baseline intervals.  The HV interval was 70 milliseconds in VT,  approximately 100 milliseconds in sinus rhythm.  C.  Rapid ventricular pacing.  Following ablation rapid ventricular  pacing did not induce VT.  There was VA dissociation.  D.  Programmed ventricular stimulation.  Programmed ventricular  stimulation was carried up in the RV apex at a base drive cycle length  of 409 milliseconds.  The S1, S2 and S1, S2 and S3 stimuli were  delivered with the resulting induction of a nonclinical unmapple VT.  E.  Rapid atrial pacing.  Rapid atrial pacing demonstrated complete  heart block.  F.  Programmed atrial stimulation.  Programmed atrial stimulation  demonstrated complete heart block.  G.  Mapping.  Mapping of the patient's VT demonstrated that it was a  left anterior fascicular VT.  H.  RF energy application.  Total of 6 RF energy applications were  delivered resulting in termination of the patient's VT.   CONCLUSION:  The study demonstrates successful electrophysiologic study  and RF catheter ablation of sustained incessant ventricular tachycardia.  It was  complicated by the development of complete heart block after the  left anterior fascicular block was carried out.      Doylene Canning. Ladona Ridgel, MD  Electronically Signed     GWT/MEDQ  D:  11/06/2008  T:  11/07/2008  Job:  [id number]   cc:   Duke Salvia, MD, [facility]  [name] [name], MD, [facility]

## 2010-09-14 NOTE — Assessment & Plan Note (Signed)
[facility]                         ELECTROPHYSIOLOGY OFFICE NOTE   SINAI, MAHANY                  MRN:          [id number]  DATE:12/18/2007                            DOB:          04/28/1956    Michael Krueger is seen for ventricular tachycardia and ischemic  cardiomyopathy.  He is status post catheter ablation for V/Q storm and  has been largely symptomatically quiescent.   He has been treated with concomitant amiodarone for which  hyperthyroidism developed that has been also relatively quiet.   He recently had an episode of chest pain that prompted him to go to the  [facility], where he was seen.  I also gathered that his  thyroid labs, and his lipids were also checked, and they were okay.   His medications currently include:  1. Amiodarone 200 a day.  2. Lipitor 80.  3. Carvedilol 12.5 b.i.d.  4. Niaspan.  5. Warfarin.  6. Aldactone 25.  7. Aspirin.  8. Furosemide 20.  9. Lanoxin 0.125.  10.Ramipril 2.5 b.i.d.   PHYSICAL EXAMINATION:  VITAL SIGNS:  His blood pressure today was 91/62  with a pulse of 60, and this is relatively typical, a little bit on the  low side.  NECK:  His neck veins were flat.  LUNGS:  Clear.  HEART:  Sounds were regular.  ABDOMEN:  Protuberant but soft.  EXTREMITIES:  Without edema.   Interrogation of his Medtronic ICD demonstrates a P-wave of 2 with an  impedance of 504; threshold of 2 volts at 0.1.  The R-wave was 2.7  (which is stable, impedance was 276 and threshold of 2 volts at 0.3.  High-voltage impedances were 41/52.  Battery voltage was 2.90, and there  were 28 episodes of ventricular tachycardia that have been clustered  overtime; there were bunch in April when he was using caffeinated  beverages and again some in July which he thinks we have been  correlating with the same).   IMPRESSION:  1. Ventricular tachycardia - treated with ATP with previous      ventricular  tachycardia ablation.  2. Status post implantable cardioverter-defibrillator for the above.  3. Ischemic heart disease with depressed left ventricular function.  4. Obesity.  5. Borderline blood pressure.  6. Amiodarone therapy for ventricular tachycardia.  7. Hyperthyroidism, now quiescent.   Michael Krueger is stable.  At this point, we will plan to see him again in  6 months' time and allow [facility] to follow his VT in the interim,  unless there is a significant __________.      Duke Salvia, MD, [facility]  Electronically Signed    SCK/MedQ  DD: 12/18/2007  DT: 12/19/2007  Job #: [id number]

## 2010-09-14 NOTE — H&P (Signed)
Michael Krueger, KAU           ACCOUNT NO.:  [id number]   MEDICAL RECORD NO.:  [id number]          PATIENT TYPE:  INP   LOCATION:  2106                         FACILITY:  [facility]   PHYSICIAN:  Bruce R. Juanda Chance, MD, FACCDATE OF BIRTH:  1955/11/20   DATE OF ADMISSION:  10/31/2008  DATE OF DISCHARGE:                              HISTORY & PHYSICAL   CARDIOLOGIST:  [name]. [name], MD, [facility].   ELECTROPHYSIOLOGIST:  Duke Salvia, MD, [facility]   No known drug allergies.   Presenting circumstance is my heart has been racing for the last 8  days.   HISTORY OF PRESENT ILLNESS:  [name] is a 55 year old male.  He has  his first myocardial infarction in 1988.  He has an ischemic  cardiomyopathy at his last cath, which was Sep 05, 2005.  His ejection  fraction was 10-15%.  He had a Myoview study in March 2009, ejection  fraction was 19% at that time.  The study also showed EXTENSIVE  anterior/septal/inferior/apical infarction.  No ischemia, however.   The patient has long history of ventricular tachycardias.  His first ICD  was implanted in 1999 or 2000.  He had a generator change in June 200.  It was a Medtronic InSync dual-chamber, he has had now.  He had a  history of ventricular tachycardia ablation on June 11, 2004.  Multiple inducible ventricular tachycardias were noted.  He has  chronotropic incompetence and received a second wire in his ICD in 2002.   Over the last 8 days, the patient has felt fatigued and bloated.  He has  had lots of belching.  He is anorexic.  He has in [place] more than 1 full  meal during the whole 8 days, he says.  He has increased abdominal pain  after he does eat.  He has had heart racing episodes as well during that  last 8 days.  They are very transient.  They last only seconds.  Usually, he is able to cough himself.  However, they become more  frequent.  Because of this, he is afraid to exert himself or fear of  inciting ventricular  tachycardia.   Telemetry strips here show an accelerated until idioventricular rhythm,  which was treated with antitachycardia pacing.  This occurs at a rate of  about 100 beats per minute.  He is having some chest pain lately, but  the abdominal complaint is actually more paramount.   MEDICATIONS:  1. Lipitor 80 mg daily at bedtime.  2. Amiodarone 200 mg daily, which he says he is taking faithfully.  3. Carvedilol 12.5 mg twice daily.  4. Coumadin.  This is on hold since his INR yesterday on October 30, 2008      was 6.6.  5. Enteric-coated aspirin 81 mg daily.  6. Furosemide 20 mg daily.  7. Altace 2.5 mg daily.  8. Lanoxin 0.125 mg daily.  9. Spironolactone 25 mg daily.  10.Niaspan 1 g at bedtime.   SOCIAL HISTORY:  He lives and [place], [place].  Does not smoke and  does not partake of alcoholic beverages.  However, he did have a  heavy  usage of alcoholic beverages in the past.  He is a disabled truck Statistician.   FAMILY HISTORY:  Mother is still living.  The patient did not say much  about her history.  Father died suddenly of a ruptured abdominal aortic  aneurysm.  He has 1 brother with diabetes and 1 sister with blood clots,  both are living.   REVIEW OF SYSTEMS:  No fevers, chills, night sweats, or adenopathy.  HEENT:  No nasal discharge, vertigo, photophobia, hearing difficulties,  hoarseness.  INTEGUMENT:  No rashes or nonhealing ulcerations.  CARDIOPULMONARY:  No increase above his baseline dyspnea.  No orthopnea,  no paroxysmal nocturnal dyspnea, and some increase in his edema lately.  He is feeling dizzy, which could be classified as presyncope, but has  never had a syncopal episode.  No claudication as far as chest pain  goes, he says that is much more of a musculoskeletal feeling, but it  could be anginal as well, noting the patient's past history.  UROGENITAL:  No dysuria.  No hematuria.  No frequency or nocturia 2-3  times.  GI:  No nausea,  vomiting, or diarrhea.  No hematemesis.  No  melena.  No bright red blood per rectum.  He is not actually  experiencing feelings of heartburn, although he does have an abdominal  complaint.  MUSCULOSKELETAL:  No arthralgias or effusions.  He does  complain of what he feels like a musculoskeletal chest discomfort.  PSYCHIATRIC:  No anxiety or depression.  NEUROLOGIC:  No weakness.  He  is fatigued in the last 8 days.   LABORATORY STUDIES:  Sodium 139, potassium 3.9, chloride 111, carbonate  24, BUN is 11, creatinine 0.94, and glucose 126.  White cells 8.9,  hemoglobin 15.4, hematocrit 44.7, and platelets 183.  TSH, Protime, BNP  are all pending.  Troponin-I study is 0.02.  CK is 177 and CK-MB 2.3.  Chest x-ray has been observed.  The lungs are clear.  He has a stable  cardiomegaly.  The leads of his cardioverter are in appropriate  position.  Electrocardiogram shows a wide complex regular rate and  rhythm with sinus rhythm and rate is 60.  He has Q-waves in II, III, and  aVF.   PHYSICAL EXAMINATION:  VITAL SIGNS:  Temperature 98.8, blood pressure  109/66, respirations 16, pulse 61, oxygen saturation 97% on 2 L.  GENERAL:  The patient is in no acute distress, comfortable and frequent  belching, alert and oriented x3.  NECK:  No carotid bruits auscultated, jugular venous distention at 4-cm.  LUNGS:  Clear to auscultation bilaterally.  HEART:  Regular rate and rhythm.  S4.  No murmur.  ABDOMEN:  Mildly distended.  Bowel sounds are present.  Nontender.  No  guarding.  EXTREMITIES:  No clubbing, cyanosis, or edema.  NEUROLOGIC:  Grossly intact.   IMPRESSION:  1. Admit with fatigue/bloating/anorexia/heart racing.  2. Documented for the last 8 days; ventricular tachycardia with      antitachycardia pacing.  The patient can also terminate these      tachycardias with coughing.  3. History of myocardial infarction.      a.     January 31, 2007.      b.     Anterolateral myocardial  infarction in 2001.  4. SEVERE ischemic cardiomyopathy.  Ejection fraction was 10-15% and      cath on Sep 05, 2005.  The stent in the proximal LAD was patent;  left circumflex had a mid stent which was patent; right coronary      artery was totaled, had a prior midpoint stent fed by right to      right collaterals.  5. Myoview study in March 2009, ejection fraction 19%.  Extensive      anterior, septal, inferior, and apical infarction.  6. Electrophysiology study on June 11, 2004; ventricular      tachycardia ablation of multiple inducible ventricular tachycardias  7. Implantable cardioverter-defibrillator.  The first 1 implanted in      1999 or 2000.      a.     Generator change in June 2005.  The patient now has a       Medtronic InSync dual-chamber ICD.  8. Amiodarone started in 2001 and Coumadin started 2001 for atrial      fibrillation.      a.     History of amiodarone-induced hyperthyroidism.  It would be       interesting to see whether his TSH is now.  9. Chronotropic incompetence.      a.     Right atrial lead added at a procedure in August 2002.  10.Dyslipidemia.  11.History of non-Hodgkin lymphoma.  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  13.Right bundle-branch block.  The patient presented to the emergency      room about a week ago with abdominal discomfort.  A CT of the      abdomen was taken at that time; no gallstones, no aneurysm, no      adenopathy, which is good for followup of his lymphoma.  The      patient had a previous splenectomy.   PLAN:  Admit the patient to step-down CCU.  A 2-D echocardiogram.  We  will start IV amiodarone, cycle enzymes,.  We will hold the Coumadin,  start IV heparin for INR less than 2.  We planned catheterization on  November 04, 2008.  The patient was already set up with a cath lab.      Maple Mirza, PA      Bruce R. Juanda Chance, MD, [facility]  Electronically Signed    GM/MEDQ  D:  10/31/2008  T:  11/01/2008  Job:  [id number]

## 2010-09-14 NOTE — Consult Note (Signed)
NAME:  TAHJAE, CLAUSING NO.:  [id number]   MEDICAL RECORD NO.:  [id number]          PATIENT TYPE:  EMS   LOCATION:  MAJO                         FACILITY:  [facility]   PHYSICIAN:  Therisa Doyne, MD    DATE OF BIRTH:  01/31/1956   DATE OF CONSULTATION:  11/09/2008  DATE OF DISCHARGE:                                 CONSULTATION   [facility] was asked to see [name] for evaluation of  weakness, bloating, and systolic blood pressure of 90.   CHIEF COMPLAINT:  Bloating, weakness, and systolic blood pressure in of  90.   HISTORY OF PRESENT ILLNESS:  Mr. Kubisiak is a 55 year old white male  with a past medical history significant for ischemic cardiomyopathy with  an ejection fraction of 10%, history of recurrent ventricular  tachycardia and atrial fibrillation who presents to the emergency  department for evaluation of bloating, weakness, and a systolic blood  pressure of 90.  Of note, the patient was discharged yesterday from  [facility].  He was hospitalized for 8 days for ventricular  tachycardia.  He had a cardiac catheterization performed on July 6,  which showed stable coronary artery disease.  On July 8, he underwent a  ventricular tachycardia ablation.  He did well and was discharged to  home on July 10.  He felt well yesterday, but then today began feeling  bloated and mildly nervous, and decided to come to the emergency  department for evaluation.  He denies any chest pain, shortness of  breath, PND, orthopnea, syncope, or firing of his ICD.   REVIEW OF SYSTEMS:  All systems are reviewed and are negative except as  mentioned above in the history of present illness.   PAST MEDICAL HISTORY:  1. Ischemic cardiomyopathy with an ejection fraction of 10%.  2. History of ventricular tachycardia, recurrent, status post      ventricular tachycardia ablation on November 06, 2008.  3. Coronary artery disease.  Cardiac catheterization November 04, 2008   showed nonobstructive disease in the left anterior descending      artery and circumflex artery.  There is 100% occlusion of the right      coronary artery with right to right collaterals.  4. Atrial fibrillation on amiodarone and Coumadin.  5. Amiodarone-induced hyperthyroidism.  6. Hyperlipidemia.  7. History of non-Hodgkin's lymphoma currently in remission.   MEDICATIONS:  I have personally reviewed these medications with the  patient and are correct as listed in the discharge summary from  yesterday.   REVIEW OF SYSTEMS:  All systems are reviewed and negative except as  mentioned above in history of present illness.   SOCIAL HISTORY:  The patient does not smoke or drink alcohol.  He is a  disabled truck Haematologist.   FAMILY HISTORY:  Positive for ruptured aortic aneurysm in his father,  who died suddenly.   PHYSICAL EXAM:  VITAL SIGNS:  Temperature afebrile, blood pressure  96/62, pulse 60, respirations 16, oxygen saturation 96% on room air.  GENERAL:  In no acute distress.  HEENT:  Normocephalic, atraumatic.  Oropharynx pink and moist  without  lesion.  NECK:  Supple.  No lymphadenopathy.  No jugular venous distension.  No  mass.  CARDIOVASCULAR:  Regular rate and rhythm.  No murmurs, rubs, or gallops.  CHEST:  Clear to auscultation bilaterally.  ABDOMEN:  Positive bowel sounds.  Soft, nontender, nondistended.  EXTREMITIES:  No cyanosis, clubbing, or edema.  Dorsalis pedis pulses  bilaterally.   LABORATORY DATA:  White blood cell count 14.8 which is stable.  Hemoglobin 14.2.  Platelets 178,000.  Sodium 132, potassium 3.9,  chloride 99, bicarb 26, BUN 15, creatinine 1, glucose 151, normal liver  function tests.  INR 1.2.  Digoxin less than 0.2.  Troponin 0.18.   EKG:  Showed sinus rhythm with a first degree AV block and nonspecific  intraventricular conduction delay.  Telemetry shows that the patient is  intermittently atrial paced and ventricular  paced.   ASSESSMENT AND PLAN:  Mr. Pfenning is a 55 year old white male with past  medical history significant for ischemic cardiomyopathy, ejection  fraction of 10%, cardiac catheterization on July 6 showing stable  coronary artery disease and recent ventricular tachycardia ablation on  July 8 who presents with bloating and systolic blood pressure of 90.  From a cardiovascular standpoint, he appears to be stable.  He has no  active signs of ischemia and does not appear to be in heart failure.  His device has not fired, which is reassuring.  His blood counts are  stable, so I do not suspect that he has any ongoing bleeding to account  for his symptoms.  He is requesting to go home, and I think is a  reasonable option from a cardiovascular standpoint.  While his systolic  blood pressures are in the 90s, I think these are stable compared to his  trend while he was in the hospital.  His ejection fraction was only 10%,  so I do not expect him to have systolic blood pressures much above 110-  120.  I would recommend checking an abdominal x-ray just to rule out  obstruction or postoperative ileus.  Otherwise, I see no indication for  him to be admitted from a cardiovascular standpoint.   Thank you for allowing [place] to participate in the care of this patient.  Please feel free to contact us if you have any further questions.      Therisa Doyne, MD  Electronically Signed     SJT/MEDQ  D:  11/09/2008  T:  11/09/2008  Job:  [id number]

## 2010-09-14 NOTE — Assessment & Plan Note (Signed)
[facility]                            CARDIOLOGY OFFICE NOTE   Michael Krueger, Michael Krueger                  MRN:          [id number]  DATE:01/25/2007                            DOB:          11-Jul-1955    [name] is a pleasant gentleman who has a history of coronary  disease, ischemic cardiomyopathy, status post ICD, history of  ventricular tachycardia status post ablation as well as amiodarone  therapy.  Since I last saw him, he is doing well.  There is minimal  dyspnea on exertion but there is no orthopnea, PND, or pedal edema.  He  has not had chest pain, palpitations, or syncope.  His defibrillator has  not fired.  He is complaining of some sinusitis.   MEDICATIONS:  1. Amiodarone 200 mg p.o. daily.  2. Coumadin as directed and followed in [place].  3. Folate.  4. Aspirin 81 mg p.o. daily.  5. Aldactone 25 mg p.o. daily.  6. Lanoxin 0.125 mg p.o. daily.  7. Niaspan 1 gram p.o. daily.  8. Altace 5 mg p.o. b.i.d.  9. Lasix 20 mg p.o. daily.  10.Lipitor 40 mg p.o. daily.  11.Coreg 12.5 mg p.o. b.i.d.   PHYSICAL EXAMINATION:  VITAL SIGNS:  Show a blood pressure of 111/74 and  his pulse is 66.  He weighs 175 pounds.  HEENT:  Normal.  NECK:  Supple without no bruits.  CHEST:  Clear.  CARDIOVASCULAR:  Reveals a regular rate and rhythm.  ABDOMEN:  Benign.  EXTREMITIES:  Show no edema.   DIAGNOSES:  1. Coronary artery disease.  [name] has not had chest pain or      increasing shortness of breath.  We will continue with medical      therapy including his aspirin, ACE inhibitor, beta-blocker, and      Statin.  2. Ischemic cardiomyopathy.  He will continue on his present      medications including his ACE inhibitor, beta-blocker, digoxin, and      diuretics.  3. Status post implantable cardioverter-defibrillator.  This is      followed by Dr. Graciela Husbands.  4. History of ventricular tachycardia ablation.  5. History of amiodarone-induced  hyperthyroidism, previously followed      by Dr. Talmage Nap.  Given his amiodarone use, we will check laboratories      today including his liver, TSH, and chest x-ray.  I will also check      a B-MET today, CBC, and lipids.  6. Coumadin therapy.  This is being monitored in [place].  7. Hyperlipidemia.  He will continue on his Statin and we will check      his lipids and liver and adjust as indicated.  8. History of Hodgkin's.   We will see him back in 6 months.     [name] [name], MD, [facility]  Electronically Signed    BSC/MedQ  DD: 01/25/2007  DT: 01/25/2007  Job #: [id number]

## 2010-09-14 NOTE — Assessment & Plan Note (Signed)
[facility]ALTH[facility]                                 ON-CALL NOTE   Michael Krueger, Michael Krueger                    MRN:          [id number]  DATE:05/07/2007                            DOB:          1955-06-05    On call documentation on Michael Krueger. Michael Krueger, record #[id number], date  of birth 08-04-1955.  Supervising physician Dr. Antoine Poche, primary  cardiologist Dr. [name].   BRIEF HISTORY:  [name] is a 55 year old male who initially called  the answering service at approximately 5:30.  Alpha page from the  answering service stated that his pacemaker got too close to a magnet.  I attempted to call him four times over the next 45 minutes, and he did  not answer his phone.  The fourth time I left a message Asking him to  call the answering service if he needed to continue to talk to [place] and to  please answer his phone.  He did call back at approximately 1815.  On  returning [name] call he stated that his friend put a speaker  magnet up to his defibrillator, and the defibrillator began beeping.  He  could not tell me specifically what time this was or as to why he  allowed his friend to do this.  He stated that it was not beeping at  this time, but he was not sure what to do.  I questioned him in regards  to his medical history, and the patient could not explain to me why he  had a defibrillator placed.   I explained to him on Saturday evening the only way to have his device  checked would be to come to the emergency room.  I would notify the  company and the emergency room of his pending arrival so that they may  interrogate his device to assure its functioning status.  The patient  was agreeable with that plan.  Thus I contacted [employer] and informed  Jonny Ruiz of the above. The emergency room would call [employer]/[name] once the  patient had arrived.   Later on, on reviewing with the fellow the patient did appear in the  emergency room, and the  [employer] rep did interrogate the defibrillator  and was found to be functioning normally.      Joellyn Rued, PA-C  Electronically Signed      Rollene Rotunda, MD, [facility] [place]  Electronically Signed   EW/MedQ  DD: 05/07/2007  DT: 05/07/2007  Job #: [id number]

## 2010-09-14 NOTE — Assessment & Plan Note (Signed)
[facility]ALTHCARE                            CARDIOLOGY OFFICE NOTE   REG, BIRCHER                  MRN:          [id number]  DATE:11/19/2007                            DOB:          October 27, 1955    Michael Krueger is a gentleman who has a history of coronary artery  disease, ischemic cardiomyopathy, history of ICD, history of ventricular  tachycardia status post ablation, brief history of atrial fibrillation,  and amiodarone therapy.  His most recent Myoview was performed on July 23, 2007.  His ejection fraction was 19%.  There is a prior extensive  anterior, septal, inferior, and apical infarct,  but there was no  ischemia.  He apparently was recently admitted to [facility] with atypical chest pain and throat pain.  He states it was not  like his MI pain.  During that admission, he did have enzymes checked  which were normal.  His sodium was 140 with potassium of 4.  His BUN and  creatinine were 40 and 1.17 respectively.  He also had liver functions  checked that were normal.  The date of these were October 27, 2007.  A TSH  was normal at 2.3.  He also had a total cholesterol checked which was  106 with an HDL of 32 and an LDL 63.  He also had a chest x-ray  performed that showed no active disease.  Note, during that admission  his blood pressure was apparently running low, and his Altace was  decreased to 2.5 mg twice a day and his Coreg was decreased to 6.25 mg  p.o. b.i.d.  Since discharge, he has done well with no chest pain,  shortness of breath, palpitations, or syncope.   MEDICATIONS:  1. Amiodarone 200 mg p.o. daily.  2. Spironolactone 25 mg p.o. daily.  3. Lasix 20 mg p.o. daily.  4. Niaspan 1 g p.o. daily.  5. Lanoxin 125 mcg p.o. daily.  6. Coumadin as directed.  7. Lipitor 80 mg p.o. daily.  8. Altace 2.5 mg p.o. b.i.d.  9. Coreg 6.25 mg p.o. b.i.d.  10.Aspirin 81 mg p.o. daily.   PHYSICAL EXAMINATION:  VITAL  SIGNS:  Today, blood pressure 110/70 and  pulse is 61.  He weighs 173 pounds.  HEENT:  Normal.  NECK:  Supple.  CHEST:  Clear.  CARDIOVASCULAR:  Regular rate and rhythm.  ABDOMEN:  No tenderness.  EXTREMITIES:  No edema.   His electrocardiogram shows an atrial paced rhythm with a first-degree  AV block.  There is right bundle-branch block as well as an anterior  infarct.  There is a prior inferior infarct.   DIAGNOSES:  1. Coronary artery disease - Michael Krueger has had no further chest      pain, and his Myoview back in March showed no ischemia.  We will      not pursue further ischemia evaluation at this point.  He will      continue on his aspirin, Coreg, ACE inhibitor, statin, and Niaspan.      He will also continue on his Coumadin as he does have a  remote      history of atrial fibrillation.  2. Ischemic cardiomyopathy - I will increase his Coreg to 12.5 mg p.o.      b.i.d.  3. Status post implantable cardioverter-defibrillator - management per      Electrophysiology.  4. History of ventricular tachycardia ablation.  5. Remote history of atrial fibrillation - he will continue on his      Coumadin.  6. History of amiodarone-induced hyperthyroidism - his most recent TSH      was normal.  7. Coumadin therapy.  8. Hyperlipidemia - he will continue on his Lipitor and his Niaspan.  9. History of Hodgkin disease.   I will see him back in 6 months.     [name] Michael Som, MD, [facility]  Electronically Signed    BSC/MedQ  DD: 11/19/2007  DT: 11/19/2007  Job #: [id number]

## 2010-09-14 NOTE — Op Note (Signed)
NAMEKEWAN, MCNEASE           ACCOUNT NO.:  [id number]   MEDICAL RECORD NO.:  [id number]          PATIENT TYPE:  INP   LOCATION:  2922                         FACILITY:  [facility]   PHYSICIAN:  Duke Salvia, MD, FACCDATE OF BIRTH:  07-02-1955   DATE OF PROCEDURE:  12/17/2008  DATE OF DISCHARGE:                               OPERATIVE REPORT   PREOPERATIVE DIAGNOSES:  Congestive heart failure, complete heart block,  device dependence, previously implanted implantable cardioverter  defibrillator.   POSTOPERATIVE DIAGNOSES:  Congestive heart failure, complete heart  block, device dependence, previously implanted implantable cardioverter  defibrillator; fracture rate sense portion of the left ventricular lead,  insulation breech of the atrial lead.   PROCEDURES:  Contrast venogram, insertion of a left ventricular pacing  lead and a new defibrillator with high-voltage testing, insertion of a  new right ventricular paced sense lead, repair of the atrial lead, and  pocket revision.   SURGEON:  Duke Salvia, MD, [facility]   DESCRIPTION OF PROCEDURE:  Following obtaining informed consent, the  patient was brought to the electrophysiology laboratory and placed on  the fluoroscopic table in supine position.  After routine prep and drape  of the left upper chest, lidocaine was infiltrated along the line of the  previous incision and carried down to the layer of the device pocket.  A  contrast venogram having demonstrated patency of the extrathoracic left  subclavian vein.  We were with the micropuncture kit able to access the  vein and the LV lead was placed prior to the opening of the device  pocket.  The vein was accessed.  A 9-French dilator was placed.  It was  hard to place a 9-French sheath and so we put a Amplatz extra stiff wire  in which allowed for placement of a long 9-French sheath that passed  past beyond the narrowing at the innominate SVC junction.  Through this,  we passed  a Medtronic MB2 coronary sinus cannulation catheter which in  conjunction with a Wholey wire allowed for rapid cannulation of the  coronary sinus.  There was a bifurcated branch in the mid lateral  position and we wired both branches.  The more superior branch was our  target, however, it only coursed to the mid position of the ventricle  from base to apex and after some deliberation and hope that the lead  would stay with removal of the outer sheath but not the MB2 sheath.  There was retro movement of the lead and it was decided that this lead  position was unstable.  We then cannulated the lower branch and passed a  4196 [employer]onic lead.  Unfortunately, it passed near the apical portion  and it was not stable in a more proximal position.  We then took the  lead out and put a St. Jude 1258 lead, serial number [id number] to a  position at the junction between the mid and distal third.  In this  position, the sensed L-wave was 30, a pace impedance of 96, and a  threshold 0.9 volts at 0.5 milliseconds.  There was no diaphragmatic  pacing at  10 volts.   This lead was secured to the prepectoral fascia after the removal of the  MB2 sheath.   We then went to free up the device.  As we freed up the device, two  things were noted.  One was that there was some heme discoloration of  the right atrial lead as well as some heme discoloration of the rate  sense portion.  The atrial lead was repaired with silicone.  I actually  then put silicone to prolong the rate sense portion of the RV lead.   I should note that there was striking retained formation of the  defibrillator yoke and leads which we tried to preserve as we put it  into the device, but the bends were actually perpendicular to the line  and with the retained bends, the yoke was twisted to 180 degrees along  the rate sense lead.  I then compromised and put it back 90 degrees and  as we manipulated the lead, we found that we had inhibition  of pacing or  reflecting oversensing of the right ventricular rate sense portion in a  device dependent patient, this situation was considered unstable.  Because of that, a right ventricular lead Medtronic 5076, 58-cm lead,  serial #[id number] was delivered to the right ventricular apex where the  bipolar sensed R-wave was initially 10 with a pace impedance of 1143, a  threshold 1.4 volts at 0.5 milliseconds, a current of threshold 1.6 mA,  and the current of injury was very brisk.  There was then a fall of the  RV amplitude.   The previously implanted atrial lead had an amplitude of 2.8 with a pace  impedance of 558, a threshold of 0.5 at 0.5, and current of threshold  0.7 mA.  The pocket was expanded to allow for the device, the floor of  the pocket was excised, and the leads were freed up from the floor the  pocket.   The leads were then attached to Medtronic Concerta II CRT ICD, model  #D274TRK.  Serial number was [id number].  There was no intrinsic atrial  or ventricular lead measurable.  The RA impedance was 494 with a  threshold of 0.5 at 0.4, the RV impedance was 684 with a threshold 1  volt at 0.4, and the LV impedance was 1064 with a threshold of 1 volt at  0.4.  It was elected to do a high-voltage shock to test the integrity of  the high-voltage coils but because the patient's heart failure as in  class IIIB-IVA, it was elected to defer DFT testing.  What we did is we  did a induction trained at 400 coiling 200, so that the 1 joule shock  was delivered into the proximal portion of the T-wave, so as to avoid  induction of VF.  The high-voltage impedance was 47 ohms.  At this  point, the device was implanted.  The pocket was copiously irrigated  with antibiotic containing saline solution.  Hemostasis was assured.  The leads and pulse generator were placed in the pocket.  The right  ventricular rate sense portion of the defibrillator lead was capped and  placed behind the  device.  The device was secured to the prepectoral  fascia with care that the RV rate sense portion was behind the device  and below the suture and no Surgicel was placed on the anterior-  posterior aspect of the pocket.  The wound was closed in 3 layers in  normal fashion.  The wound as washed, dried, and a benzoin and Steri-  Strip dressing was applied.  Needle counts, sponge counts, and  instrument counts were correct at the end of the procedure according to  the staff.  The patient tolerated the procedure without apparent  complication.      Duke Salvia, MD, [facility]  Electronically Signed     SCK/MEDQ  D:  12/17/2008  T:  12/18/2008  Job:  [id number]

## 2010-09-14 NOTE — Assessment & Plan Note (Signed)
[facility]                         ELECTROPHYSIOLOGY OFFICE NOTE   COBURN, KNAUS                  MRN:          [id number]  DATE:11/15/2006                            DOB:          Jul 16, 1955    HISTORY:  This patient is seen and he has done really well of late.  He  has no complaints of chest pain or shortness of breath, and has been  unaware of any ventricular tachycardia.  His medications are reviewed  and he continues to be on amiodarone, aspirin, Lanoxin 0.125, aldactone  25, Niaspan, Altace, Lasix, Lipitor, and Coreg at a dose of 12.5 twice a  day.   PHYSICAL EXAMINATION:  VITAL SIGNS:  On examination today his blood  pressure is 107/60.  His pulse is 64.  His weight is 177 pounds, which  is stable.  LUNGS:  The patient's lungs are clear.  HEART:  The heart sounds are regular without murmurs or gallops.  ABDOMEN:  The abdomen is protuberant.  EXTREMITIES:  The extremities are without edema.   LABORATORY DATA:  Electrocardiogram dated today demonstrates atrial  pacing with right bundle branch block and right axis deviation, with  evidence of a prior anterior wall myocardial infarction.  Interrogation  of his Medtronic 7288 ICD demonstrated a P wave of 2.7, an impedance of  504 and a threshold of two volts at 0.2.  The R wave was also 2.7 with  an impedance of 280 and a threshold of two volts at 0.2.  This is  relatively consistent although there has been some diminution since  December 2007.   IMPRESSION:  1. Ventricular tachycardia with recurrent storm status post      ventricular tachycardia ablation.  2. Ischemic heart disease.      a.     Prior myocardial infarction.      b.     ejection fraction of 10-15% by angiography in May 2007 with       global left ventricular dysfunction.  3. Obesity.  4. Amiodarone therapy.  5. Hyperthyroidism related to the above, now quiescent, followed by      Dr. Talmage Nap.   The patient's  ventricular tachycardia is stable.  He is tolerating his  amiodarone, but amiodarone surveillance laboratories will need to be  checked; these will need to be checked every six months.  We will also  check his digoxin level as it is quite high for his amiodarone dose.   The patient has poor atrioventricular conduction as well as intrinsic  ventricular conduction; so, symptoms of heart failure in him might be  somewhat ameliorated by consideration of CRT  upgrade.  I will leave this up to Dr. [name] with whom he will meeting  with in the next couple months.     Duke Salvia, MD, [facility]  Electronically Signed    SCK/MedQ  DD: 11/15/2006  DT: 11/16/2006  Job #: [id number]

## 2010-09-17 NOTE — Cardiovascular Report (Signed)
[place]  Patient:    Michael Krueger, Michael Krueger                  MRN: [id number] Proc. Date: 01/03/00 Adm. Date:  [id number] Attending:  Carmelina Peal CC:         [name]. [name], M.D. [facility]  [place] CV [facility]   Cardiac Catheterization  INDICATIONS:  The patient is well-known to [place].  He has a history of severe LV dysfunction and prior percutaneous intervention.  His last study was in the year 2000.  He has continued to smoke.  His EF is known to be in the 15-20% range.  His EKG now shows some anterolateral ST elevation and he is brought to the lab emergently for further evaluation.  PROCEDURES 1. Left heart catheterization. 2. Selective coronary arteriography. 3. Selective left ventriculography. 4. Insertion of an intra-aortic balloon pump. 5. Percutaneous transluminal coronary angioplasty and stenting of the left    anterior descending artery.  DESCRIPTION OF PROCEDURE:  The patient was brought to the cath lab and prepped and draped in usual fashion.  Through an anterior puncture, the right femoral artery was easily entered.  Views of the left and right coronary arteries were obtained in multiple angiographic projections and ventriculography was performed in the RAO projection.  Because of a severe LV dysfunction and an elevated LVEDP, it was our feeling that we should proceed on with intra-aortic balloon pumping prior to percutaneous intervention.  We attempted to place the intra-aortic balloon pump from the left groin but had trouble getting the sheath through the artery.  We were able to get an introducer through quite easily but were unable to get the balloon pump sheath through.  As a result, we elected to place a 9-French sheath in the left femoral artery.  We then placed an intra-aortic balloon pump through the previously placed right entry site.  Intra-aortic balloon pumping was commenced at 1 to 2.  We then checked an ACT and heparin  was given according to protocol.  Integrilin was utilized. A JL-3.5 guiding catheter was utilized and the lesion was crossed eventually with a 0.014 Hi-Torque intermediate wire; the floppy would not cross.  We initially dilated using a 275 Quantum Ranger 15-mm length balloon.  We then stented using a 3-mm AVE 15-mm stent, with inflations up to 13 atmospheres. Because of very mild compromise of the diagonal, we elected to do no post-stent dilatations.  There was reestablishment of TIMI-3 flow and relief of pain.  The procedure was completed.  All catheters were removed.  Femoral sheaths were sewn into place.  He was taken to the holding area in satisfactory clinical condition.  HEMODYNAMIC DATA:  The central aortic pressure was 190/75; LV pressure 132/32. There was no gradient on pullback across the aortic valve.  ANGIOGRAPHIC DATA:  The left main coronary artery is free of critical disease.  The left anterior descending artery is totally occluded beyond the origin of the major diagonal.  Following reperfusion, this is reduced to less than 10% residual luminal narrowing.  More distally, there is a long area of diffuse 60-70% narrowing but TIMI-3 flow.  There was a large second diagonal.  The first diagonal had no significant compromise.  There was perhaps 30% narrowing after the balloon dilatation of the LAD.  The circumflex is a vessel that had been previously stented.  The stent site demonstrates about 30% narrowing.  There is a tiny marginal branch that has  about 70% ostial narrowing.  The large marginal branch, which has been previously rotablated, had about a 70% area of narrowing, followed by a 50% area of narrowing slightly more distally.  The A-V circumflex is without critical disease.  The right coronary artery is diffusely diseased in its proximal portion and totally occluded.  The distal vessel fills by bridging and left-to-right collaterals.  Ventriculography in the RAO  projection reveals severe reduction in global left ventricular function.  Ejection fraction would be estimated in the 20% range. The anterolateral apical segment is akinetic.  A distal aortogram was done with minimal contrast to document patency for the intra-aortic balloon pump insertion.  We could not adequately visualize the various structures, although the renal arteries appeared to be patent bilaterally; likewise, there was no obvious critical disease.  CONCLUSIONS 1. Acute anterolateral wall myocardial infarction in the setting of    long-standing ischemic and nonischemic cardiomyopathy, with successful    percutaneous stenting of the left anterior descending artery as described    above. 2. Known prior percutaneous intervention of the circumflex coronary artery. 3. Known prior intervention of the right coronary artery with subsequent late    reocclusion.  DISPOSITION:  The patient will be treated medically.  Of note, the patient has had a defibrillator implanted previously. DD:  01/03/00 TD:  01/04/00 Job: [id number]

## 2010-09-17 NOTE — H&P (Signed)
NAME:  Michael Krueger, Michael Krueger           ACCOUNT NO.:  [id number]   MEDICAL RECORD NO.:  [id number]          PATIENT TYPE:  OIB   LOCATION:  2899                         FACILITY:  [facility]   PHYSICIAN:  Duke Salvia, M.D.  DATE OF BIRTH:  12/12/1955   DATE OF ADMISSION:  02/16/2004  DATE OF DISCHARGE:                                HISTORY & PHYSICAL   PRIMARY CARE PHYSICIAN:  Dr. Dorisann Frames.   CHIEF COMPLAINT:  I am here for ablation.   HISTORY OF PRESENT ILLNESS:  Mr. Borquez is a 55 year old male who  experienced his first myocardial infarction when he was in his 30s.  He  currently has a history of ischemic cardiomyopathy with ejection fraction 20-  25%, history of prior myocardial infarction, stent to the proximal LAD,  stent to the circumflex.  He has a history of recurrent ventricular  tachycardia for which ICD was placed.  He had atrial lead upgrade in 2002.  On January 16, 2004, he presented to [facility] with ICD  discharge.  He was subsequently discharged to [facility] and he  underwent left heart catheterization on September 20.  This demonstrated  that the proximal LAD stent was patent.  The proximal stent to the left  circumflex was patent.  The RCA had a 100% mid point stenosis with bridging  collaterals.  The patient had discontinued amiodarone 3 weeks prior to that  secondary to thyrotoxicosis, although he had no clinical symptoms of  hyperthyroidism.  He was seen by Dr. Sherryl Manges at that hospitalization  and discussion of ventricular tachycardia ablation was discussed.  The  patient was interested and the plan was for him to return in 4 weeks.  He is  now presenting for ventricular tachycardia ablation study with both Dr.  Graciela Husbands and Dr. Ladona Ridgel in attendance.  He has had no chest pain since his  admission in mid September.  He has had no refiring of his ICD.  The patient  has history of dyslipidemia, Class II congestive heart failure  symptoms.  His thromboembolic risk factors include cardiomegaly, history of a prior  myocardial infarction, known coronary artery disease.  Cardiolite on October 06, 2003, with massive scarring involving the septum, anterior wall, inferior  wall and apex with no ischemia.  Echocardiogram on January 19, 2004, with  ejection fraction 20-25%, severe hypokinesis of the anterolateral wall,  akinesis of the septal wall and apex.   ALLERGIES:  No known drug allergies.   MEDICATIONS:  1.  Coumadin, per pharmacy.  2.  Aspirin 81 mg daily.  3.  Aldactone 25 mg daily.  4.  Lanoxin 0.125 mg daily.  5.  Coreg 12.5 mg twice daily.  6.  Niaspan 1000 mg at bedtime.  7.  Altace 5 mg twice daily.  8.  Lipitor 20 mg at bedtime.  9.  Lasix 20 mg daily.   SOCIAL HISTORY:  The patient lives in [place].  He is a retired Ecologist and does not partake of alcoholic beverages, tobacco or recreational  drugs.   FAMILY HISTORY:  Mother is alive and well.  Father alive and well.  He has  one brother with diabetes who is living.  One sister living with history of  deep venous thrombosis.   REVIEW OF SYSTEMS:  CONSTITUTIONAL:  The patient is not complaining of  recent history of fevers, chills, night sweats, weight change or adenopathy.  HEENT:  No epistaxis, voice changes, vertigo or photophobia.  INTEGUMENTARY:  No rashes, lesions, nonhealing ulcerations.  CARDIOPULMONARY:  No recent  chest pain.  His dyspnea symptoms are no more than usual.  He is mildly  dyspneic on exertion.  He does not have orthopnea or paroxysmal nocturnal  dyspnea.  He has mild edema.  He has claudication after 200 feet of  ambulation.  He has a history of presyncope, but no frank syncope and  palpitation.  GENITOURINARY:  No nocturia or dysuria.  NEUROLOGIC:  No  history of neurologic deficit.  GASTROINTESTINAL:  No chronic heartburn.  No  history of GI bleeding.  ENDOCRINE:  No history of hypothyroidism or  diabetes.   MUSCULOSKELETAL:  No complaints of arthralgias, joint swelling or  deformity.  All other systems were negative.   PHYSICAL EXAMINATION:  VITAL SIGNS:  Temperature 98, pulse 68 and regular,  blood pressure 116/73, respirations 20, oxygen saturations 95% on room air.  GENERAL:  The patient was alert and oriented x3, mildly apprehensive about  the upcoming procedure.  HEENT:  Normocephalic, atraumatic.  Pupils equal round and reactive to  light.  Extraocular movements intact.  Sclerae are clear.  Nares without  discharge.  NECK:  Supple.  No carotid bruits auscultated.  No jugular venous  distention.  HEART:  Regular rate and rhythm without murmurs, gallops or rubs.  LUNGS:  Clear to auscultation and percussion bilaterally.  ABDOMEN:  Soft, nondistended.  Bowel sounds are present.  No rebound or  guarding.  No hepatosplenomegaly.  EXTREMITIES:  No evidence of clubbing, cyanosis or edema.  No nonhealing  ulcerations.  MUSCULOSKELETAL:  No obvious joint deformity, effusion, kyphosis or  scoliosis.  NEUROLOGIC:  No neurologic deficits noted.   LABORATORY DATA AND X-RAY FINDINGS:  EKG with wide QRS with evidence of old  inferior myocardial infarction.  The patient is in sinus rhythm.  Chest x-  ray not detailed.   Laboratory studies taken approximately 1 week ago with sodium 140, potassium  4.1, chloride 106, carbonate 28, BUN 16, creatinine 1.2, glucose 129.  Complete blood count with white cells 9.8, hemoglobin 14.9, hematocrit 35.7,  platelets 266.   IMPRESSION:  1.  Recurrent ventricular tachycardia with recent hospitalization for      implantable cardioverter-defibrillator discharge after discontinuing      amiodarone.  2.  Coronary artery disease with history of myocardial infarction, status      post stents to both left anterior descending and circumflex, both patent      on catheterization on January 20, 2004. 3.  Ischemic cardiomyopathy with ejection fraction 20-25% on  echocardiogram      on January 19, 2004.  4.  Cardiolite in June 2005, negative for ischemia.  5.  Dyslipidemia.  6.  Class II congestive heart failure symptoms.  7.  History of Hodgkin's.  8.  Status post implantable cardioverter-defibrillator with atrial lead      upgrade in 2002, by Dr. Sherryl Manges.   PLAN:  For VT ablation on February 16, 2004.       GM/MEDQ  D:  02/16/2004  T:  02/16/2004  Job:  [id number]

## 2010-09-17 NOTE — Discharge Summary (Signed)
NAME:  Michael Krueger, Michael Krueger                     ACCOUNT NO.:  [id number]   MEDICAL RECORD NO.:  [id number]                   PATIENT TYPE:  INP   LOCATION:  3739                                 FACILITY:  [facility]   PHYSICIAN:  Willa Rough, M.D.                  DATE OF BIRTH:  August 11, 1955   DATE OF ADMISSION:  10/11/2002  DATE OF DISCHARGE:  10/12/2002                           DISCHARGE SUMMARY - REFERRING   SUMMARY OF HISTORY:  Michael Krueger is a 55 year old white male who developed  tachy-palpitations on the day of admission around 11 a.m. while vacuuming.  Since then, he has felt shortness of breath and is now orthopneic.  He  denies any chest discomfort, nausea or diaphoresis.  He feels that he is  lightheaded, but he has not had any syncope.  He has been compliant with his  medications and his diet.  In the emergency room, his EKG showed a wide  complex tachycardia of 122.  A chest x-ray was unremarkable.  His history is  notable for an ischemic cardiomyopathy with a known EF of 27%, PAF, CHF,  coronary artery disease, last catheterization in October of 2002, status  post AICD and permanent pacer, ventricular tachycardia, sick sinus syndrome,  Hodgkin's disease, hyperlipidemia, amiodarone treatment, Coumadin treatment  and status post splenectomy, remote tobacco use.   LABORATORY DATA:  Serial three CK-MBs and troponins were negative for  myocardial infarction.  Admission sodium was 140, potassium 4.3, BUN 16,  creatinine 1.2, normal LFTs.  PT 25.4, INR 2.8, D-dimer less than 0.22.  H&H  14.5 and 41.4.  MCV was slightly elevated at 102.5.  Platelets 187,000.  WBC  was 102.   EKG showed wide complex tachycardia with a right bundle branch block  pattern.   HOSPITAL COURSE:  While being seen in the emergency room, it was felt that  his rhythm was ventricular tachycardia which terminated spontaneously.  Dr.  Ladona Ridgel reviewed and ICD was interrogated.  It was felt that his  presenting  rhythm during interrogation was ventricular tachycardia with a rate of 120  beats per minute.  During interrogation, the patient's ventricular  tachycardia self-terminated.  The patient states feeling weak and short of  breath for the preceding four hours.  The device did not treat the  ventricular tachycardic rhythm because VT detection was set to 130.  Per Dr.  Lubertha Basque request, VT detection was decreased to 120 beats per minute and the  patient's upper sensor rate was limited to 100 beats per minute.  Device  settings were placed in the chart.  The device did not record the event  because the tachycardic rate was below detection.  There were no other  events noted on the device.  Dr. Ladona Ridgel noted that if the patient was  feeling better the following morning, that he could be discharged home.  On  the morning of October 12, 2002, he was ambulating in  the hall without  difficulty.  He did not have any further problems with tachycardia or  weakness, thus he was discharged home.   DISCHARGE DIAGNOSES:  1. Ventricular tachycardia with reprogramming of his  implantable     cardioverter-defibrillator.  2. History as previously.   DISPOSITION:  He is asked to continue his home medications; these include:  1. Folic acid 1 mg daily.  2. Coumadin 1.24 mg daily and 2.5 mg Monday, Wednesday and Friday.  3. Aspirin 81 mg daily.  4. Aldactone 25 mg daily.  5. Digoxin 0.125 mg daily.  6. Niaspan 1 g daily.  7. Coreg 12.5 mg b.i.d.  8. Amiodarone 200 mg daily.  9. Altace 5 mg b.i.d.  10.      Lipitor 40 mg q.h.s.  11.      Lasix 20 mg daily.   DIET:  He was advised to continue a low-salt, low-fat, low-cholesterol diet.   FOLLOWUP:  Keep his PT/INR appointment and to please call the office on  Monday to arrange followup with Dr. Duke Salvia.      Joellyn Rued, P.A. [facility]                    Willa Rough, M.D.    EW/MEDQ  D:  10/12/2002  T:  10/13/2002  Job:  [id number]   cc:    Duke Salvia, M.D.   Phineas Real M.D.  [facility]  [place], [place]

## 2010-09-17 NOTE — Assessment & Plan Note (Signed)
[facility]                         ELECTROPHYSIOLOGY OFFICE NOTE   TAIMUR, [name]                  MRN:          [id number]  DATE:04/10/2006                            DOB:          1955-09-04    DEFIBRILLATOR NOTE:  [name] was seen today in the clinic on  April 10, 2006 for followup of his Medtronic model number 7288  Intrinsic.  Date of implant was October 28, 2003.  On interrogation of his  device today, his battery voltage is 3.07 with a charge time os 7.72  seconds.  P waves measured 2.6 mg with an atrial capture threshold of 2  volts at 0.2 ms and an atrial lead impedance of 504.  R waves measured  3.4 mV with a ventricular capture threshold of 2 volts at 0.3 ms and a  ventricular lead impedance of 296.  Shock impedance was 45.  There were  2 non-sustained episodes and 7 VT episodes requiring ATP therapy with  success.  No changes were made in his parameters.  There were also 2  mode switch episodes noted.  He will send a [facility] transmission at 3,  6 and 9 months time, with a return office visit in 1 year.      Altha Harm, LPN  Electronically Signed      Duke Salvia, MD, [facility]  Electronically Signed   PO/MedQ  DD: 04/10/2006  DT: 04/10/2006  Job #: [id number]

## 2010-09-17 NOTE — Assessment & Plan Note (Signed)
[facility]                              CARDIOLOGY OFFICE NOTE   MILOH, ALCOCER                  MRN:          [id number]  DATE:11/18/2005                            DOB:          05/24/1955    Michael Krueger is a 55 year old gentleman who has an ischemic cardiomyopathy,  status post ICD as well as history of ventricular tachycardia, status post  ablation and on amiodarone therapy.  He underwent a cardiac catheterization  recently secondary to ICD discharges.  His ejection fraction was 10-15% with  1+ mitral regurgitation.  The stent in the LAD was widely patent.  There was  multiple 30-40% lesions distally.  The stent on the circumflex was also  widely patent.  Just distal to the stent, there was an extremely small 1 mm  first obtuse marginal with a 70% ostial stenosis.  The right coronary artery  was occluded but there was right to right collaterals.  He has been treated  medically. Since I last saw him, there is no dyspnea, chest pain,  palpitations or syncope.  His defibrillator is not fired.   MEDICATIONS:  1.  Amiodarone 200 mg p.o. daily.  2.  Coumadin as directed.  3.  Aspirin 81 mg p.o. daily.  4.  Aldactone 25 mg p.o. daily.  5.  Lanoxin 0.125 mg p.o. daily.  6.  Niaspan 1 mg p.o. daily.  7.  Altace 5 mg p.o. b.i.d.  8.  Lasix 20 mg p.o. daily.  9.  Lipitor 40 mg p.o. daily.  10. Coreg 12.5 p.o. b.i.d.   PHYSICAL EXAMINATION:  VITAL SIGNS:  Blood pressure 100/69, pulse 62.  He  weighs 175 pounds.  NECK:  Supple with no bruits.  CHEST:  Clear.  CARDIOVASCULAR:  Regular rate and rhythm.  EXTREMITIES:  No edema.   LABORATORY DATA:  Electrocardiogram shows atrial paced rhythm with a right  bundle branch block and there is evidence of prior anterior infarct.   DIAGNOSES:  1.  Coronary artery disease.  2.  Ischemic cardiomyopathy.  3.  Status post implantable cardioverter defibrillator.  4.  History of ventricular  tachycardia ablation.  5.  History of amiodarone-induced hyperthyroidism previously followed by Dr.      Talmage Nap.  6.  Coumadin therapy.  7.  Hyperlipidemia.  8.  History of Hodgkin's.   PLAN:  Mr. Selley is doing well from a symptomatic standpoint.  We will  continue with his present medications.  We will not advance his Coreg as he  has had problems with dizziness in the past when we have attempted this.  We  will check a CMET today as well as TSH and lipids.  I have asked him to  follow up with Dr. Talmage Nap concerning his history of hyperthyroidism.  He does  not need a chest x-ray as he had one in April.  He will see [place] back in nine  months.                              [name] [name], MD, [facility]  BSC/MedQ  DD:  11/18/2005  DT:  11/18/2005  Job #:  [id number]

## 2010-09-17 NOTE — Consult Note (Signed)
NAME:  Michael Krueger, KRISKO                     ACCOUNT NO.:  [id number]   MEDICAL RECORD NO.:  [id number]                   PATIENT TYPE:  INP   LOCATION:  2034                                 FACILITY:  [facility]   PHYSICIAN:  Doylene Canning. Ladona Ridgel, M.D.               DATE OF BIRTH:  [birth year]   DATE OF CONSULTATION:  10/16/2002  DATE OF DISCHARGE:                                   CONSULTATION   REFERRING PHYSICIAN:  Vida Roller, M.D.   REASON FOR CONSULTATION:  Evaluation of ICD discharges.   HISTORY OF PRESENT ILLNESS:  The patient is a very pleasant 55 year old man  followed by Dr. Duke Salvia with an ischemic cardiomyopathy and VT with  an ejection fraction of 27%.  He has class I to II congestive heart failure  symptoms.  The patient has symptomatic bradycardia at baseline.  He had ICD  upgrade to a dual chamber device in the past.  Approximately one and a half  weeks ago, the patient was admitted briefly in the hospital after his ICD  fired.  Cardiac enzymes were negative and he was discharged home.  He felt  well until last night when he was awakened from sleep after an ICD shock.  He was seen in the office today and was found to have had an episode of  sustained monomorphic VT at a rate of approximately 125 to 30 beats per  minute.  Multiple antitachycardic pacing therapies were delivered which did  not terminate his VT.  He received an ICD shock which, in fact, terminated  his VT.  He was sent home from the office but because of increasing anxiety  and fatigue and shortness of breath, he was admitted for additional  evaluation.  His amiodarone dose has transiently been increased up to 600 mg  a day.  The patient denies chest pain.  He denies dyspnea.   PAST MEDICAL HISTORY:  This is as previously noted.  In addition, there is a  history of paroxysmal atrial fibrillation.  He also has a history of  Hodgkin's disease and a history of sick sinus syndrome status post atrial  lead upgrade to his defibrillator.   PAST SURGICAL HISTORY:  The patient is status post splenectomy.  He is  status post multiple coronary artery stentings and has apparently an  occluded RCA by catheterization most recently in October of 2002.   SOCIAL HISTORY:  The patient lives with his girlfriend in [place], [place].  He quit smoking in 2002.  He denies drug use or alcohol use.  He  is disabled.   FAMILY HISTORY:  Both parents are alive.  His father has emphysema.   REVIEW OF SYSTEMS:  No fevers, chills, or night sweats.  He denies any  headache, vision or hearing problems.  He denies any rash or skin lesions.  He does have shortness of breath and dyspnea on exertion.  He denies  peripheral edema, syncope or chronic cough.  He denies arthralgias and  arthritis.  He denies any joint pains.  He denies dysuria, hematuria or  nocturia.  He denies weakness or numbness.  He does have some problems with  depression and anxiety.  He denies nausea, vomiting, diarrhea, or  constipation.  He denies polyuria or polydipsia.  He denies any skin or cold  intolerance.  The rest of his review of systems is negative.   PHYSICAL EXAMINATION:  GENERAL APPEARANCE:  He is a very pleasant middle-  aged man in no distress.  VITAL SIGNS:  His blood pressure was 96/53, pulse 60 and regular,  respiratory rate 18, temperature 98.3.  HEENT:  Normocephalic and atraumatic.  Pupils equal and round.  Oropharynx  moist.  Sclerae are anicteric.  NECK:  No jugular venous distension.  No thyromegaly.  LUNGS:  Clear to auscultation bilaterally.  There are no wheezing, rhonchi  or rales.  CARDIOVASCULAR:  Regular rate and rhythm with normal S1 and S2.  I did not  appreciate any murmurs, rubs, or gallops.  ABDOMEN:  Soft, nontender and nondistended.  There was no organomegaly.  EXTREMITIES:  No clubbing, cyanosis, or edema.  Pulses are 2+ and symmetric.   EKG demonstrates AV sequential pacing.    IMPRESSION:  1. Ischemic cardiomyopathy.  2. Ventricular tachycardia status post recurrent implantable cardioverter     defibrillator shocks.  3. Congestive heart failure presently class II.  4. Dyslipidemia.  5. History of Hodgkin's disease.   DISCUSSION:  As noted earlier, will plan on increasing the patient's  amiodarone to 800 mg to take in two divided doses. He is set up for a  Cardiolite stress test.  Would consider catheterization if this is abnormal.  If the patient continues to have recurrent episodes of ventricular  tachycardia and is refractory to medical therapy, then consideration for VT  ablation would also be an option.                                               Doylene Canning. Ladona Ridgel, M.D.    GWT/MEDQ  D:  10/16/2002  T:  10/17/2002  Job:  [id number]   cc:   Dr. Alvera Singh, M.D.   Duke Salvia, M.D.

## 2010-09-17 NOTE — Discharge Summary (Signed)
[place]  Patient:    Michael Krueger, Michael Krueger Visit Number: [id number] MRN: [id number]          Service Type: CAT Location: 3700 [id number] Attending Physician:  Nathen May Dictated by:   Delton See, P.A. Adm. Date:  12/22/2000 Disc. Date: 12/23/2000   CC:         Dr. Cassandria Santee in [facility]   Referring Physician Discharge [place]  DATE OF BIRTH:  [birth year]  BRIEF HISTORY:  Mr. Vangilder is an unfortunate 55 year old male who has a history of coronary artery disease.  He was status post MI and PCI of the LAD in the past.  He has an ejection fraction of only 20%.  He had an AICD placed at some point for ventricular tachycardia.  He is currently on amiodarone.  He is on Coumadin, I suspect, for low ejection fraction.  He has a history of non-Hodgkins lymphoma and a history of tobacco use.  He was admitted to [facility] on December 22, 2000 for an atrial lead upgrade in his defibrillator.  PAST MEDICAL HISTORY:  Please see above.  He also has a history of hyperlipidemia.  ALLERGIES:  No known drug allergies.  HOSPITAL COURSE:  As noted, this patient was admitted to [facility] on December 22, 2000 for an atrial lead update on his AICD.  This was performed on December 22, 2000 without complication.  Arrangements were made to discharge the patient the following day - December 23, 2000 - in improved condition.  LABORATORY DATA:  A PTT was 29 and INR was 1.2.  Magnesium 2.1.  Troponin and CKs were negative x 1.  An INR on the day of discharge was 2.7.  A digoxin level was 0.7.  ACTIVITY:  As tolerated with limited use of his arm until seen back in follow-up.  DIET:  Low salt/low fat diet.  FOLLOW-UP:  The patient was told to follow up with Dr. [name] as scheduled. He would also follow up for a wound check on September 9 at 12 noon.  He would see Dr. Cassandria Santee as needed or as scheduled.  He was also told to call the  [facility] this week for a follow-up protime either on Thursday or Friday.  DISCHARGE MEDICATIONS: 1. Coumadin - his Coumadin dose was held on the day of discharge as his INR    was 2.6.  He was told to resume his normal dose staring tomorrow. 2. Amiodarone 200 mg daily. 3. Altace 2.5 mg b.i.d. 4. Coreg 3.125 mg twice daily. 5. Lanoxin 0.125 mg daily. 6. Folic acid daily. 7. Niacin 1000 mg daily. 8. Spironolactone 25 mg daily. 9. Lipitor 10 mg daily.  PROBLEM LIST AT TIME OF DISCHARGE: 1. Status post atrial lead upgrade performed August 23. 2. History of coronary artery disease with previous percutaneous intervention    of the left anterior descending artery. 3. Severe left ventricular dysfunction, ejection fraction 20%. 4. Status post automatic implantable cardioverter-defibrillator secondary to    ventricular tachycardia. 5. History of amiodarone therapy. 6. History of Coumadin therapy. 7. History of non-Hodgkins lymphoma. 8. Tobacco history. 9. History of elevated lipids. Dictated by:   Delton See, P.A. Attending Physician:  Nathen May DD:  12/23/00 TD:  12/23/00 Job: [id number]

## 2010-09-17 NOTE — Discharge Summary (Signed)
NAME:  Michael Krueger, Michael Krueger                     ACCOUNT NO.:  [id number]   MEDICAL RECORD NO.:  [id number]                   PATIENT TYPE:  INP   LOCATION:  2034                                 FACILITY:  [facility]   PHYSICIAN:  Vida Roller, M.D.                DATE OF BIRTH:  1956-02-09   DATE OF ADMISSION:  10/16/2002  DATE OF DISCHARGE:  10/18/2002                           DISCHARGE SUMMARY - REFERRING   PROCEDURES:  Adenosine Cardiolite.   HOSPITAL COURSE:  Michael Krueger is a 55 year old male with a history of  ischemic cardiomyopathy.  He also has an ICD and had a recent ICD discharge.  Adjustments were made to his defibrillator, but when he had another ICD  discharge it was found to be appropriate.  He was admitted for overnight  observation and possible adjustment to his defibrillator.  He also  complained of some general malaise and was mildly hypotensive with a  systolic blood pressure in the 90s.   On October 17, 2002 his enzymes had been negative for MI and his systolic blood  pressure was generally between 100 and 110.  He had an adenosine Cardiolite  performed, which showed no ischemia but EF significantly decreased at 15%  and multiple wall motion abnormalities.   On October 18, 2002 he had stabilized with a systolic blood pressure generally  greater than 105 and his oxygen saturation was 97% on room air.  His  respirations were considered at baseline and his symptoms had improved.  He  was considered stable for discharge on October 18, 2002 and is to follow up  with Dr. [name] in July.   LABORATORY VALUES:  Hemoglobin 13.8.  Hematocrit 40.2.  WBC 10.2.  Platelets  185,000.  INR 2.4.  Sodium 138.  Potassium 4.1.  Chloride 106.  BUN 15.  Creatinine 1.1.  Glucose 134.  Serial CK-MB and troponin I negative for MI.   DISCHARGE CONDITION:  Stable.   DISCHARGE DIAGNOSIS:  1. General malaise, improving.  2. Recurrent ventricular tachycardia, ICD fired, amiodarone increased.  3. Ischemic cardiomyopathy with an ejection fraction of 15% by Cardiolite     this admission.  4. Paroxysmal atrial fibrillation.  5. History of congestive heart failure.  6. Status post Medtronic AICD permanent pacemaker, secondary to ventricular     tachycardia, sick sinus syndrome.  7. Status post stents to the left anterior descending artery, circumflex,     and right coronary artery.  8. Chronic anticoagulation.  9. History of Hodgkin's disease.  10.      Hyperlipidemia.  11.      Status post splenectomy.  12.      Remote history of tobacco abuse.   DISCHARGE INSTRUCTIONS:  1. His activity level is to be as tolerated.  2. He is to stick a low-salt, low-fat, and low-cholesterol diet.  3. He is to get an INR Monday.  4. He is to keep his appointment with Dr.  Graciela Husbands and to keep his appointment     with Dr. [name].   DISCHARGE MEDICATIONS:  1. Folic acid 1 mg q. day.  2. Coumadin 2.5 mg one-half tablet q. day except for one tablet on     Monday/Wednesday/Friday.  3. Aspirin 81 mg q. day.  4. Aldactone 25 mg q. day.  5. Digoxin 0.125 mg q. day.  6. Niaspan 1000 mg q.h.s.  7. Coreg 12.5 mg b.i.d.  8. Amiodarone 2 tablets q. day for a month and then 1 tablet q. day.  9. Altace 5 mg b.i.d.  10.      Lipitor 40 mg q.h.s.  11.      Lasix 20 mg q. day.  12.      Nitroglycerin p.r.n.     Michael Krueger, P.A. [facility]                  Vida Roller, M.D.    RG/MEDQ  D:  11/10/2002  T:  11/11/2002  Job:  [id number]   cc:   Olga Millers, M.D.   Duke Salvia, M.D.   Dr. Kenard Gower    cc:   Olga Millers, M.D.   Duke Salvia, M.D.   Dr. Kenard Gower

## 2010-09-17 NOTE — Discharge Summary (Signed)
[place]  Patient:    Michael Krueger, Michael Krueger Visit Number: [id number] MRN: [id number]          Service Type: MED Location: [phone] Attending Physician:  Veneda Melter Dictated by:   Pennelope Bracken, N.P. Admit Date:  02/22/2001 Disc. Date: 02/28/01   CC:         Dr. Cassandria Santee                           Discharge Summary  DATE OF BIRTH:  25-Jul-1955  DISCHARGE DIAGNOSES:  1. Presyncope secondary to ventricular tachycardia in this patient with     history of ventricular tachycardia, status post AICD with recent atrial     lead upgrade, August 2002.  2. Coronary artery disease, history of myocardial infarction x 2, status post     stents of the circumflex, left anterior descending with residual disease     in the OM1, OM2, and the right coronary artery.  3. Amiodarone therapy for #1.  4. Coumadin therapy.  5. Hyperlipidemia.  6. History of non-Hodgkin lymphoma.  7. History of right bundle branch block.  PROCEDURES PERFORMED THIS ADMISSION:  1. Cardiac catheterization October 29 performed by Daisey Must, M.D.     showing normal left main, instent stenosis of the LAD of 30%, diagonal 2     with 30% stenosis, left circumflex has 40% instent restenosis, with     disease of the OM1 and OM2 ranging between 50 and 90 creating 50 to 90%     occlusions, and a RCA with chronic occlusive disease unchanged since last     catheterization. Estimated ejection fraction of 27% with trace MR.  2. Adenosine Cardiolite performed October 26 revealing anterolateral     ischemia.  3. Stent placements in LAD and circumflex with residual disease remaining in     the RCA.  HISTORY OF PRESENT ILLNESS:  This delightful, 55 year old white male has a long history of coronary artery disease with an ejection fraction that has been calculated to be below 20% and HSRA to the OM. He had had a recent atrial lead upgrade in August of this year. After which, he had  experienced multiple defibrillator fires. These firings were preceded by prodrome of dizziness. On the day of admission, the patient had experienced the same type of dizziness and expected his AICD to fire, but it did not. He was experiencing some chest palpitations at the same time and a general feeling of malaise. He summoned EMS; and by history, they describe a heart rate in the 140 range. This, unfortunately, was not recorded. By the time he was brought to [facility], his symptoms had improved without intervention. He was admitted for further evaluation and treatment.  HOSPITAL COURSE:  At admission, Michael Krueger EKG revealed a rate of 64, A-V paced rhythm. Cardiac enzymes were within normal limits with a CK of 211, MB of 1.6, and a troponin I of 0.1. TSH was 1.7. Chest x-ray was negative for disease. His ICD was interrogated on day of admission revealing the following: Episode of ventricular tachycardia which was ATP terminated; this appeared to have been initiated below the VT detect rate and six total nonsustained episodes, four of which occurred within the last seven days. The patient was maintained on his amiodarone and Coumadin therapy throughout his admission and was generally free of dizziness, chest pain, and palpitations throughout. An adenosine Cardiolite conducted on  day two revealed anteroseptal and inferior scar with moderate anterolateral ischemia. Ejection fraction was estimated at 19%. The patient was taken to catheterization on October 29, and findings were as above. The patient tolerated the procedure well and had no immediate complications. On the morning of discharge, he was found to be in stable condition. He admitted no chest pain, shortness of breath, or palpitations. Vital signs were as follows:  Blood pressure 90/50, pulse 60, afebrile, and pulse oximetry 95% on room air. Chest was clear to auscultation. Heart displayed a regular rate and rhythm. Right groin  site was without hematoma or bruit, and extremities were without clubbing, cyanosis, or edema. The patients INR was 1.4 on day of discharge, PT 16, TSH was 2.98, total bilirubin was 1.2, and direct bilirubin 0.2. The patient was deemed suitable for discharge by [name]. Michael Krueger, M.D.  DISCHARGE MEDICATIONS:  1. Amiodarone 400 mg b.i.d. x 2 weeks, q.d. x 2 weeks, then 200 mg q.d.     Prescriptions are given for this, and the patient states understanding of     dosing.  2. Altace 2.5 mg one b.i.d.  3. Coreg 3.125 one b.i.d. The patient is agreeable with splitting this dose     up to avoid the dizziness that a single 6.25 mg dose has caused him in the     past. A new prescription is given for the 3.125 mg dose.  4. Lanoxin 0.125 mg one q.d.  5. Aldactone 25 mg one q.d.  6. Lipitor 10 mg one q.h.s.  7. Folic acid 1 mg q.d.  8. Aspirin 325 mg one q.d.  9. Niacin 500 mg two q.h.s. 10. Coumadin dose remains unchanged. The patient will take 2.5 mg tablet on     Monday and then 1/2 tab for the rest of the week.  INSTRUCTIONS:  No driving, heavy lifting, or exertion for two days. No tub baths. The patient agrees to call if groin wound becomes hard or painful.  DIET:  Low-salt, low-fat, low-cholesterol diet.  FOLLOW-UP:  He will have his INR checked on Monday, November 4 at 3:45 and will follow up with [name]. Michael Krueger, M.D. December 12 at 10 a.m. He knows to call the office in the interim with any problems, questions, or concerns or achange or increase in his symptoms. Dictated by:   Pennelope Bracken, N.P. Attending Physician:  Veneda Melter DD:  02/28/01 TD:  02/28/01 Job: [id number]

## 2010-09-17 NOTE — Op Note (Signed)
NAME:  Michael Krueger, Michael Krueger                     ACCOUNT NO.:  [id number]   MEDICAL RECORD NO.:  [id number]                   PATIENT TYPE:  OIB   LOCATION:  2858                                 FACILITY:  [facility]   PHYSICIAN:  Duke Salvia, M.D.               DATE OF BIRTH:  Aug 28, 1955   DATE OF PROCEDURE:  DATE OF DISCHARGE:  10/28/2003                                 OPERATIVE REPORT   DATE OF PROCEDURE:  October 28, 2003.   PREOPERATIVE DIAGNOSIS:  Previously implanted defibrillator for chronotropic  incompetence, ventricular tachycardia, now at end-of-life.   POSTOPERATIVE DIAGNOSIS:  Previously implanted defibrillator for  chronotropic incompetence, ventricular tachycardia, now at end-of-life.   PROCEDURE:  Explantation of a previously implanted device with  intraoperative defibrillation threshold testing.   Following obtaining informed consent, the patient was brought to the  electrophysiology laboratory and placed on the fluoroscopic table in the  supine position.  After routine prep and drape of the left upper chest,  lidocaine was infiltrated along the line of the previous incision and  carried down to the layer of the defibrillator pocket using sharp dissection  and electrocautery.  A pocket was formed similarly,  hemostasis was  obtained.   The defibrillator pocket was gently opened up and the leads freed up and the  previously implanted Medtronic Gem GR pulse generator was explanted.  The  leads were interrogated.  The bipolar P-wave was 3.6 with an impedance of  578, a threshold of 0.7 volts at 0.5 msec with a currented threshold of  1.3MA.  The R-wave was 5 mV with a pacing impedance of 436 ohms and a pacing  threshold of 1.2 volts at 0.5 msec, currented threshold is 3.8 MA.   With these acceptable parameters recorded, the lead was then attached to a  Medtronic Intrinsic 7288 dual-chamber ICD, serial number [id number].  Through this device, the bipolar P-wave was 2.6  mV with a pacing impedance  of 520 ohms and the patient's threshold at 1 volt at 0.5 msec, and the R-  wave is 3.8 mV with a pacing impedance of 332 ohms and the patient's  threshold of 1.5 volts at 0.3 msec.  The high voltage impedance was 46 ohms.  The proximal coil impedance was 58 ohms.   With these acceptable parameters recorded, defibrillation threshold testing  was undertaken.  Ventricular fibrillation was induced via the T-wave shock.  At a sensitivity of 1.2 mV after a total duration of 7.5 seconds, a 25-dual  shock was delivered through a measured resistance of 45 ohms, terminating  ventricular fibrillation and restored to sinus rhythm.  At this point, the  device was implanted.  The pocket was copiously irrigated with antibiotic  containing saline solution.  Hemostasis was assured, and the leads in the  pulse generator were then placed in the pocket.  The wound was closed in 3  layers in the normal fashion.  The wound  was washed, dried, and a  Benzoin/Steri-Strip  dressing was applied.  Needle counts, sponge counts, and instrument counts  were correct at the end of the procedure according to the staff.   The patient tolerated the procedure without apparent complication.                                               Duke Salvia, M.D.    SCK/MEDQ  D:  10/28/2003  T:  10/28/2003  Job:  [id number]   cc:   Electrophysiology Laboratory   [facility]

## 2010-09-17 NOTE — Cardiovascular Report (Signed)
[place]. [facility]  Patient:    Michael Krueger, Michael Krueger Visit Number: [id number] MRN: [id number]          Service Type: MED Location: [phone] Attending Physician:  Veneda Melter Dictated by:   Daisey Must, M.D. [facility] Proc. Date: 02/27/01 Admit Date:  02/22/2001   CC:         [name]. [name], M.D. [facility]             Cardiac Catheterization Laboratory                        Cardiac Catheterization  PROCEDURES PERFORMED: Left heart catheterization with coronary angiography and left ventriculography.  INDICATIONS: The patient is a 55 year old male with a history of ischemic cardiomyopathy. He has had previous stent placements in the LAD, left circumflex and the right coronary artery. On his last catheterization his right coronary artery was chronically occluded at the previous stent placement site. At that time he had an acute closure of his LAD causing anterior infarct which was treated with PTCA and stent placement in the mid LAD by Dr. Riley Kill. He represented to the hospital this time with recurrent ventricular tachycardia. A Cardiolite scan showed evidence of extensive anteroseptal and inferior scar with some anterolateral ischemia. He was therefore referred for cardiac catheterization.  DESCRIPTION OF PROCEDURE: A 6 French sheath was placed in the right femoral artery. Standard Judkins 6 French catheters were utilized. Contrast was Omnipaque. There were no complications.  RESULTS:  HEMODYNAMICS: Left ventricular pressure 116/26. Aortic pressure 116/74.  There was no aortic valve gradient.  LEFT VENTRICULOGRAM: There is moderate akinesis of the anterior wall, dyskinesis of the apex and severe akinesis of the inferior wall. Ejection fraction is calculated at 27%. There is trace mitral regurgitation.  CORONARY ARTERIOGRAPHY: (Right dominant).  Left main: Normal.  Left anterior descending: The left anterior descending artery has a stent  in the mid LAD which is patent with a 20% stenosis within the stent. The distal LAD has a diffuse 30% stenosis. The LAD gives rise to two normal sized diagonal branches. The second diagonal has a 30% stenosis at its origin.  Left circumflex: The left circumflex has a stent in the mid vessel which is widely patent with less than 10% stenosis within the stent.  The distal circumflex has a diffuse 50% stenosis extending into the proximal portion of the second obtuse marginal.  The circumflex gives rise to a first obtuse marginal branch which has a 90% stenosis at its origin. There is a large second obtuse marginal with a diffuse 50% stenosis proximally as described above.  Right coronary artery: The right coronary artery is a dominant vessel. In the proximal vessel is a diffuse 70% stenosis followed by a 90% stenosis in the mid vessel. Following this, there is a 100% occlusion in the mid vessel with right to right and left to right collaterals filling the distal right coronary artery.  IMPRESSIONS: 1. Severely decreased left ventricular systolic function secondary to    ischemic cardiomyopathy. 2. Patent stents in the left anterior descending and left circumflex    as described with moderate residual disease in the distal left circumflex    which does not appear to be flow-limiting. 3. 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  In review, the patients anatomy appears relatively stable compared to previous catheterization.  PLAN: Medical therapy. Dictated by:   Daisey Must, M.D. [facility] Attending Physician:  Chales Abrahams,  Higinio Plan DD:  02/27/01 TD:  02/28/01 Job: [id number]

## 2010-09-17 NOTE — Discharge Summary (Signed)
. [facility]  Patient:    Michael Krueger, Michael Krueger                  MRN: [id number] Adm. Date:  [id number] Disc. Date: 01/13/00 Attending:  Junious Silk Dictator:   Tereso Newcomer, P.A.-C.                           Discharge Summary  DATE OF BIRTH:  10-18-1955  CHIEF COMPLAINT:  Acute anterolateral myocardial infarction.  DISCHARGE DIAGNOSES:  1. Status post acute anterolateral myocardial infarction.  2. Severe left ventricular dysfunction, ejection fraction estimated to     be in the 20% range.  3. Ischemic cardiomyopathy.  4. Status post implantable cardiac defibrillator during a previous admission.  5. Hyperlipidemia.  6. Tobacco abuse.  7. History of non-Hodgkins lymphoma.  8. Status post splenectomy.  9. Coumadin therapy initiated this admission. 10. Amiodarone therapy initiated this admission. 11. Right bundle branch block, old. 12. Ventricular tachycardia this admission, status post implantable     cardiac defibrillator fire by report. 13. Sinusitis this admission, treated with azithromycin.  PROCEDURE: 1. Cardiac catheterization on January 03, 2000, by Dr. Arturo Morton. Stuckey.    Following results:  Left main free of critical disease, LAD totally    occluded beyond the origin of the major diagonal.  After reperfusion    this area was reduced to less than 10%.  Distally there was a long area    of diffuse 60%-70% narrowing, but TIMI-3 flow.  Circumflex with previous    stent site demonstrating 30% narrowing.  Tiny marginal branch with 70%    ostial narrowing.  Large marginal branch previously rotoblated with a    70% area of narrowing, followed by a 50% area of narrowing slightly more    distally.  The RCA diffusely diseased in the proximal portion, and    totally occluded.  The distal vessel filled by left to right collaterals.    Ventriculography with severe reduction in global LV function.  Ejection    fraction estimated  at 20%.  Anterolateral apical segment akinetic. 2. Status post PTCA and stenting of the LAD. 3. Intra-aortic balloon pump post-catheterization.  HISTORY OF PRESENT ILLNESS:  This 55 year old male with a known history of coronary artery disease and ischemic cardiomyopathy, status post ICD and a history of non-Hodgkins lymphoma, was seen in the emergency room on January 03, 2000, for complaints of chest pain.  The patient had done well since he was seen in the office two weeks prior.  He developed right-sided chest pain about 12 p.m. on the date of admission.  He described the pain as beginning "pushing out."  He denied shortness of breath, nausea, vomiting, or diaphoresis.  The pain did radiate to his neck.  He noted some nonpleuritic symptoms.  He noted that these symptoms were unlike his pain with myocardial infarction.  PHYSICAL EXAMINATION:  GENERAL:  A well-developed, well-nourished male, in no acute distress.  VITAL SIGNS:  Blood pressure 102/60, pulse 58.  NECK:  Without bruits or jugular venous distention.  CHEST:  Clear to auscultation with normal expansion.  CARDIAC:  A regular rate and rhythm with no murmurs, rubs, or gallops.  ABDOMEN:  Nontender.  EXTREMITIES:  Without edema.  LABORATORY DATA:  Sodium 141, potassium 3.8, chloride 108, BUN 12, creatinine 0.8, glucose 104.  Hemoglobin 13, hematocrit 38.  Electrocardiogram:  Normal sinus rhythm, right bundle branch  block, old septal myocardial infarction.  ST elevations about 1 mm in V3 through V6.  Chest x-ray is read by the radiologist as a stable chest.  No active findings.  HOSPITAL COURSE:  The patient was admitted for probable acute lateral myocardial infarction.  He was sent emergently to the cardiac catheterization laboratory.  The results are noted above.  Post-procedure he returned to the cardiac intensive care unit in stable condition, with intra-aortic balloon pump in place.  The intra-aortic balloon  pump was later removed in the afternoon on January 04, 2000.  The patient remained on heparin.  He was considered for the PAMI LV thrombus study; however, the patient decided to decline due to the inability to give himself Lovenox injections; therefore Coumadin was begun on January 06, 2000.  The patient received Coumadin 5 mg q.d. x 2 days.  Then on January 08, 2000, he received another 5 mg.  He received 1 mg on January 09, 2000.  He received 2 mg on January 10, 2000. His Coumadin was held on January 11, 2000.  He was given 5 mg on January 12, 2000.  His INRs during that time were as follows:  On the 3rd it was 1.1, on the 7th it was 1.3, on the 8th it was 1.8, on the 9th it was 3.4, on the 10th it was 3.3, on the 11th it was 3.3, on the 12th it was 2.0, and on the 13th it was 2.2.  The patient was placed on Coumadin due to his severe LV dysfunction and anterolateral akinesis.  The patients medications were also adjusted.  His labs revealed a lipid profile with a low HDL at 23, total cholesterol 113, triglycerides 127, LDL 65.  Given his low HDL, Niaspan was added.  On January 05, 2000, Dr. Carolanne Grumbling was called to see the patient for fire of the patients ICD.  The patient had ventricular tachycardia with apparent shock by report from the staff.  The patient was transferred back to the cardiac intensive care unit.  He was started on IV lidocaine.  The lidocaine was discontinued and the patient was begun on amiodarone.  The patients PFTs with DLCO were checked.  The unconfirmed results on the chart were an FVC of 76% of the predicted valve, FEV-I 69% of the predicted value, FEF 25%-75%, 41% of predicted value, FEV-I/FVC 91% predicted value.  Computerized interpretation read as nonobstructive disease, restrictive disease could not be excluded by spirometry alone.  Post-bronchodilator study not performed. The patients LFTs on admission were a total protein of 4.6, albumin 2.5,  AST 130, ALT 32, alkaline phosphatase 52, total bilirubin 0.7, direct bilirubin 0.2, indirect bilirubin 0.5.  The patients TSH was also checked and this was  normal at 1.513.  On January 07, 2000, the patient was noted to have an elevated white blood cell count at 22,000, associated with a fever.  It was felt that this was most likely due to sinusitis, and he was given a course of azithromycin.  Prior to discharge his white blood cell count was somewhat decreased at 15,100.  Dr. Nathen May saw the patient on January 07, 2000, and interrogated his pacemaker.  The patient continued on his amiodarone load and was transferred back to the telemetry unit.  The patient was continued on heparin for 24 hours after the patients INR was therapeutic, due to recent stent placement.  As noted previously in this dictation, the INR was 3.3 on January 11, 2000.  Coumadin  was held that day. His amiodarone was decreased and his heparin was stopped on January 11, 2000.  Also Lanoxin was added.  Coreg and Altace were also added.  DISPOSITION:  On January 13, 2000, the patient was found to be in stable condition for discharge to home.  OTHER LABORATORY DATA:  The patients cardiac enzymes:  Total CPK and CPK-MB peaked at 2105 and MB was 312.6.  His troponin I peaked at 12.89, and his last recorded troponin I was 2.06.  Last recorded cardiac enzymes:  Total CPK 386, CPK-MB 18.8.  The patient had blood cultures checked x 2 and these were without growth x days.  The patient also had a urine culture performed that only grew out 15,000 colony-forming units.  DISCHARGE MEDICATIONS:  1. Amiodarone 200 mg two tablets p.o. q.d.  2. The patient will be given samples from our office of Pacerone 400 mg     one q.d.  3. Coumadin 2.5 mg q.d.  4. Altace 1.25 mg p.o. b.i.d.  The patient will be given samples from     our office of 2.5 mg capsules, and will take one q.d. at bedtime.  If     he does fill  the prescription, he is to continue taking the 1.25 mg     b.i.d.  5. Coreg 3.125 mg p.o. b.i.d.  6. Lipitor 20 mg p.o. q.h.s.  7. Coated aspirin 81 mg q.d.  8. Niaspan 500 mg p.o. q.h.s.  9. Folic acid 1 mg q.d. 10. Aldactone 25 mg p.o. q.d. 11. Lanoxin 0.125 mg p.o. q.d. 12. Plavix 75 mg p.o. q.d.  ACTIVITIES:  As tolerated.  DIET:  Low-fat, low-sodium diet.  WOUND CARE:  The patient should wash his groin.  For any increased swelling, bleeding, or bruising to call our office with concerns.  INSTRUCTIONS:  He is to stop smoking.  He has been notified that his atenolol and Lotensin have been discontinued.  FOLLOWUP:  He is to follow up with Dr. [name]. C[place] on January 25, 2000, at 4:15 p.m.  He is to follow up with the Coumadin Clinic in our office on Monday, January 17, 2000, at 9 a.m. for INR check.  The patient will need follow-up liver function tests, PFTs with DLCO, and TFTs in six to eight weeks.  The patient will also have a Digoxin level checked on Monday, January 17, 2000, when he has his INR checked. DD:  01/13/00 TD:  01/13/00 Job: [id number]

## 2010-09-17 NOTE — H&P (Signed)
NAME:  Michael Krueger, Michael Krueger                     ACCOUNT NO.:  [id number]   MEDICAL RECORD NO.:  [id number]                   PATIENT TYPE:  EMS   LOCATION:  [facility]                                 FACILITY:  [facility]   PHYSICIAN:  Vida Roller, M.D.                DATE OF BIRTH:  Feb 16, 1956   DATE OF ADMISSION:  10/16/2002  DATE OF DISCHARGE:                                HISTORY & PHYSICAL   PRIMARY CARE Azell Bill:  Dr. Phineas Real in [place].   CARDIOLOGIST:  Olga Millers, M.D.   ELECTROPHYSIOLOGIST:  Doylene Canning. Ladona Ridgel, M.D.   REASON FOR ADMISSION:  General malaise.   HISTORY OF PRESENT ILLNESS:  Michael Krueger is a 55 year old gentleman with an  ischemic dilated cardiomyopathy and a long history of ventricular  tachycardia.  He was admitted over the weekend for throat tightness and  shortness of breath associated with his general malaise.  At that time, he  had evidence of ventricular tachycardia on interrogation of his  defibrillator and adjustments were made to his defibrillator.  He continues  to feel poorly.  He says he had another discharge from his defibrillator.  Presented to the office today, was interrogated.  It appears that he had an  appropriate discharge from his defibrillator and now presents to the  emergency department concerned that he is going to have another discharge  and does not want to be by himself when this occurs.   PAST MEDICAL HISTORY:  1. Ischemic dilated cardiomyopathy.  2. He has severe coronary disease with evidence of an in-stent restenosis in     his left anterior descending coronary artery.  He has also minimal     stenosis with 20% in the midportion of the artery, 30% in the distal     portion of the artery.  The second diagonal has a 30% stenosis at its     origin.  The circumflex coronary artery also has a stent in it and it has     mild in-stent restenosis in its proximal portion.  There is a distal 50%     stenosis.  The first obtuse  marginal, which is a moderate-caliber vessel,     has a 90% stenosis of its origin.  The obtuse marginal #2 has a 50%     stenosis and the right coronary artery is occluded with left-to-right     collaterals.  3. He has a history of paroxysmal atrial fibrillation and also has a history     of paroxysmal ventricular tachycardia with an implantable defibrillator     pacer.  4. He also has a history of sick sinus syndrome and he is permanently paced     from that.  5. He has a history of Hodgkin's disease, status post a splenectomy, which     is in remission.  6. He has hyperlipidemia.   ALLERGIES:  He is not allergic to any medications.  PAST SURGICAL HISTORY:  Only the splenectomy.   MEDICATIONS ON ADMISSION:  1. Folic acid 1 mg a day.  2. Coumadin as directed by our Coumadin clinic.  3. Aspirin 81 mg a day.  4. Aldactone 25 mg a day.  5. Lanoxin 0.125 mg a day.  6. Niaspan 1 g a day.  7. Coreg 12.5 mg twice a day.  8. Amiodarone 200 mg daily.  9. Altace 5 mg twice a day.  10.      Lipitor 40 mg at night.  11.      Lasix 20 mg daily.   SOCIAL HISTORY:  He lives with his girlfriend in [place].  He quit  smoking years ago.  He is disabled secondary to his coronary artery disease.  He does not drink alcohol.  He does not use any illicit drugs.   FAMILY HISTORY:  Noncontributory.   REVIEW OF SYSTEMS:  Noncontributory except that specified in the history of  present illness.   PHYSICAL EXAMINATION:  VITAL SIGNS:  His blood pressure is 96/53.  His heart  rate is 59 and paced.  His respiratory rate is 18 and he is saturating at  97% on room air.  He is afebrile.  HEENT:  Unremarkable.  NECK:  Supple.  There is no jugular venous distention or carotid bruits  noted.  CHEST:  Clear to auscultation.  HEART:  A nondisplaced point of maximal impulse with no lifts or thrills.  First and second heart sounds are normal.  There is no third or fourth heart  sound and no murmurs are  noted.  His defibrillator pocket is normal sized  and well healed with no evidence of erythema.  ABDOMEN:  Soft, nontender, normoactive bowel sounds.  SKIN:  Without rashes.  EXTREMITIES:  2+ pulses throughout.  There is an easily audible left femoral  bruit.  There is none on the right.  NEUROLOGIC:  Nonfocal.   LABORATORY DATA:  His chest x-ray and laboratories are pending.  There is an  I-STAT available which shows a glucose of 134, a BUN of 15, sodium of 138,  potassium of 4.1, chloride of 106, bicarbonate of 21.  Hemoglobin of 43,  hematocrit of 15.  His venous pH is 7.42.   His electrocardiogram shows a ventricularly-paced AV pacing and in what  appears a dual-chamber mode.   ASSESSMENT:  This is a gentleman with a recurrent ventricular tachycardia  requiring defibrillator discharge after being reprogrammed less than a week  ago.  He obviously has some form of ischemic-mediated ventricular  tachycardia and consideration should be made for an assessment for his  ischemia.  I know he has recently had a stress test in the office which did  not show any evidence of ischemia; however, it might be reasonable to repeat  this.  We will get Dr. Ladona Ridgel or Dr. Graciela Husbands to see him later on today to  assess whether further evaluation for his ventricular tachycardia needs to  be done.  With regard to his ischemic cardiomyopathy, he is on an excellent  medical regimen and I would not change that therapy currently.                                               Vida Roller, M.D.    JH/MEDQ  D:  10/16/2002  T:  10/16/2002  Job:  [id number]  

## 2010-09-17 NOTE — Discharge Summary (Signed)
Michael Krueger, Michael Krueger           ACCOUNT NO.:  [id number]   MEDICAL RECORD NO.:  [id number]          PATIENT TYPE:  INP   LOCATION:  3711                         FACILITY:  [facility]   PHYSICIAN:  Willa Rough, M.D.     DATE OF BIRTH:  [birth year]   DATE OF ADMISSION:  01/16/2004  DATE OF DISCHARGE:  01/21/2004                                 DISCHARGE SUMMARY   DISCHARGE DIAGNOSES:  1.  Admitted with implantable cardioverter-defibrillator discharges      (antitachycardia pacing), appropriately for recurrent ventricular      tachycardia.  2.  Ruled out for myocardial infarction by cardiac enzymes.  3.  The patient had discontinued amiodarone for thyrotoxicosis.  4.  Euthyroid by studies here at [facility].  5.  Status post 2-D echocardiogram, January 19, 2004.  6.  Status post left heart catheterization, January 20, 2004.   PLAN:  1.  Plan discharging on Coumadin with Lovenox bridge.  2.  Will follow up with Coumadin Clinic, Friday, January 22, 2004.  3.  Dr. Salvatore Decent Klein's office will call regarding ablation procedure; the      call will be made within 1 week.   SECONDARY DIAGNOSES:  1.  Ischemic cardiomyopathy with ejection fraction of 20% to 25% on      echocardiogram, January 19, 2004.  2.  Status post placement of implantable cardioverter-defibrillator.  3.  Class II congestive heart failure symptoms.  4.  Dyslipidemia.  5.  Right bundle branch block with first-degree atrioventricular block.  6.  Low HDL/dyslipidemia.   PROCEDURES:  1.  Two-dimensional echocardiogram, January 19, 2004.  This study shows      ejection fraction of 20% to 25%, severe hypokinesis of anterolateral      wall, akinesis of septal wall and apex, mild mitral annular      calcification.  2.  Left heart catheterization.  The study showed that the left main was      without significant coronary lesions.  The left anterior descending had      diffuse luminal irregularities.  A proximal  stent was patent.  The mid      left anterior descending had a 25% stenosis.  The first and second      diagonals were moderate in size and normal.  The left circumflex had a      proximal stent which was patent.  There was a mid-point long 30%      stenosis.  The large obtuse marginal was patent.  The right coronary      artery had a 100% midpoint stenosis with bridging collaterals.  Left      ventricle not injected.  Impression was severe single-vessel coronary      artery disease with patent left anterior descending and circumflex      stents, continuing medical management.   DISCHARGE DISPOSITION:  [name] is ready for discharge, January 21, 2004.  He has had no recurrent ventricular tachycardias after loading with  amiodarone; this was done by starting oral dose of 400 mg p.o. t.i.d. on  January 17, 2004.  He also was  restarted on his Tapazole 5 mg daily.  He  has been maintained on IV heparin throughout his hospital stay here.  His  ICD was interrogated during this hospitalization.  Thyroid studies were  within normal limits, the TSH was 2.222, T4 was 1.26, T3 was 2.7.   DISCHARGE MEDICATIONS:  [name] goes home with the following  medications:  1.  Aldactone 25 mg daily.  2.  Digoxin 0.125 mg daily.  3.  Altace 5 mg twice daily.  4.  Niacin 1000 mg daily at bedtime.  5.  Lasix 20 mg daily.  6.  Protonix 40 mg daily -- this new.  7.  Tapazole 5 mg daily.  8.  Enteric-coated aspirin 81 mg daily.  9.  Coreg 3.125 mg twice daily -- a new dose.  10. Amiodarone 200 mg tablets 2 tabs in the morning, 2 tabs in the evening      for 2 weeks, then to decrease to 2 tabs daily of amiodarone.  11. His Coumadin dose will be 3.75 mg on Thursday, January 22, 2004, and      then he will report to the Coumadin Clinic, [facility], on      Friday, January 23, 2004.  He will also start Lovenox injections the      evening of January 21, 2004 after discharge; it will be  11 p.m. on      January 21, 2004, then he will start at 7 a.m., then 7 p.m. injections      on January 22, 2004; his last dose prior to the Coumadin Clinic will      be 7 a.m. on January 23, 2004.  He will be given 2 prescriptions for 4      doses each so that if he needs to extend his Lovenox dosing after      Friday, January 23, 2004, he may do that.  Once again, he has an      office visit with the Coumadin Clinic, Friday; they will call him to      arrange that.  12. He is also on Lipitor 20 mg daily at bedtime.   FOLLOWUP:  Dr. Odessa Fleming office will call about an ablation procedure within a  week.   BRIEF HISTORY:  Michael Krueger is a 55 year old male.  He has known ischemic  heart disease.  He is status post anterior wall myocardial infarction and  has had PCI to the LAD and to the circumflex.  He has class II congestive  heart failure symptoms.  He has been admitted with 2 episodes of ICD  discharge.  The patient has a prior history of ventricular tachycardia storm  and more recently, frequent episodes of antitachycardia pacing during  defibrillator interrogation.  He was last seen by Dr. Graciela Husbands in the clinic on  January 14, 2004.  At that time, it was noted the patient had frequent  antitachycardia pacing, but his device was otherwise functioning properly.  He did not report any worsening symptoms of heart failure or substernal  chest pain.  The patient has required amiodarone in the past for VT  suppression.  This therapy has been discontinued about 3 weeks ago due to  evidence of hyperthyroidism by lab tests.  The patient, however, was not  clinically thyrotoxic.  He was seen by Dr. Dorisann Frames and started on  Tapazole.  Shortly thereafter, amiodarone was discontinued.  The patient, at  the presentation, is now euthyroid.  The patient was transferred  from  [place] after he presented with his second ICD shock for the day.  He arrived at [facility] essentially  symptom-free.  He denies  substernal chest pressure or worsening dyspnea.  He has occasional PVCs, but  no evidence of recurrent ventricular tachycardia.  Over the last several  weeks, he has had frequent sensations of palpitation; he attributes this to  antitachycardia pacing.  Yesterday, he also had frequent episodes.   HOSPITAL COURSE:  The patient was admitted, ICD was interrogated, he was  placed on IV heparin, his thyroid studies were obtained, he was restarted on  Tapazole and restarted on amiodarone.  He underwent left heart  catheterization, the study results dictated above.  He was seen by both Dr.  Doylene Canning. Ladona Ridgel and Dr. Graciela Husbands, this admission, and ablation therapy has been  discussed; the patient is willing to proceed; this will be done on an  elective basis after discharge today.       GM/MEDQ  D:  01/21/2004  T:  01/22/2004  Job:  [id number]   cc:   Duke Salvia, M.D.   Olga Millers, M.D. [facility]   Dorisann Frames, M.D.  [phone] [place] [address]  Fax: [phone]

## 2010-09-17 NOTE — Cardiovascular Report (Signed)
Michael Krueger, Michael Krueger           ACCOUNT NO.:  [id number]   MEDICAL RECORD NO.:  [id number]          PATIENT TYPE:  INP   LOCATION:  3711                         FACILITY:  [facility]   PHYSICIAN:  Rollene Rotunda, M.D.   DATE OF BIRTH:  09/01/1955   DATE OF PROCEDURE:  01/20/2004  DATE OF DISCHARGE:                              CARDIAC CATHETERIZATION   PRIMARY CARE PHYSICIAN:  Dorisann Frames, M.D.   PROCEDURE:  Left heart catheterization, coronary arteriography.   INDICATION:  Evaluate patient with cardiomyopathy and known coronary  disease.  He presents with recurrent ventricular tachycardia.   PROCEDURE NOTE:  Left heart catheterization was performed via the right  femoral artery.  The artery was cannulated using an anterior wall puncture.  A 6 French arterial sheath was inserted via the modified Seldinger  technique.  Preformed Judkins and a pigtail catheter were utilized.  The  patient tolerated the procedure well and left the lab in stable condition.   RESULTS:   HEMODYNAMICS:  1.  LV 95/18.  2.  Aortic 102/76.   CORONARIES:  The left main was normal.  The LAD had diffuse luminal  irregularities.  There was a patent proximal stent.  There was mid 25%  stenosis.  First diagonal was moderate size and normal.  A second diagonal  was moderate size and normal.  The circumflex had a proximal stent which was  widely patent.  There was mid long 30% stenosis in the AV groove.  There was  a large OM-1 which was patent.  The right coronary artery was occluded in  the mid segment with bridging collaterals.   LEFT VENTRICULOGRAM:  Left ventricle was not injected, but was crossed for  pressures.   CONCLUSION:  1.  Severe single-vessel coronary artery disease with total occlusion of his      right coronary.  2.  Patent circumflex and left anterior descending stents.   PLAN:  Medical management and continued electrophysiology evaluation.       JH/MEDQ  D:  01/20/2004  T:   01/20/2004  Job:  [id number]   cc:   Dorisann Frames, M.D.  [address] [place] [address]  Fax: [phone]

## 2010-09-17 NOTE — Procedures (Signed)
[place]. [facility]  Patient:    Michael Krueger, Michael Krueger Visit Number: [id number] MRN: [id number]          Service Type: CAT Location: 3700 [id number] Attending Physician:  Nathen May Dictated by:   Nathen May, M.D., [facility] Proc. Date: 12/22/00 Adm. Date:  12/22/2000 Disc. Date: 12/23/2000   CC:         Electrophysiology Laboratory  [place] [facility], Attn: [facility]   Procedure Report  PREOPERATIVE DIAGNOSIS:  Chronotropic incompetence with ventricular tachycardia and previous single chamber ICD implantation.  POSTOPERATIVE DIAGNOSIS:  Chronotropic incompetence with ventricular tachycardia and previous single chamber ICD implantation.  PROCEDURE PERFORMED: Insertion of an atrial lead and intraoperative defibrillation threshold testing.  DESCRIPTION OF PROCEDURE:  Following the obtainment of informed consent, the patient was brought to the electrophysiology laboratory and placed on the fluoroscopic table in the supine position.  After routine prep and drape of the left upper chest, intravenous contrast was injected via the left antecubital vein to identify the course and the patency of the extra thoracic left subclavian vein.  This having been accomplished, Lidocaine was infiltrated in the prepectorial subclavicular region.  An incision was made and carried down to the layer of the defibrillator pocket with sharp dissection and electrocautery.  The pocket was opened and the device was freed up from its heavy scar tissue and ultimately removed.  The lead was inspected without evidence of fracture.  The lead was interrogated and was found to have an R wave of 5.5 mV with a pacing impendance of 401 and a pacing  threshold of 1.2v at 0.5 msec with a current threshold of 3.61ma.  At this juncture, attention was turned to gain access to the extra thoracic left subclavian vein which was accomplished without significant  difficulty. There was significant scar tissue and ultimately a 7 [place] hemostatic tear away sheath was placed through which was passed a Medtronics capture fix 5076 52 cm active fixation atrial lead, serial number [id number].  There was noted to be stenosis at the innominate SEC junction but this was successfully negotiated.  The lead was then placed in the right atrial appendage where the bipolar P wave was 1.7 mV with a pacing impendance of 687 and pacing threshold immediately after lead deployment of 1.2v and 0.5 msec with a current threshold of 2.48ma.  With these acceptable parameters recorded, the leads were then attached to the originally implanted 7271 ICD serial number [id number].  Through the devise the bipolar R wave was 5 mV with pacing impendance of 298 and pacing threshold of 2v at 0.2 msec all consistent with prior measurements.  The high voltage impendance was 13 ohms.  The atrial lead interrogation demonstrated P wave of 2 mV with pacing impendance of 521 and pacing threshold of 2v at 0.5 msec.  With these acceptable parameters recorded, defibrillation threshold testing was again undertaken.  Ventricular fibrillation was induced using T wave shock.  After a total duration of 9 seconds a 25 joule shock was delivered through a measured distance of 43 ohms, terminated ventricular fibrillation restoring sinus rhythm.  With these acceptable parameters recorded, the system was implanted. The pocket was copiously irrigated with antibiotic containing saline solution. The leads and the pulse generator were placed in the pocket with care given to the leads being loosely wrapped.  The wound was closed in three layers in the normal fashion.  The wound was washed dry and a Benzoin Steri-Strips dressing was then  applied.  Needle counts, sponge counts and instrument counts were correct at the end of the procedure according to the staff. Dictated by:   Nathen May, M.D., [facility]  [facility] Attending Physician:  Nathen May DD:  12/22/00 TD:  12/25/00 Job: [id number]

## 2010-09-17 NOTE — Op Note (Signed)
Michael Krueger, Michael Krueger           ACCOUNT NO.:  [id number]   MEDICAL RECORD NO.:  [id number]          PATIENT TYPE:  INP   LOCATION:  2923                         FACILITY:  [facility]   PHYSICIAN:  Duke Salvia, M.D.  DATE OF BIRTH:  12-03-1955   DATE OF PROCEDURE:  06/11/2004  DATE OF DISCHARGE:                                 OPERATIVE REPORT   PREOPERATIVE DIAGNOSIS:  Recurrent ventricular tachycardia, slow, with  multiple implantable cardioverter-defibrillator discharges.   POSTOPERATIVE DIAGNOSIS:  Recurrent ventricular tachycardia, slow, with  multiple implantable cardioverter-defibrillator discharges.   PROCEDURES:  1.  Invasive electrophysiological study.  2.  Arrhythmia mapping using electrical anatomical systems.  3.  Radiofrequency catheter ablation.   PRIMARY OPERATOR:  Sherryl Manges, M.D.   FIRST ASSISTANT:  Lewayne Bunting, M.D.   Following the obtainment of informed consent, the patient was brought to the  electrophysiology laboratory and placed on the fluoroscopic table in supine  position.  After routine prep and drape, cardiac catheterization with local  anesthesia and conscious sedation.  Noninvasive blood pressure monitoring,  continuous oxygen saturation monitoring and end-tidal CO2 monitoring were  performed continuously throughout the procedure.  Following the procedure,  the patient was transferred to the intensive care unit in stable condition,  his ACT being over 350 with sheaths left in place.   CATHETERS:  A 5 French quadripolar catheter was inserted via the left  femoral vein into the right ventricular apex.   A 5 French quadripolar catheter was inserted via the left femoral vein into  the A-V junction __________ histoelectrogram.   Then 9 French electroanatomical __________ catheter was inserted via the  left femoral artery to the left ventricle.   An 8 French, 8 mm deflectable tip, ablation catheter was inserted via the  right femoral artery to  mapping sites in the left ventricle.   Surface leads 1, AVF and V1 were monitored continuously throughout the  procedure.  Following insertion of the catheters, the stimulation protocol  included:  1.  Incremental atrial pacing.  2.  Incremental ventricular pacing.  3.  Burst single and double atrial electrode stimuli, a pace cycle length of      500 and 400:600 milliseconds.  4.  Burst ventricular pacing.   RESULTS:  Surface electrocardiogram.   The patient presented to the lab in A-D paced rhythm via his [employer]  defibrillator.  The cycle length was 1000 milliseconds.  The PR interval was  270 milliseconds, the QRS duration was 171 milliseconds, the cycle length QT  interval was 474.   P-wave duration 162.   Bundle branch block was present, right.   Preexcitation was absent.   AH interval was 218 milliseconds.   HV interval was 78 milliseconds.   There was no V-A conduction.   ARRHYTHMIAS INDUCED:  At least 7 ventricular tachycardias (AIVRs) were  induced with program stimulation and catheter manipulation.  They included  cycle length at 320, 320, 495, 595, 610, 674.  On two occasions the patient  had to be shocked because of unstable ventricular arrhythmias; otherwise,  all the VTs could be pace terminated or terminated spontaneously.  Electroanatomical mapping demonstrated that the low voltage portion of his  heart included everything but the apex.  The border zone was responsible for  the slow conduction and it was this area using the [facility] that we then  targeted.  Radiofrequency applications were applied at the sites of early  activation along the rim of this junction with subsequent elimination of  inducibility of his ventricular tachycardia.  We then carpet bombed  overlying the area where there was a cord or between the initial elimination  sites and the border zone of his high voltage heart muscle.   A total of 1 hour and 7 minutes of RF energy were applied.  A  total of 1  hour and 7 minutes of fluoroscopy time was utilized at 7.5 frames per second  in both a monoplane and biplane manner.   RADIOFREQUENCY ENERGY:  A total of 30 minutes and 58 seconds of RF energy  was applied under electroanatomical guidance.  Following RF applications,  the patient's ventricular tachycardia was no longer inducible and the  corridors of slow conduction had been eliminated.   IMPRESSION:  1.  Sinus node function with:      1.  Backup atrial pacing.  2.  Prolonged antegrade AV nodal conduction with no evidence of V-A      conduction.  3.  Moderate prolongation of the HV interval.  4.  Right bundle branch block.  5.  No accessory pathway function.  6.  Multiple inducible ventricular tachycardias as noted above.  Following      ablation, the slow ventricular tachycardias were no longer inducible.      The patient did have on one occasion a very fast ventricular tachycardia      flush ventricular fibrillation which required cardioversion.   The patient tolerated his procedure amazingly well.   It should be noted that the patient came to the lab with an INR of 1.8 drawn  48 hours ago.  It was our understanding that the patient had not taken his  Coumadin and then it became unclear as to whether the patient had in fact  taken his Coumadin and I elected to give him Vitamin K at the beginning of  the procedure to facilitate sheath removal.  At the end of the procedure the  patient's ACT was still in the 320 range and we had targeted an ACT of 150-  175 for sheath removal, given the size of the arterial sheaths.   The patient otherwise tolerated the procedure well.      SCK/MEDQ  D:  06/11/2004  T:  06/12/2004  Job:  [id number]   cc:   Electrophysiology Laboratory   [facility]

## 2010-09-17 NOTE — H&P (Signed)
NAME:  Michael Krueger, Michael Krueger                     ACCOUNT NO.:  [id number]   MEDICAL RECORD NO.:  [id number]                   PATIENT TYPE:  INP   LOCATION:  2108                                 FACILITY:  [facility]   PHYSICIAN:  Learta Codding, M.D. [facility]             DATE OF BIRTH:  Jun 22, 1955   DATE OF ADMISSION:  01/16/2004  DATE OF DISCHARGE:                                HISTORY & PHYSICAL   HISTORY OF PRESENT ILLNESS:  Michael Krueger is a 55 year old white male with  known ischemic heart disease, status post prior anterior wall myocardial  infarction and percutaneous coronary intervention to the LAD.  The patient's  NYHA class II heart failure.  He has now been admitted with two episodes of  ICD discharge.  The patient has a prior history of ventricular tachycardia  storm and more recently frequent episodes of antitachycardia pacing on  defibrillator interrogation.  He was last seen by Dr. Graciela Husbands in the clinic on  January 14, 2004.  At that time, it was noted that the patient had  frequent antitachycardiac pacing but his device was otherwise functioning  properly.  He did not report any worsening symptoms of heart failure or  substernal chest pain.  Of note is that the patient in the past required  amiodarone for VT suppression.  The later therapy was discontinued  approximately three weeks ago due to evidence of hyperthyroidism by  laboratory testing.  Apparently, the patient, however, was clinically not  thyroid toxic.  He was seen by Dr. Talmage Nap, initially started on methimazole.  Shortly thereafter, he was then discontinued of amiodarone.  Reportedly the  patient now is euthyroid.  Michael Krueger was transferred from [place] where  he presented after his second shock today.  On his arrival to [facility]  Intensive Care Unit, he is essentially symptom free.  He denies any  substernal chest pain or worsening shortness of breath.  He has occasional  PVCs but no evidence of recurrent  ventricular tachycardia.  He does tell me  that over the last several weeks he has had frequent sensation of  palpitations which he contributes to antitachycardia pacing.  Yesterday  reportedly he also had frequent episodes.   Please see past medical history, social history, family history, review of  systems, and medication history per Dr. Liliane Channel note in the chart.   PHYSICAL EXAMINATION:  VITAL SIGNS:  Stable with a blood pressure of 98/60,  heart rate is 63 beats per minute atrial paced, his temperature is afebrile.  NECK:  Reveals no definite abdominojugular reflux.  JVD is elevated.  There  are no carotid bruits.  LUNGS:  Reveal faint crackles at the base.  HEART:  Reveals a regular rate and rhythm with a normal S1 and S2, and an  audible S3.  ABDOMEN:  Soft.  EXTREMITIES:  Reveal no edema.   Electrocardiogram was noted for atrial paced rhythm with a first degree A-V  block and right bundle branch block with old anterior wall myocardial  infarction.   Laboratory testing was done at [facility] and essentially  within normal limits short of an elevated MCV at 103.  The patient had no  definite electrolytes abnormalities.   PROBLEM LIST:  1.  Recurrent ICD discharge x 2 today.  2.  History of VT storm and frequent antitachycardia  pacing.  3.  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  The patient is now currently euthyroid post 3-4 weeks of      therapy suggestive of type 1 hyperthyroidism (no clinical evidence of      thyroid toxicosis).  4.  Ischemic heart disease, status post __________  myocardial infarction      with a very large scar on the anterior wall.  On recent Cardiolite EF of      16%.  5.  Class II congestive heart failure.  6.  Status post ICD implantation, Medtronic device 7288.  7.  Atrial paced rhythm with right bundle branch  block and first degree A-V      block.  8.  Warfarin anticoagulation.  9.  Dyslipidemia.  10. History of Hodgkin disease.   RECOMMENDATIONS AND PLAN:  1.  Interrogate ICD to make sure the patient had appropriate discharges.      This can be performed in the morning by Dr. Ladona Ridgel.  2.  If the patient has recurrent and refractory ventricular tachycardia,      amiodarone will be acutely instituted intravenously.  3.  Chronic therapy for arrhythmia suppression which may include amiodarone      plus methimazole versus __________  versus ablative therapy.  As per the      note of Dr. Graciela Husbands, a preference was given to ablative therapy on an      elective basis.  The patient will be seen tomorrow by Dr. Ladona Ridgel.  We      will let him make the final decision.  4.  Rule out myocardial infarct and ischemia.  However, no clinical evidence      of worsening ischemia and recent Cardiolite was also negative for      ischemia.  The patient can be continued on his antianginal medications.  5.  Check free T4, T3, and TSH.  If the patient is truly euthyroid, this      suggests that the patient developed type 1 hyperthyroidism and if so he      still could potentially be treated long term with amiodarone with      conjunctive therapy of methimazole.  6.  Elevated MCV.  Liver function tests will be evaluated in the morning.  7.  Discontinue Warfarin anticoagulation and start heparin in anticipation      of ablative therapy.                                                Learta Codding, M.D. [facility]    GED/MEDQ  D:  01/16/2004  T:  01/16/2004  Job:  [id number]   cc:   Duke Salvia, M.D.   Olga Millers, M.D. [facility]   Dorisann Frames, M.D.  [phone] [address], [place] [address]  Fax: [phone]

## 2010-09-17 NOTE — H&P (Signed)
Michael Krueger, [place]           ACCOUNT NO.:  [id number]   MEDICAL RECORD NO.:  [id number]          PATIENT TYPE:  EMS   LOCATION:  MAJO                         FACILITY:  [facility]   PHYSICIAN:  Hollice Espy, M.D.DATE OF BIRTH:  [birth year]   DATE OF ADMISSION:  08/21/2006  DATE OF DISCHARGE:                              HISTORY & PHYSICAL   CHIEF COMPLAINT:  Cough and fever.   HISTORY OF PRESENT ILLNESS:  The patient is a 55 year old white male  with past medical history of Hodgkin's disease, status post a  splenectomy; and ischemic cardiomyopathy, with an ejection fraction of  15-20% with pacemaker.  The patient smokes up to 1/2 pack a day, but has  been feeling at his baseline; when in the last day or two he has noted  nonproductive cough, some dysuria, lower back pain and fever.  He came  into the emergency room with these concerns.  He was noted to have, on  admission, a temperature of 101.3.  His blood pressure was borderline at  98/59.  His saturations were about 92% on room air.   A chest x-ray was done, which actually showed a slightly enlarged heart,  but was otherwise unremarkable.  It showed no evidence of any infiltrate  or congestive heart failure signs.   Labs were checked, and he was found to have a white count of 13.4, with  an MCV of 104; but, no shift.  His electrolytes noted a creatinine of  1.3, but otherwise unremarkable.  Cardiac enzymes were unremarkable.  Blood cultures were drawn.   REVIEW OF SYSTEMS:  Currently the patient is doing okay.  He complains  of some nonproductive cough, some mild shortness of breath; but  otherwise doing okay.  He denies any headaches, vision changes,  dysphagia; no chest pain, palpitations.  He has some mild wheezing and  nonproductive cough.  He denies any abdominal pain; no hematuria.  He  does note some dysuria in the last day; but no constipation or diarrhea.  No focal extremity numbness, weakness or pain.  His  review of systems is  otherwise negative.   PAST MEDICAL HISTORY:  1. Ischemic cardiomyopathy with an ejection fraction of 15-20%.  He is      status post an AICD.  2. History of tobacco abuse.  3. Treated hypothyroidism.  4. History of ventricular tachycardia, status post radiofrequency      catheter ablation.  5. He is on chronic anticoagulation, secondary to apical myocardial      infarction.   MEDICATIONS:  1. Folic acid 1 mg p.o. daily.  2. Aspirin 81 mg p.o. daily.  3. Aldactone 25 mg p.o. daily.  4. Digoxin 0.125 mg p.o. daily.  5. Niaspan 1 g p.o. daily.  6. Altace 5 mg p.o. b.i.d.  7. Lasix 20 mg p.o. daily.  8. Lipitor 40 mg p.o. daily.  9. Coreg 6.25 mg p.o. b.i.d.  10.Coumadin as directed.  11.Amiodarone 200 mg p.o. daily.   ALLERGIES:  NO KNOWN DRUG ALLERGIES.   The patient also has not been taking some of his medications, as he says  he  ran out.   SOCIAL HISTORY:  He drinks, but not heavily.  Smokes about 1/2 pack per  day.  Denies any drug use.   FAMILY HISTORY:  Noncontributory.   PHYSICAL EXAMINATION:  VITAL SIGNS:  (on admission)  Temperature 101.3,  heart rate 60, blood pressure 98/59, respirations 18, O2 saturation 92%  on room air.  GENERAL:  The patient is alert and oriented x3.  No major stress.  HEENT:  Normocephalic and atraumatic.  Mucous membranes are moist.  NECK:  He has no carotid bruits.  HEART:  Irregular rate and rhythm.  S1 and S2 is a paced rhythm.  LUNGS:  He has bilateral expiratory wheezing.  No rhonchi.  ABDOMEN:  Soft, nontender, nondistended.  Positive bowel sounds.  EXTREMITIES:  Show no clubbing, cyanosis with trace pitting edema.   LABORATORY DATA:  White count 13.4, hemoglobin 15, hematocrit 44, MCV  104, platelet count 197 with no shift.  Sodium 136, potassium 4.5,  chloride 107, bicarb 22, BUN 13, creatinine 1.3, glucose 100.  CPK 132,  MB less than 1.  Troponin I less than 0.05.  Second set is similar.  Blood cultures  are drawn and pending.   ASSESSMENT AND PLAN:  1. FEVER AND LEUKOCYTOSIS.  I suspect that some of this may be a      bronchitic component, especially with the patient's wheezing.  We      will put him on albuterol, oxygen and treat with IV Rocephin and      Zithromax..  With this coverage of antibiotics, we can also treat a      possible urinary tract infection.  I await his UA which is still      pending.  2. HISTORY OF ISCHEMIC CARDIOMYOPATHY.  Contine aspirin, Coumadin,      digoxin and his other medications.  3. TOBACCO ABUSE.  Per my counseling.  4. MACROCYTIC ANEMIA.  I question if the patient is a heavy alcohol      abuser, which may be a contributing factor to his ischemic      cardiomyopathy.  We will watch him for withdrawals.  Apparently he      denies any heavy drinking.      Hollice Espy, M.D.  Electronically Signed     SKK/MEDQ  D:  08/22/2006  T:  08/22/2006  Job:  [id number]   cc:   Dorisann Frames, M.D.  [name] [name], MD, [facility]

## 2010-09-17 NOTE — Op Note (Signed)
[place]. [facility]  Patient:    Michael Krueger, Michael Krueger                  MRN: [id number] Proc. Date: 03/08/00 Adm. Date:  [id number] Disc. Date: [id number] Attending:  Nathen May CC:         Electrophysiology Laboratory  Physician, [place]   Operative Report  PREOPERATIVE DIAGNOSIS:  Ventricular tachycardia, status post amiodarone initiation.  POSTOPERATIVE DIAGNOSIS:  Adequate defibrillation thresholds.  PROCEDURE:  EP study via his implantable defibrillator.  INTERROGATION:  Mr. Schreier Medtronic [place] [id number] ICD with an atrial port plug demonstrated an R-wave of 6 mV with patient impedance of 275, which is consistent in the peri-300 number postimplant, anesthesia threshold was 1 V at 0.4 msec.  The high-voltage impedance was 14.  With these acceptable parameters reported, electrophysiological testing was undertaken.  Via the T-wave shock, ventricular tachycardia with a cycle length of approximately 280 msec was induced.  This was detected in the VF zone, and then a 25 joule shock was delivered through a measured resistance of 43 ohms, terminating ventricular tachycardia and restoring sinus rhythm.  Ventricular tachycardia was then reinduced via the T-wave shock. Intertachycardia patient x3 failed to detect.  There was a VF couplet interval during redetection, prompting VF therapy.  The 25 joules shock was delivered through a measured resistance of 43 ohms, terminating the ventricular tachycardia and restoring sinus rhythm.  After an acceptable wait again, ventricular defibrillation was induced this time via the T-wave shock; a 25 joules shock was delivered through a measure distance of 41 ohms, terminating ventricular fibrillation and restoring sinus rhythm.  The defibrillator was reprogrammed as a three zone device with the first shocks in the VT zones at 20 joules, in the VS zone at 25 joules, intertachycardia pacing was  reprogrammed mildly in the fast VT zone. DD:  03/07/00 TD:  03/08/00 Job: [id number]

## 2010-09-17 NOTE — H&P (Signed)
NAME:  Michael Krueger, Michael Krueger                     ACCOUNT NO.:  [id number]   MEDICAL RECORD NO.:  [id number]                   PATIENT TYPE:  EMS   LOCATION:  MAJO                                 FACILITY:  [facility]   PHYSICIAN:  Rollene Rotunda, M.D.                DATE OF BIRTH:  08/21/1955   DATE OF ADMISSION:  10/11/2002  DATE OF DISCHARGE:                                HISTORY & PHYSICAL   PRIMARY CARE PHYSICIAN:  Dr. Phineas Real at [facility] in [place].   CARDIOLOGIST:  Olga Millers, M.D.   REASON FOR PRESENTATION:  Palpitations.   HISTORY OF PRESENT ILLNESS:  The patient is a very pleasant 55 year old  gentleman with a long cardiac history and ischemic cardiomyopathy.  He has  ventricular tachycardia and is status post Implantable defibrillator  placement (I do not have his old records available at this time).  He was  doing quite well.  He says he is active and can do his activities of daily  living without limitations.  However, at 11 o'clock he developed tacky  palpitations.  He has had these before.  He felt fatigue and just bad when  he started.  He did not have any presyncope or syncope.  He denied having  any chest discomfort, neck discomfort, arm discomfort.  He was short of  breath and had some orthopnea.  He did feel slightly light headed.  He  presented to the emergency room where he was noted to be in a wide complex  regular rhythm with a right bundle branch morphology and right axis  deviation.  He had his defibrillator interrogated and was found to be in  ventricular tachycardia.  He spontaneously converted to normal sinus rhythm  while in the emergency room.   PAST MEDICAL HISTORY:  1. Ischemic cardiomyopathy (EF 27%).  2. Coronary artery disease (LAD mid InStent 20% stenosis, distal 30%     stenosis, D2 30% stenosis at the origin, circumflex mid InStent 10%     stenosis, distal 50% stenosis, OM-1 90% stenosis at the origin, OM-2 50%     stenosis,  right coronary artery occluded with left-to-right collaterals).  3. Paroxysmal atrial fibrillation, status post Implantable defibrillator     with a history of ventricular tachycardia, history of sick sinus     syndrome.  4. History of Hodgkin's disease.  5. Hyperlipidemia.   PAST SURGICAL HISTORY:  Splenectomy.   ALLERGIES:  None.   MEDICATIONS:  1. Folic acid one mg daily.  2. Coumadin.  3. Aspirin 81 mg daily.  4. Aldactone 25 mg daily.  5. Lanoxin 0.125 mg daily.  6. Niaspan one gram daily.  7. Coreg 12.5 mg b.i.d.  8. Amiodarone 200 mg daily.  9. Altace 5 mg b.i.d.  10.      Lipitor 40 mg q.h.s.  11.      Lasix 20 mg daily.   SOCIAL HISTORY:  The patient lives  with his girlfriend in [place].  He  quit smoking two years ago.  He is disabled.   FAMILY HISTORY:  Noncontributory for early coronary artery disease.   REVIEW OF SYSTEMS:  As stated in the HPI.  Negative for other systems.   PHYSICAL EXAMINATION:  GENERAL: The patient is in no distress.  VITAL SIGNS: Blood pressure 102/63, heart rate 120 and regular (60's now  that he has broken with A-V paced rhythm).  HEENT: Eyes unremarkable, pupils are equal, round and reactive to light.  Fundi not visualized.  Oral mucosa unremarkable, poor dentition.  NECK: No jugular venous distension, wave form within normal limits, carotid  upstrokes brisk and symmetric, no bruits, no thyromegaly.  LYMPHATICS: No cervical, axillary, inguinal adenopathy.  LUNGS: Clear to auscultation bilaterally.  BACK: No costovertebral angle tenderness.  CHEST: Well-healed left upper ICD pocket.  HEART:  The patient PMI is not displaced, is sustained, S1 and S2 within  normal limits, no S3, no S4, no murmurs.  ABDOMEN: Obese, positive bowel sounds, normal in frequency.  No bruits, no  rebound, guarding,  midline, no pulsatile mass.  No hepatomegaly, no  splenomegaly.  SKIN: No rash.  EXTREMITIES: Pulses 2+ throughout.  No lower extremity  edema.  NEUROLOGIC: Oriented to person, place and time.  Cranial nerves II-XII  grossly intact.  Motor grossly intact.  EKG (ventricular tachycardia, rate 122, right bundle branch block  morphology, probable old anteroseptal myocardial infarction).  CHEST X-RAY: Cardiomegaly, no acute disease.   LABORATORY DATA:  WBC 10,200, hemoglobin 14.5.  Sodium 140, potassium 4.3,  BUN 16, creatinine 1.2.  INR 2.8, AST 36, ALT 38.   ASSESSMENT AND PLAN:  1. Ventricular tachycardia.  The patient has ventricular tachycardia with a     rate of 120.  He terminated spontaneously.  He has had his ICD pre-     programmed to lower the rate at which the pace termination would begin.     Further decisions about increasing his amiodarone will be deferred to     Drs. Ladona Ridgel and Graciela Husbands.  The patient will be admitted over night.  2. Ischemic cardiomyopathy on an excellent medical regimen.  He will     continue on the current therapy.                                               Rollene Rotunda, M.D.    JH/MEDQ  D:  10/11/2002  T:  10/12/2002  Job:  [id number]

## 2010-09-17 NOTE — Discharge Summary (Signed)
NAMELAYDEN, CATERINO           ACCOUNT NO.:  [id number]   MEDICAL RECORD NO.:  [id number]          PATIENT TYPE:  INP   LOCATION:  2923                         FACILITY:  [facility]   PHYSICIAN:  Duke Salvia, M.D.  DATE OF BIRTH:  1955/11/05   DATE OF ADMISSION:  06/11/2004  DATE OF DISCHARGE:  06/12/2004                                 DISCHARGE SUMMARY   PROCEDURES:  Radiofrequency catheter ablation of ventricular tachycardia.   HOSPITAL COURSE:  Mr. Masur is a 55 year old male with a history of VT  and ischemic cardiomyopathy.  He has a history of slow VT in the range of  115 to 120 beats per minute.  He was evaluated in the office, and it was  felt that radiofrequency catheter ablation was indicated.  This was  scheduled, and he came in for the procedure on June 11, 2004.   Dr. Graciela Husbands performed radiofrequency catheter ablation of the VT, which was  successful.  He decreased his amiodarone from 200 mg b.i.d. to 200 mg daily.  He is to follow up in the office and arrange a CPX test at that time.   On June 12, 2004, Mr. Solum was without chest pain or shortness of  breath.  His catheter sites were without ecchymosis or hematoma.  He was  doing well and considered stable for discharge on June 12, 2004 with  outpatient followup arranged.   DISCHARGE DIAGNOSES:  1.  Ventricular tachycardia, rate 115 to 120 beats per minute, status post      radiofrequency catheter ablation.  2.  Status post implantable cardioverter/defibrillator for ventricular      tachycardia, discharged April 26, 2004.  3.  Ischemic cardiomyopathy with an ejection fraction in the 15-20% range.  4.  Status post catheterization September 2001 with percutaneous      transluminal coronary angioplasty and stent to a totally occluded left      anterior descending reducing the stenosis to less than 10%, residual      disease of 60-70% in the left anterior descending with TIMI-3 flow,  circumflex with about 30% in-stent restenosis and a marginal branch with      about 70% ostial narrowing, second marginal branch 70% narrowing in the      area of previous rotablation, right coronary artery diffusely diseased      and then totally occluded, ejection fraction 20%.  5.  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  6.  Chronic anticoagulation secondary to apical myocardial infarction in      2001.  7.  Treated hypothyroidism.   DISCHARGE INSTRUCTIONS:  1.  He is to do no driving for the next two days, no straining or heavy      lifting for two weeks.  2.  He is to call the office for problems with the catheter site.  3.  He is stick to a low-salt and low-fat diet.  4.  He is to resume his Coumadin and follow up in the [facility] on      Friday, June 18, 2004.  5.  He is to see Dr. [name] on June 21, 2004  at 11:45 a.m. and see Dr.      Graciela Husbands on July 06, 2004 at 9:30 a.m.  6.  He is to follow up with Dr. Talmage Nap as needed or as scheduled.   DISCHARGE MEDICATIONS:  1.  Folic acid 1 mg daily.  2.  Aspirin 81 mg daily for at least six weeks.  3.  Aldactone 25 mg daily.  4.  Lanoxin 0.125 mg daily.  5.  Niaspan 1 g daily.  6.  Altace 5 mg b.i.d.  7.  Lasix 20 mg daily.  8.  Lipitor 40 mg daily.  9.  Coreg 6.25 mg b.i.d.  10. Coumadin as directed.  11. Amiodarone 200 mg daily.   The patient states he is no longer taking Tapazole.      RB/MEDQ  D:  06/12/2004  T:  06/13/2004  Job:  [id number]   cc:   Dorisann Frames, M.D.  [address], [place] [address]  Fax: [phone]   Olga Millers, M.D. [facility]

## 2010-09-17 NOTE — Cardiovascular Report (Signed)
NAME:  Michael Krueger, Michael Krueger           ACCOUNT NO.:  [id number]   MEDICAL RECORD NO.:  [id number]          PATIENT TYPE:  OIB   LOCATION:  1963                         FACILITY:  [facility]   PHYSICIAN:  Charlton Haws, M.D.     DATE OF BIRTH:  09/07/1955   DATE OF PROCEDURE:  09/05/2005  DATE OF DISCHARGE:                              CARDIAC CATHETERIZATION   CORONARY ARTERIOGRAPHY:   INDICATION:  History of cardiomyopathy with AICD, increased frequency of  ventricular tachycardia.   Cine catheterization done with 4-French catheters from right femoral artery.   Left main coronary artery was normal.   There was a stent in the proximal LAD at the takeoff of first diagonal  branch.  It was widely patent.  Distal to the stent there was 30-40%  multiple discrete lesions in the LAD.  First diagonal branch was normal and  took off from the proximal stent.   The circumflex coronary artery was non-dominant.  There were 30% multi  discrete lesions proximally.  The mid vessel had a widely patent stent.  Just distal to the stent there was an extremely small 1 mm or less first  obtuse marginal branch with a 70% ostial stenosis.   The remainder of the circumflex was normal.   The right coronary artery had 40-50% multiple discrete lesions proximally.  The mid vessel was 100% occluded with right-to-right collaterals.   RAO VENTRICULOGRAPHY:  RAO ventriculography showed severe left ventricular  cavity enlargement with global hypokinesis.  The EF was 10-15%.  There was  at least angiographic grade 1 MR.   Aortic pressure was in the range of 107/70.  LV pressure was 105/15.   IMPRESSION:  The patient's coronaries are still well revascularized.  His  left ventricular dysfunction is disproportionate to the total right coronary  artery.   I do not think that there is any revascularization that can be done that  would help decrease the frequency of his ventricular tachycardia.   I think compliance is  one issue with the patient.  In talking to him he has  not had any amiodarone in the last three days.  We will give him 400 mg here  today in the laboratory and I encouraged him to fill his prescription today.   He will follow up with Dr. Graciela Husbands in regards to further therapy for his  ventricular tachycardia.           ______________________________  Charlton Haws, M.D.     PN/MEDQ  D:  09/05/2005  T:  09/06/2005  Job:  [id number]   cc:   Duke Salvia, M.D.  [address]  [address]  [place]  [place] [address]   Olga Millers, M.D. [facility]  [address]  [address]  [place]  [place] [address]

## 2010-09-17 NOTE — Discharge Summary (Signed)
Michael Krueger, Michael Krueger           ACCOUNT NO.:  [id number]   MEDICAL RECORD NO.:  [id number]          PATIENT TYPE:  INP   LOCATION:  5731                         FACILITY:  [facility]   PHYSICIAN:  Michelene Gardener, MD    DATE OF BIRTH:  09-27-1955   DATE OF ADMISSION:  08/21/2006  DATE OF DISCHARGE:  08/22/2006                               DISCHARGE SUMMARY   PRIMARY CARE PHYSICIAN:  Dr. Dorisann Frames   DISCHARGE DIAGNOSES:  1. Acute bronchitis.  2. Leukocytosis.  3. Ischemic cardiomyopathy with ejection fraction of 15 to 20.  4. History of tobacco abuse.  5. Hypothyroidism.  6. History of ventricular tachycardia.  7. Chronic anticoagulation.   DISCHARGE MEDICATIONS:  Those medications were continued from his home  included:  1. Folic acid 1 mg p.o. once daily.  2. Aspirin 81 mg p.o. once daily.  3. Digoxin 0.125 mg p.o. once daily.  4. Lipitor 40 mg p.o. once daily.  5. Niaspan 1 gm p.o. once daily.  6. Lasix 20 mg p.o. once daily.  7. Amiodarone 200 mg p.o. once daily.  8. Coumadin as taken at home.  9. Coreg 6.25 mg p.o. twice daily.   The following medications were changed:  1. His Altace was decreased to 2.5 mg p.o. once daily.  2. Aldactone was changed to 25 mg p.o. every other day.   CONSULTATIONS:  None.   PROCEDURES:  None.   FOLLOWUP APPOINTMENT:  His primary doctor in one to two weeks.   RADIOLOGY STUDIES:  He had a chest x-ray done on April 21 that showed  cardiomegaly with evidence of bibasilar atelectasis and no evidence of  acute heart disease.   COURSE OF HOSPITALIZATION:  1. Bronchitis.  This patient was admitted to the floor and was given      IV antibiotics.  His chest x-ray came to be normal with no evidence      of acute pneumonia.  Next day, his white count was improved from      13.5 up to 12.5.  Currently, he is asymptomatic, does not have      shortness of breath and he is saturating well.  Will be sent home      on Avelox 400 mg for one  week.  2. Hypotension.  This patient has been in the hypotensive side and is      getting a lot of medications.  I continued his digoxin, his Lasix,      his amiodarone and his Coreg.  I decreased his Altace from 5 mg      twice daily to 2.5 mg once a day and I also decreased his aldactone      to 25 mg every other day.   DISPOSITION:  Otherwise, other medical conditions remained stable and  patient was continued on the rest of medications taken at home.   The assessment time is 40 minutes.      Michelene Gardener, MD  Electronically Signed     NAE/MEDQ  D:  08/22/2006  T:  08/22/2006  Job:  [id number]   cc:   Bindubal Balan,  M.D. 

## 2010-09-17 NOTE — Discharge Summary (Signed)
[place]  Patient:    Michael Krueger, Michael Krueger                  MRN: [id number] Adm. Date:  [id number] Disc. Date: [id number] Attending:  Cathren Laine Dictator:   Chinita Pester, C.N.P. CC:         Dr. Cassandria Santee, [place], [place]  [name]. [name], M.D. [facility]   Discharge Summary  HISTORY OF PRESENT ILLNESS:  This is a 55 year old gentleman with a past medical history of MI, cardiomyopathy status post ICD, who recently was discharged in June for defibrillating firing.  He was in his usual state of health until the morning of admission when his defibrillator fired at rest. He went to our office and the device was interrogated.  It showed six episodes of ATP with approximately 50 episodes of nonsustained VT since June.  This was the first defibrillator charge for monomorphic VT.  Last admission DVTs were done on amiodarone with adequate thresholds.  HOSPITAL COURSE:  The patient was admitted. Cardiac enzymes were performed which ruled out for MI. The patients amiodarone was increased.  The patient was seen by Nathen May, M.D., [facility], and he was discharged to home after venogram of his left upper extremity to assess patency for placement of a dual chamber device.  DISCHARGE MEDICATIONS:  He was discharged on all of his previous medications. 1. Amiodarone was increased to 400 a day for two weeks. 2. Altace 2.5 twice a day. 3. Coreg 6.25 twice a day. 4. Digitek 0.125 daily. 5. Lipitor 10 nightly. 6. Aspirin 81 daily. 7. Folic acid one daily. 8. Aldactone 25 daily. 9. Coumadin 1.25 daily. 10. Niacin 1000 mg nightly.  DIET:  Low fat, low cholesterol diet.  FOLLOW-UP:  The office was to call him to set up an appointment for an upgrade to dual chamber device.  He was instructed if he did not hear from the office within a couple of days, to give Annice Pih, in the office, a call and he was supplied with the phone number. DD:  11/22/00 TD:  11/23/00 Job:  [id number]

## 2010-09-24 ENCOUNTER — Telehealth: Payer: Self-pay | Admitting: Cardiology

## 2010-09-24 ENCOUNTER — Other Ambulatory Visit: Payer: Self-pay | Admitting: *Deleted

## 2010-09-24 MED ORDER — AMIODARONE HCL 200 MG PO TABS: ORAL_TABLET | ORAL | Status: DC

## 2010-09-24 NOTE — Telephone Encounter (Signed)
See phone note

## 2010-09-24 NOTE — Telephone Encounter (Signed)
pls call after 330p [phone]

## 2010-09-24 NOTE — Telephone Encounter (Signed)
Pt rtn call re a prescription

## 2010-10-04 ENCOUNTER — Encounter: Payer: Medicare Other | Admitting: *Deleted

## 2010-10-06 ENCOUNTER — Ambulatory Visit (INDEPENDENT_AMBULATORY_CARE_PROVIDER_SITE_OTHER): Payer: Medicare Other | Admitting: *Deleted

## 2010-10-06 DIAGNOSIS — Z7901 Long term (current) use of anticoagulants: Secondary | ICD-10-CM

## 2010-10-06 DIAGNOSIS — I4891 Unspecified atrial fibrillation: Secondary | ICD-10-CM

## 2010-10-06 LAB — POCT INR: INR: 3.2

## 2010-10-14 ENCOUNTER — Encounter: Payer: Medicare Other | Admitting: *Deleted

## 2010-10-18 ENCOUNTER — Encounter: Payer: Self-pay | Admitting: *Deleted

## 2010-10-22 ENCOUNTER — Other Ambulatory Visit: Payer: Self-pay

## 2010-10-22 ENCOUNTER — Ambulatory Visit (INDEPENDENT_AMBULATORY_CARE_PROVIDER_SITE_OTHER): Payer: Medicare Other | Admitting: *Deleted

## 2010-10-22 DIAGNOSIS — I5022 Chronic systolic (congestive) heart failure: Secondary | ICD-10-CM

## 2010-10-22 DIAGNOSIS — I4891 Unspecified atrial fibrillation: Secondary | ICD-10-CM

## 2010-10-22 DIAGNOSIS — I442 Atrioventricular block, complete: Secondary | ICD-10-CM

## 2010-10-27 ENCOUNTER — Other Ambulatory Visit: Payer: Self-pay | Admitting: Cardiology

## 2010-11-02 NOTE — Progress Notes (Signed)
icd remote w/icm  

## 2010-11-04 ENCOUNTER — Ambulatory Visit (INDEPENDENT_AMBULATORY_CARE_PROVIDER_SITE_OTHER): Payer: Medicare Other | Admitting: *Deleted

## 2010-11-04 DIAGNOSIS — I4891 Unspecified atrial fibrillation: Secondary | ICD-10-CM

## 2010-11-04 DIAGNOSIS — Z7901 Long term (current) use of anticoagulants: Secondary | ICD-10-CM

## 2010-11-04 LAB — POCT INR: INR: 3

## 2010-11-10 ENCOUNTER — Encounter: Payer: Self-pay | Admitting: *Deleted

## 2010-11-16 ENCOUNTER — Telehealth: Payer: Self-pay | Admitting: Cardiology

## 2010-11-16 NOTE — Telephone Encounter (Signed)
Per pt call. Pt having problems with medications, it is becoming more frequent. Pt c/o skin feeling as if it is on fire. Pt believes niaspan is the culprit of the negative side effects.  Please return pt call to advise.

## 2010-11-16 NOTE — Telephone Encounter (Signed)
Spoke with pt, okay given for him to stop the niaspan to see if his symptoms improve. He will call and let us know the outcome Michael Krueger

## 2010-12-06 ENCOUNTER — Ambulatory Visit (INDEPENDENT_AMBULATORY_CARE_PROVIDER_SITE_OTHER): Payer: Medicare Other | Admitting: *Deleted

## 2010-12-06 DIAGNOSIS — Z7901 Long term (current) use of anticoagulants: Secondary | ICD-10-CM

## 2010-12-06 DIAGNOSIS — I4891 Unspecified atrial fibrillation: Secondary | ICD-10-CM

## 2010-12-06 LAB — POCT INR: INR: 3.2

## 2011-01-05 ENCOUNTER — Encounter: Payer: Self-pay | Admitting: *Deleted

## 2011-01-05 ENCOUNTER — Ambulatory Visit (INDEPENDENT_AMBULATORY_CARE_PROVIDER_SITE_OTHER): Payer: Medicare Other | Admitting: *Deleted

## 2011-01-05 ENCOUNTER — Encounter: Payer: Self-pay | Admitting: Cardiology

## 2011-01-05 DIAGNOSIS — I4891 Unspecified atrial fibrillation: Secondary | ICD-10-CM

## 2011-01-05 DIAGNOSIS — Z7901 Long term (current) use of anticoagulants: Secondary | ICD-10-CM

## 2011-01-06 ENCOUNTER — Ambulatory Visit (INDEPENDENT_AMBULATORY_CARE_PROVIDER_SITE_OTHER): Payer: Medicare Other | Admitting: Cardiology

## 2011-01-06 ENCOUNTER — Encounter: Payer: Self-pay | Admitting: Cardiology

## 2011-01-06 ENCOUNTER — Ambulatory Visit (INDEPENDENT_AMBULATORY_CARE_PROVIDER_SITE_OTHER)
Admission: RE | Admit: 2011-01-06 | Discharge: 2011-01-06 | Disposition: A | Discharge status: Home / Self Care | Payer: Medicare Other | Source: Ambulatory Visit | Attending: Cardiology | Admitting: Cardiology

## 2011-01-06 DIAGNOSIS — I2589 Other forms of chronic ischemic heart disease: Secondary | ICD-10-CM

## 2011-01-06 DIAGNOSIS — I5022 Chronic systolic (congestive) heart failure: Secondary | ICD-10-CM

## 2011-01-06 DIAGNOSIS — Z7901 Long term (current) use of anticoagulants: Secondary | ICD-10-CM

## 2011-01-06 DIAGNOSIS — I2510 Atherosclerotic heart disease of native coronary artery without angina pectoris: Secondary | ICD-10-CM

## 2011-01-06 DIAGNOSIS — Z79899 Other long term (current) drug therapy: Secondary | ICD-10-CM

## 2011-01-06 DIAGNOSIS — I509 Heart failure, unspecified: Secondary | ICD-10-CM

## 2011-01-06 DIAGNOSIS — R0602 Shortness of breath: Secondary | ICD-10-CM

## 2011-01-06 DIAGNOSIS — E785 Hyperlipidemia, unspecified: Secondary | ICD-10-CM

## 2011-01-06 DIAGNOSIS — I472 Ventricular tachycardia: Secondary | ICD-10-CM

## 2011-01-06 LAB — HEPATIC FUNCTION PANEL
AST: 30 U/L (ref 0–37)
Albumin: 3.5 g/dL (ref 3.5–5.2)
Alkaline Phosphatase: 67 U/L (ref 39–117)
Bilirubin, Direct: 0 mg/dL (ref 0.0–0.3)
Total Protein: 6.8 g/dL (ref 6.0–8.3)

## 2011-01-06 LAB — BASIC METABOLIC PANEL
BUN: 19 mg/dL (ref 6–23)
CO2: 22 mEq/L (ref 19–32)
Calcium: 8.6 mg/dL (ref 8.4–10.5)
Chloride: 110 mEq/L (ref 96–112)
Creatinine, Ser: 1.1 mg/dL (ref 0.4–1.5)

## 2011-01-06 LAB — LIPID PANEL
Cholesterol: 149 mg/dL (ref 0–200)
Triglycerides: 176 mg/dL — ABNORMAL HIGH (ref 0.0–149.0)

## 2011-01-06 LAB — TSH: TSH: 1.27 u[IU]/mL (ref 0.35–5.50)

## 2011-01-06 LAB — BRAIN NATRIURETIC PEPTIDE: Pro B Natriuretic peptide (BNP): 237 pg/mL — ABNORMAL HIGH (ref 0.0–100.0)

## 2011-01-06 NOTE — Assessment & Plan Note (Signed)
The patient notes some increased shortness of breath. However he feels this may be related to a URI. Check a BNP. Take additional 40 mg of Lasix daily p.r.n.

## 2011-01-06 NOTE — Assessment & Plan Note (Signed)
The patient remains in AV paced  rhythm. Continue amiodarone and beta blocker. Continue Coumadin. Check TSH, chest x-ray and liver functions for amiodarone use.

## 2011-01-06 NOTE — Progress Notes (Signed)
HPI: Michael Krueger is a pleasant gentleman with a history of coronary artery disease, ischemic cardiomyopathy, history of ICD, history of ventricular tachycardia status post ablation, history of atrial fibrillation on amiodarone therapy.  His last echocardiogram was performed in July of 2010 and revealed an EF of 20-25 and mild MR. Last cath performed in July of 2010 and revealed an occluded RCA but nonobstructive disease in the Lcx and LAD other than ostial 80 in a small marginal; EF 10-15.  He has had CRT and VT ablations. I last saw him in Feb 2012. Since then, the patient has mild dyspnea on exertion but no orthopnea, PND, palpitations, syncope or chest pain. Occasional mild pedal edema. Patient feels that his increased dyspnea may be related to a URI.  Current Outpatient Prescriptions  Medication Sig Dispense Refill  . amiodarone (PACERONE) 200 MG tablet Take 1 tablet AM and 1/2 tablet PM  45 tablet  6  . atorvastatin (LIPITOR) 80 MG tablet Take 80 mg by mouth daily.        . carvedilol (COREG) 12.5 MG tablet TAKE ONE TABLET BY MOUTH TWICE A DAY  60 tablet  3  . furosemide (LASIX) 40 MG tablet Take 40 mg by mouth 2 (two) times daily.        . ramipril (ALTACE) 2.5 MG capsule TAKE 1 CAPSULE BY MOUTH TWICE A DAY  180 capsule  3  . warfarin (COUMADIN) 2.5 MG tablet USE AS DIRECTED BY ANTICOAGULATION CLINIC  30 tablet  2     Past Medical History  Diagnosis Date  . HYPERTHYROIDISM   . HYPERLIPIDEMIA-MIXED   . OBESITY-MORBID (>100')   . CAD   . AV BLOCK, COMPLETE   . VENTRICULAR TACHYCARDIA   . Atrial fibrillation   . CHF   . TRANSAMINASES, SERUM, ELEVATED   . Long term current use of anticoagulant   . Ischemic cardiomyopathy     Past Surgical History  Procedure Date  . Splenectomy   . Status post medtronic concerto crt-d in august 2010 with pocket revision     History   Social History  . Marital Status: Divorced    Spouse Name: N/A    Number of Children: N/A  . Years of  Education: N/A   Occupational History  . Not on file.   Social History Main Topics  . Smoking status: Former Games developer  . Smokeless tobacco: Not on file  . Alcohol Use: Yes  . Drug Use: No  . Sexually Active: Not on file   Other Topics Concern  . Not on file   Social History Narrative  . No narrative on file    ROS: no fevers or chills, productive cough, hemoptysis, dysphasia, odynophagia, melena, hematochezia, dysuria, hematuria, rash, seizure activity, orthopnea, PND,  claudication. Remaining systems are negative.  Physical Exam: Well-developed well-nourished in no acute distress.  Skin is warm and dry.  HEENT is normal.  Neck is supple. No thyromegaly.  Chest is clear to auscultation with normal expansion. ICD in place Cardiovascular exam is regular rate and rhythm.  Abdominal exam nontender or distended. No masses palpated. Incisional hernia Extremities show no edema. neuro grossly intact  ECG AV paced

## 2011-01-06 NOTE — Assessment & Plan Note (Signed)
Goal INR 2-3

## 2011-01-06 NOTE — Assessment & Plan Note (Signed)
Continue statin. Check lipids and liver. 

## 2011-01-06 NOTE — Assessment & Plan Note (Signed)
Continue amiodarone. Ventricular tachycardia and ICD followed by Dr. Graciela Husbands.

## 2011-01-06 NOTE — Patient Instructions (Signed)
Your physician wants you to follow-up in: 6 MONTHS You will receive a reminder letter in the mail two months in advance. If you don't receive a letter, please call our office to schedule the follow-up appointment.   Your physician recommends that you return for lab work in: TODAY  A chest x-ray takes a picture of the organs and structures inside the chest, including the heart, lungs, and blood vessels. This test can show several things, including, whether the heart is enlarges; whether fluid is building up in the lungs; and whether pacemaker / defibrillator leads are still in place. AT [address]  

## 2011-01-06 NOTE — Assessment & Plan Note (Signed)
Continue ACE inhibitor, beta blocker and statin.

## 2011-01-10 ENCOUNTER — Other Ambulatory Visit: Payer: Self-pay | Admitting: *Deleted

## 2011-01-10 MED ORDER — CARVEDILOL 12.5 MG PO TABS: 12.5000 mg | ORAL_TABLET | Freq: Two times a day (BID) | ORAL | Status: DC

## 2011-01-24 LAB — COMPREHENSIVE METABOLIC PANEL
Albumin: 3.5
BUN: 13
Calcium: 8.9
Glucose, Bld: 117 — ABNORMAL HIGH
Potassium: 4.6
Total Protein: 7

## 2011-01-24 LAB — DIFFERENTIAL
Lymphocytes Relative: 32
Lymphs Abs: 2.9
Monocytes Absolute: 1.3 — ABNORMAL HIGH
Monocytes Relative: 14 — ABNORMAL HIGH
Neutro Abs: 4.8
Neutrophils Relative %: 52

## 2011-01-24 LAB — CBC
HCT: 46.9
Hemoglobin: 16.2
MCHC: 34.7
Platelets: 198
RDW: 14.1

## 2011-01-27 ENCOUNTER — Other Ambulatory Visit: Payer: Self-pay | Admitting: Internal Medicine

## 2011-01-27 ENCOUNTER — Ambulatory Visit (INDEPENDENT_AMBULATORY_CARE_PROVIDER_SITE_OTHER): Payer: Medicare Other | Admitting: *Deleted

## 2011-01-27 ENCOUNTER — Encounter: Payer: Self-pay | Admitting: Internal Medicine

## 2011-01-27 DIAGNOSIS — I472 Ventricular tachycardia: Secondary | ICD-10-CM

## 2011-01-27 DIAGNOSIS — I4891 Unspecified atrial fibrillation: Secondary | ICD-10-CM

## 2011-01-27 DIAGNOSIS — I442 Atrioventricular block, complete: Secondary | ICD-10-CM

## 2011-01-27 DIAGNOSIS — I5022 Chronic systolic (congestive) heart failure: Secondary | ICD-10-CM

## 2011-01-28 LAB — REMOTE ICD DEVICE
AL AMPLITUDE: 2.1 mv
ATRIAL PACING ICD: 99.99 pct
LV LEAD IMPEDENCE ICD: 950 Ohm
RV LEAD AMPLITUDE: 18.4 mv
RV LEAD IMPEDENCE ICD: 627 Ohm
TOT-0006: 20100818000000
TZAT-0001ATACH: 3
TZAT-0001FASTVT: 1
TZAT-0001FASTVT: 2
TZAT-0001SLOWVT: 1
TZAT-0001SLOWVT: 2
TZAT-0002ATACH: NEGATIVE
TZAT-0002ATACH: NEGATIVE
TZAT-0004FASTVT: 8
TZAT-0004FASTVT: 8
TZAT-0005FASTVT: 88 pct
TZAT-0005FASTVT: 91 pct
TZAT-0005SLOWVT: 88 pct
TZAT-0005SLOWVT: 91 pct
TZAT-0011FASTVT: 10 ms
TZAT-0011FASTVT: 10 ms
TZAT-0012ATACH: 150 ms
TZAT-0013FASTVT: 2
TZAT-0013SLOWVT: 5
TZAT-0013SLOWVT: 5
TZAT-0018ATACH: NEGATIVE
TZAT-0018SLOWVT: NEGATIVE
TZAT-0018SLOWVT: NEGATIVE
TZAT-0019ATACH: 6 V
TZAT-0019ATACH: 6 V
TZAT-0020ATACH: 1.5 ms
TZON-0004SLOWVT: 100
TZON-0005SLOWVT: 12
TZST-0001ATACH: 5
TZST-0001FASTVT: 3
TZST-0001FASTVT: 6
TZST-0001SLOWVT: 3
TZST-0001SLOWVT: 5
TZST-0002ATACH: NEGATIVE
TZST-0002ATACH: NEGATIVE
TZST-0002SLOWVT: NEGATIVE
TZST-0002SLOWVT: NEGATIVE
TZST-0003FASTVT: 35 J
VF: 0

## 2011-02-02 ENCOUNTER — Encounter: Payer: Medicare Other | Admitting: *Deleted

## 2011-02-03 ENCOUNTER — Ambulatory Visit (INDEPENDENT_AMBULATORY_CARE_PROVIDER_SITE_OTHER): Payer: Medicare Other | Admitting: *Deleted

## 2011-02-03 ENCOUNTER — Encounter: Payer: Self-pay | Admitting: *Deleted

## 2011-02-03 DIAGNOSIS — I4891 Unspecified atrial fibrillation: Secondary | ICD-10-CM

## 2011-02-03 DIAGNOSIS — Z7901 Long term (current) use of anticoagulants: Secondary | ICD-10-CM

## 2011-02-03 LAB — POCT INR: INR: 3.3

## 2011-02-03 NOTE — Progress Notes (Signed)
icd remote w/icm  

## 2011-03-07 ENCOUNTER — Telehealth: Payer: Self-pay | Admitting: *Deleted

## 2011-03-07 ENCOUNTER — Ambulatory Visit (INDEPENDENT_AMBULATORY_CARE_PROVIDER_SITE_OTHER): Payer: Medicare Other | Admitting: *Deleted

## 2011-03-07 DIAGNOSIS — I4891 Unspecified atrial fibrillation: Secondary | ICD-10-CM

## 2011-03-07 DIAGNOSIS — Z7901 Long term (current) use of anticoagulants: Secondary | ICD-10-CM

## 2011-03-07 LAB — POCT INR: INR: 3.2

## 2011-03-07 NOTE — Telephone Encounter (Signed)
LMOM for pt that we do not do home monitoring and is usually not cost effective for pt.

## 2011-03-07 NOTE — Telephone Encounter (Signed)
Patient would like to discuss how he could do his PT/INR at home.  States that it would save him a 3 hr trip. / tg

## 2011-03-11 ENCOUNTER — Other Ambulatory Visit: Payer: Self-pay

## 2011-03-11 MED ORDER — WARFARIN SODIUM 2.5 MG PO TABS: ORAL_TABLET | ORAL | Status: DC

## 2011-04-04 ENCOUNTER — Ambulatory Visit (INDEPENDENT_AMBULATORY_CARE_PROVIDER_SITE_OTHER): Payer: Medicare Other | Admitting: *Deleted

## 2011-04-04 DIAGNOSIS — Z7901 Long term (current) use of anticoagulants: Secondary | ICD-10-CM

## 2011-04-04 DIAGNOSIS — I4891 Unspecified atrial fibrillation: Secondary | ICD-10-CM

## 2011-04-08 ENCOUNTER — Other Ambulatory Visit: Payer: Self-pay | Admitting: Cardiology

## 2011-04-11 ENCOUNTER — Other Ambulatory Visit: Payer: Self-pay | Admitting: Internal Medicine

## 2011-04-11 NOTE — Telephone Encounter (Signed)
Follow up from previous call:  Returning call regarding rx.

## 2011-04-11 NOTE — Telephone Encounter (Signed)
Will route this to Herbert Seta looks as if she will ask Dr. Graciela Husbands regarding this medication issue before refilling

## 2011-04-12 ENCOUNTER — Other Ambulatory Visit: Payer: Self-pay | Admitting: *Deleted

## 2011-04-13 NOTE — Telephone Encounter (Signed)
routing

## 2011-04-13 NOTE — Telephone Encounter (Signed)
I am not sure what is going on with this prescription. It is on his med list from March when he saw Dr. Graciela Husbands. I don't see where we stopped it. Dr. [name] saw him after that. Will ask [employer] to call the patient to follow up and see if he is still taking this.

## 2011-04-14 ENCOUNTER — Other Ambulatory Visit: Payer: Self-pay | Admitting: *Deleted

## 2011-04-14 MED ORDER — SPIRONOLACTONE 25 MG PO TABS: 25.0000 mg | ORAL_TABLET | Freq: Every day | ORAL | Status: DC

## 2011-04-14 NOTE — Telephone Encounter (Signed)
I spoke with pt regarding this medication  Spironolactone 25 Mg Tabs (Spironolactone) .... Take One Tablet By Mouth Daily he states he's taken it even though it was not on med list several nurses looked into this issue Carlyon Shadow and Margaretmary Dys also D Betsey Holiday. Per Foye Spurling give pt this medication and advise pt too bring in med list at every visit to have refills done at a proper and orderly time

## 2011-04-15 ENCOUNTER — Other Ambulatory Visit: Payer: Self-pay | Admitting: *Deleted

## 2011-04-15 MED ORDER — AMIODARONE HCL 200 MG PO TABS: ORAL_TABLET | ORAL | Status: DC

## 2011-04-18 ENCOUNTER — Other Ambulatory Visit: Payer: Self-pay

## 2011-04-18 MED ORDER — AMIODARONE HCL 200 MG PO TABS: ORAL_TABLET | ORAL | Status: DC

## 2011-04-28 ENCOUNTER — Encounter: Payer: Medicare Other | Admitting: *Deleted

## 2011-05-04 ENCOUNTER — Encounter: Payer: Self-pay | Admitting: *Deleted

## 2011-05-05 ENCOUNTER — Ambulatory Visit (INDEPENDENT_AMBULATORY_CARE_PROVIDER_SITE_OTHER): Payer: Self-pay | Admitting: *Deleted

## 2011-05-05 DIAGNOSIS — I4891 Unspecified atrial fibrillation: Secondary | ICD-10-CM

## 2011-05-05 DIAGNOSIS — Z7901 Long term (current) use of anticoagulants: Secondary | ICD-10-CM

## 2011-05-05 LAB — POCT INR: INR: 2.3

## 2011-05-06 IMAGING — CR DG ABDOMEN 1V
2 series · 2 of 2 positions shown · non-contrast
Comparison: CT abdomen pelvis 10/27/2008.

CLINICAL DATA: Abdominal distention.

ABDOMEN - 1 VIEW

[t abdomen supine (1 of 2)]
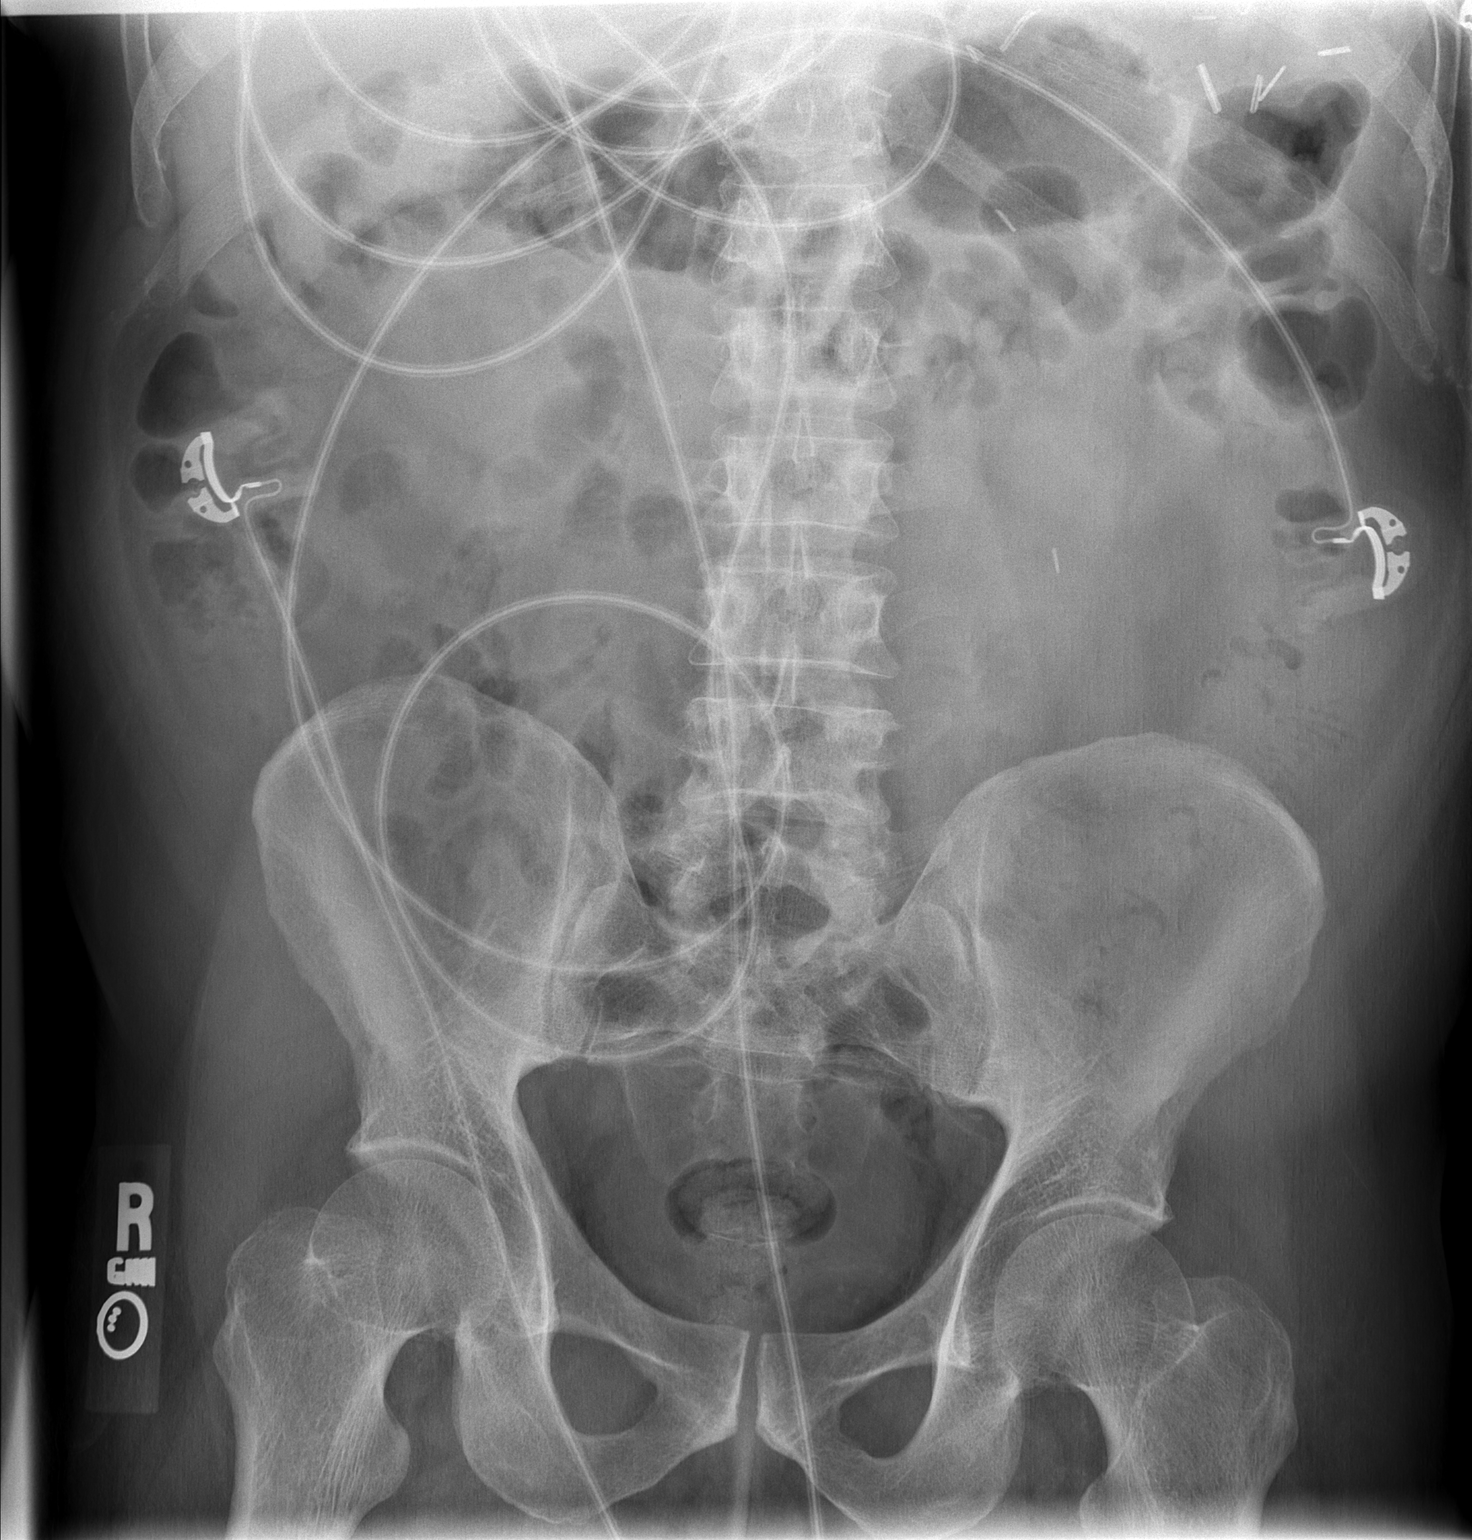

[t abdomen supine (2 of 2)]
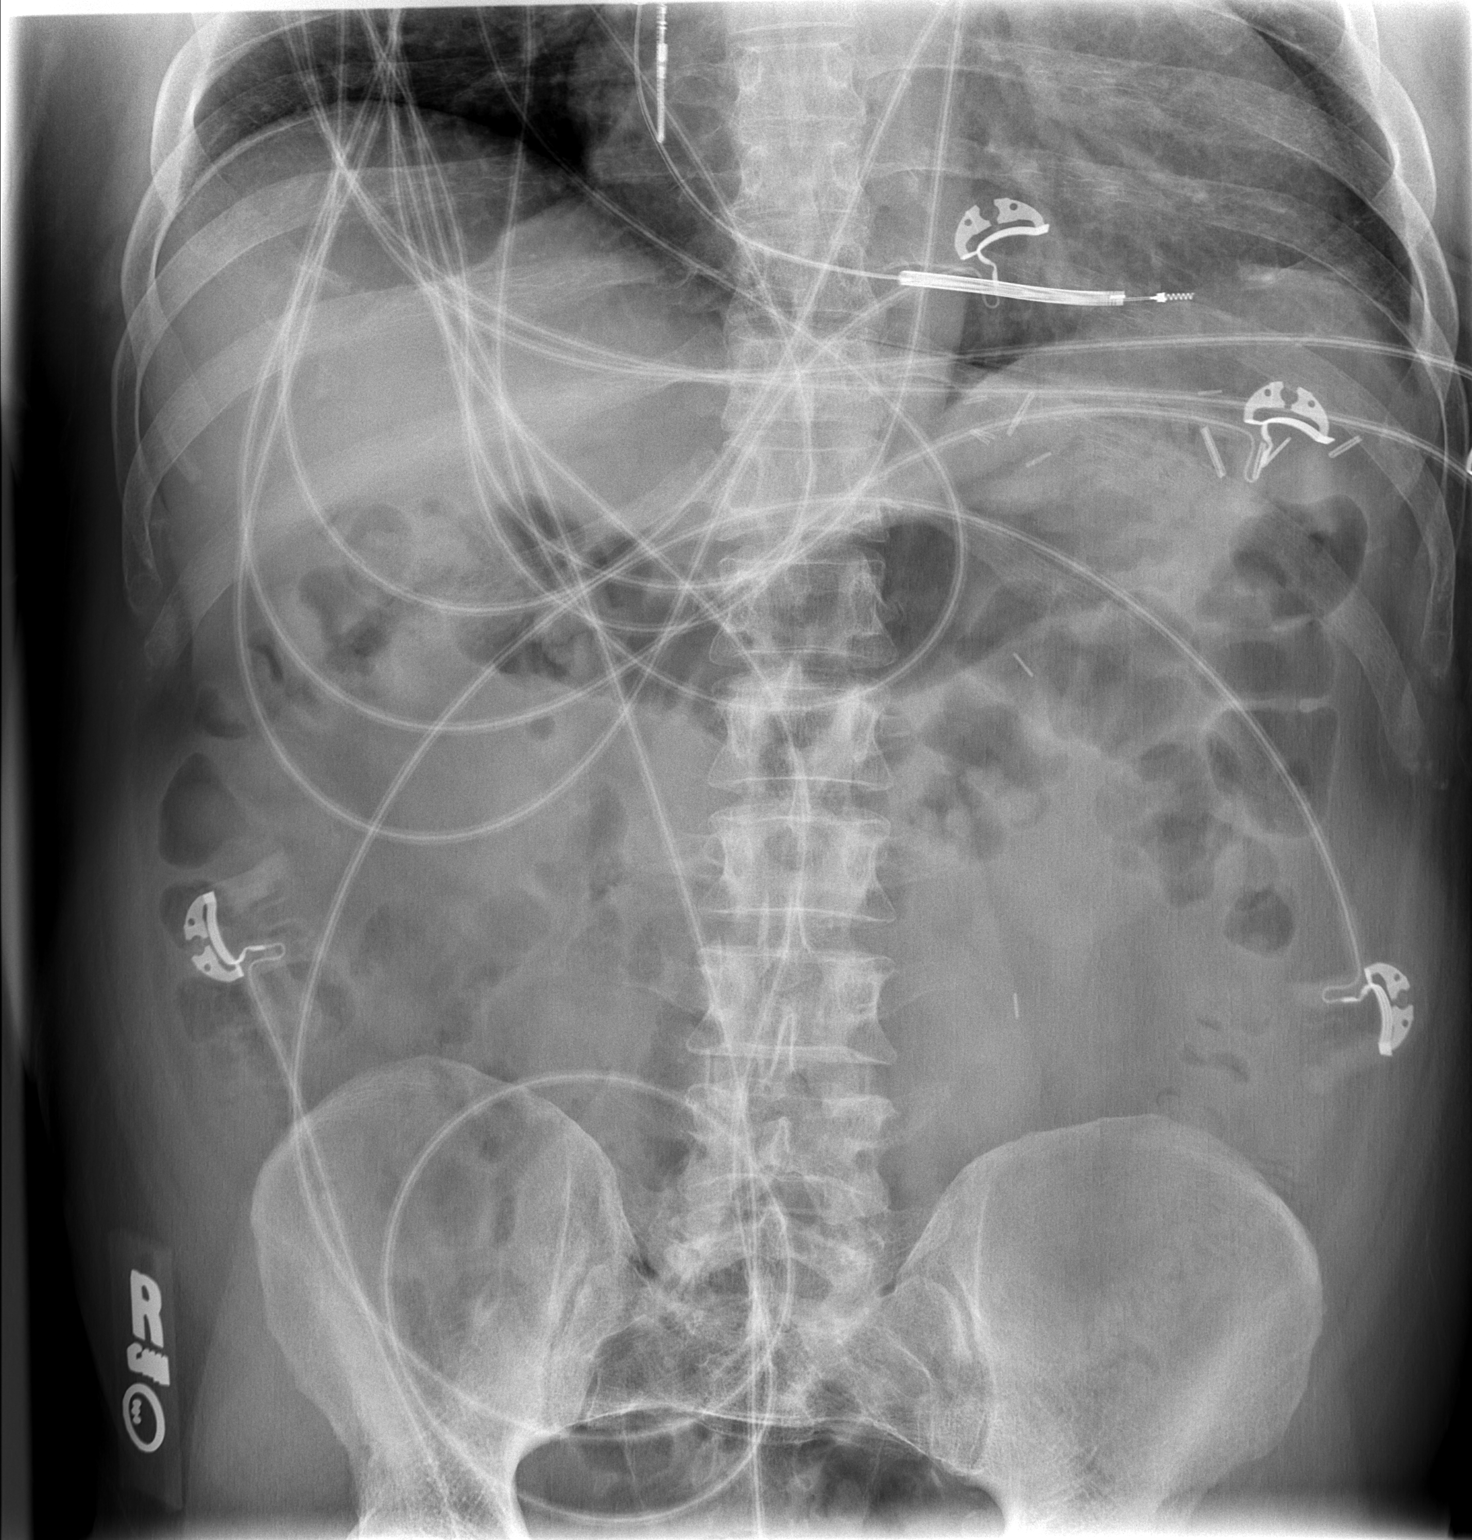

[2 of 2 positions shown; findings below may reference images not displayed]

FINDINGS: There is no free air.  Bowel gas pattern unremarkable.
No unexpected calcifications.  Surgical clips left upper quadrant
noted.
IMPRESSION: No acute finding.

## 2011-05-19 ENCOUNTER — Encounter: Payer: Self-pay | Admitting: Internal Medicine

## 2011-05-19 ENCOUNTER — Ambulatory Visit (INDEPENDENT_AMBULATORY_CARE_PROVIDER_SITE_OTHER): Payer: Self-pay | Admitting: *Deleted

## 2011-05-19 DIAGNOSIS — I442 Atrioventricular block, complete: Secondary | ICD-10-CM

## 2011-05-19 DIAGNOSIS — I4891 Unspecified atrial fibrillation: Secondary | ICD-10-CM

## 2011-05-19 DIAGNOSIS — I5022 Chronic systolic (congestive) heart failure: Secondary | ICD-10-CM

## 2011-05-22 LAB — REMOTE ICD DEVICE
ATRIAL PACING ICD: 99.99 pct
LV LEAD IMPEDENCE ICD: 950 Ohm
PACEART VT: 0
TOT-0006: 20100818000000
TZAT-0001ATACH: 1
TZAT-0001ATACH: 2
TZAT-0001FASTVT: 1
TZAT-0001FASTVT: 2
TZAT-0001SLOWVT: 1
TZAT-0001SLOWVT: 2
TZAT-0002ATACH: NEGATIVE
TZAT-0002ATACH: NEGATIVE
TZAT-0002ATACH: NEGATIVE
TZAT-0004SLOWVT: 8
TZAT-0004SLOWVT: 8
TZAT-0005SLOWVT: 88 pct
TZAT-0005SLOWVT: 91 pct
TZAT-0012FASTVT: 200 ms
TZAT-0013FASTVT: 2
TZAT-0013FASTVT: 2
TZAT-0013SLOWVT: 5
TZAT-0013SLOWVT: 5
TZAT-0018ATACH: NEGATIVE
TZAT-0018ATACH: NEGATIVE
TZAT-0018ATACH: NEGATIVE
TZAT-0018FASTVT: NEGATIVE
TZAT-0018FASTVT: NEGATIVE
TZAT-0019ATACH: 6 V
TZAT-0019ATACH: 6 V
TZAT-0019FASTVT: 8 V
TZAT-0020ATACH: 1.5 ms
TZAT-0020FASTVT: 1.5 ms
TZAT-0020FASTVT: 1.5 ms
TZAT-0020SLOWVT: 1.5 ms
TZAT-0020SLOWVT: 1.5 ms
TZON-0003ATACH: 450 ms
TZON-0003FASTVT: 270 ms
TZON-0003SLOWVT: 630 ms
TZON-0004SLOWVT: 100
TZON-0004VSLOWVT: 20
TZST-0001FASTVT: 3
TZST-0001FASTVT: 5
TZST-0001FASTVT: 6
TZST-0001SLOWVT: 3
TZST-0001SLOWVT: 4
TZST-0001SLOWVT: 5
TZST-0002ATACH: NEGATIVE
TZST-0002ATACH: NEGATIVE
TZST-0002SLOWVT: NEGATIVE
TZST-0002SLOWVT: NEGATIVE
TZST-0002SLOWVT: NEGATIVE
TZST-0003FASTVT: 35 J
TZST-0003FASTVT: 35 J
VENTRICULAR PACING ICD: 100 pct
VF: 0

## 2011-05-24 ENCOUNTER — Encounter: Payer: Self-pay | Admitting: *Deleted

## 2011-05-30 NOTE — Progress Notes (Signed)
Remote icd check w/icm  

## 2011-06-06 ENCOUNTER — Encounter: Payer: Self-pay | Admitting: *Deleted

## 2011-06-07 ENCOUNTER — Other Ambulatory Visit: Payer: Self-pay | Admitting: Cardiology

## 2011-06-09 ENCOUNTER — Encounter: Payer: Self-pay | Admitting: *Deleted

## 2011-06-13 ENCOUNTER — Ambulatory Visit (INDEPENDENT_AMBULATORY_CARE_PROVIDER_SITE_OTHER): Payer: Self-pay | Admitting: *Deleted

## 2011-06-13 DIAGNOSIS — I4891 Unspecified atrial fibrillation: Secondary | ICD-10-CM

## 2011-06-13 DIAGNOSIS — Z7901 Long term (current) use of anticoagulants: Secondary | ICD-10-CM

## 2011-07-08 ENCOUNTER — Ambulatory Visit: Payer: Self-pay | Admitting: Cardiology

## 2011-07-14 ENCOUNTER — Other Ambulatory Visit: Payer: Self-pay | Admitting: Cardiology

## 2011-07-14 ENCOUNTER — Other Ambulatory Visit: Payer: Self-pay

## 2011-07-14 MED ORDER — RAMIPRIL 2.5 MG PO CAPS: 2.5000 mg | ORAL_CAPSULE | Freq: Two times a day (BID) | ORAL | Status: DC

## 2011-07-15 ENCOUNTER — Telehealth: Payer: Self-pay | Admitting: Cardiology

## 2011-07-15 ENCOUNTER — Encounter: Payer: Self-pay | Admitting: *Deleted

## 2011-07-15 NOTE — Telephone Encounter (Signed)
Will forward for dr crenshaw review  

## 2011-07-15 NOTE — Telephone Encounter (Signed)
Pt having a EGD on 08/19/11 at [facility] and pt needs to be off coumadin for 5 days prior to surgery date

## 2011-07-15 NOTE — Telephone Encounter (Signed)
Dr crenshaw's recommendations faxed to the number provided. 

## 2011-07-15 NOTE — Telephone Encounter (Signed)
Dc coumadin 4 days prior to procedure and resume after at same dose when ok with GI. Olga Millers

## 2011-08-01 ENCOUNTER — Telehealth: Payer: Self-pay | Admitting: Internal Medicine

## 2011-08-01 NOTE — Telephone Encounter (Signed)
AFTER REVIEWING  LAST OFFICE NOTE  APPEARS PT DOES NOT REQUIRE ANTIBIOTIC THERAPY PRIOR TO TEETH EXTRACTIONS,   PER PT HAS TAKEN   ANTIBIOTIC IN PAST, PT AWARE ,WILL FORWARD TO DR Graciela Husbands FOR REVIEW .Zack Seal

## 2011-08-01 NOTE — Telephone Encounter (Signed)
i dont see an indictation for antibiotics Thank steve

## 2011-08-01 NOTE — Telephone Encounter (Signed)
New msg Pt needs to have some teeth pulled and he wanted to know does he need to take antibiotics. Please call

## 2011-08-02 NOTE — Telephone Encounter (Signed)
PT AWARE DOES NOT NEED ANTIBIOTICS  PRIOR TO TEETH EXTRACTIONS./CY

## 2011-08-04 ENCOUNTER — Ambulatory Visit (INDEPENDENT_AMBULATORY_CARE_PROVIDER_SITE_OTHER): Payer: Medicare Other | Admitting: *Deleted

## 2011-08-04 DIAGNOSIS — I4891 Unspecified atrial fibrillation: Secondary | ICD-10-CM

## 2011-08-04 DIAGNOSIS — Z7901 Long term (current) use of anticoagulants: Secondary | ICD-10-CM

## 2011-08-04 LAB — POCT INR: INR: 1.9

## 2011-08-08 ENCOUNTER — Ambulatory Visit (INDEPENDENT_AMBULATORY_CARE_PROVIDER_SITE_OTHER)
Admission: RE | Admit: 2011-08-08 | Discharge: 2011-08-08 | Disposition: A | Discharge status: Home / Self Care | Payer: Medicare Other | Source: Ambulatory Visit | Attending: Cardiology | Admitting: Cardiology

## 2011-08-08 ENCOUNTER — Encounter: Payer: Self-pay | Admitting: *Deleted

## 2011-08-08 ENCOUNTER — Ambulatory Visit (INDEPENDENT_AMBULATORY_CARE_PROVIDER_SITE_OTHER): Payer: Medicare Other | Admitting: Cardiology

## 2011-08-08 ENCOUNTER — Encounter: Payer: Self-pay | Admitting: Cardiology

## 2011-08-08 VITALS — BP 118/78 | HR 77 | Ht 62.0 in | Wt 177.0 lb

## 2011-08-08 DIAGNOSIS — I428 Other cardiomyopathies: Secondary | ICD-10-CM

## 2011-08-08 DIAGNOSIS — I5022 Chronic systolic (congestive) heart failure: Secondary | ICD-10-CM

## 2011-08-08 DIAGNOSIS — I4891 Unspecified atrial fibrillation: Secondary | ICD-10-CM

## 2011-08-08 DIAGNOSIS — I2510 Atherosclerotic heart disease of native coronary artery without angina pectoris: Secondary | ICD-10-CM

## 2011-08-08 LAB — CBC WITH DIFFERENTIAL/PLATELET
Basophils Absolute: 0.1 10*3/uL (ref 0.0–0.1)
Basophils Relative: 0.5 % (ref 0.0–3.0)
Eosinophils Absolute: 0.1 10*3/uL (ref 0.0–0.7)
Lymphocytes Relative: 17.3 % (ref 12.0–46.0)
MCHC: 33.1 g/dL (ref 30.0–36.0)
Neutrophils Relative %: 71.7 % (ref 43.0–77.0)
Platelets: 188 10*3/uL (ref 150.0–400.0)
RBC: 4.36 Mil/uL (ref 4.22–5.81)
RDW: 15.2 % — ABNORMAL HIGH (ref 11.5–14.6)

## 2011-08-08 LAB — TSH: TSH: 1.98 u[IU]/mL (ref 0.35–5.50)

## 2011-08-08 LAB — BASIC METABOLIC PANEL
BUN: 18 mg/dL (ref 6–23)
CO2: 27 mEq/L (ref 19–32)
Calcium: 8.8 mg/dL (ref 8.4–10.5)
Creatinine, Ser: 1.2 mg/dL (ref 0.4–1.5)

## 2011-08-08 LAB — LDL CHOLESTEROL, DIRECT: Direct LDL: 96.7 mg/dL

## 2011-08-08 LAB — LIPID PANEL
Cholesterol: 161 mg/dL (ref 0–200)
Triglycerides: 206 mg/dL — ABNORMAL HIGH (ref 0.0–149.0)

## 2011-08-08 LAB — HEPATIC FUNCTION PANEL
ALT: 42 U/L (ref 0–53)
AST: 34 U/L (ref 0–37)
Albumin: 3.5 g/dL (ref 3.5–5.2)
Alkaline Phosphatase: 75 U/L (ref 39–117)
Total Bilirubin: 0.9 mg/dL (ref 0.3–1.2)

## 2011-08-08 NOTE — Assessment & Plan Note (Signed)
Patient remains in an AV paced rhythm. Continue amiodarone. Continue Coumadin with goal INR 2-3. Check hemoglobin.

## 2011-08-08 NOTE — Assessment & Plan Note (Signed)
Continue beta blocker and ACE inhibitor. Severe LV dysfunction.

## 2011-08-08 NOTE — Assessment & Plan Note (Signed)
Euvolemic on examination. Continue present dose of Lasix. Check potassium and renal function. 

## 2011-08-08 NOTE — Assessment & Plan Note (Signed)
Continue statin. Not on aspirin given need for Coumadin. Schedule Lexiscan Myoview for risk stratification.

## 2011-08-08 NOTE — Progress Notes (Signed)
HPI: Michael Krueger is a pleasant gentleman with a history of coronary artery disease, ischemic cardiomyopathy, history of ICD, history of ventricular tachycardia status post ablation, history of atrial fibrillation on amiodarone therapy. His last echocardiogram was performed in July of 2010 and revealed an EF of 20-25 and mild MR. Last cath performed in July of 2010 and revealed an occluded RCA but nonobstructive disease in the Lcx and LAD other than ostial 80 in a small marginal; EF 10-15. He has had CRT and VT ablations. I last saw him in Sept 2012. Since then, the patient has dyspnea with more extreme activities but not with routine activities. It is relieved with rest. It is not associated with chest pain. There is no orthopnea, PND or pedal edema. There is no syncope or palpitations. There is no exertional chest pain.   Current Outpatient Prescriptions  Medication Sig Dispense Refill  . amiodarone (PACERONE) 200 MG tablet Take 1 tablet AM and 1/2 tablet PM  45 tablet  6  . atorvastatin (LIPITOR) 80 MG tablet Take 80 mg by mouth daily.        . carvedilol (COREG) 12.5 MG tablet TAKE ONE TABLET BY MOUTH TWICE A DAY  60 tablet  3  . digoxin (LANOXIN) 0.125 MG tablet TAKE 1 TABLET BY MOUTH EVERY DAY  30 tablet  9  . furosemide (LASIX) 40 MG tablet Take 40 mg by mouth 2 (two) times daily.        [name] oxyCODONE-acetaminophen (PERCOCET) 5-325 MG per tablet Take 1 tablet by mouth every 4 (four) hours as needed.      . pantoprazole (PROTONIX) 40 MG tablet Take 1 tablet by mouth Daily.      . penicillin v potassium (VEETID) 500 MG tablet Take 500 mg by mouth as needed.      . ramipril (ALTACE) 2.5 MG capsule Take 1 capsule (2.5 mg total) by mouth 2 (two) times daily.  120 capsule  2  . spironolactone (ALDACTONE) 25 MG tablet TAKE 1 TABLET BY MOUTH DAILY  30 tablet  11  . warfarin (COUMADIN) 2.5 MG tablet Take as directed by anticoagulation clinic  30 tablet  3  . DISCONTD: spironolactone (ALDACTONE) 25  MG tablet Take 1 tablet (25 mg total) by mouth daily.  30 tablet  12     Past Medical History  Diagnosis Date  . HYPERTHYROIDISM   . HYPERLIPIDEMIA-MIXED   . OBESITY-MORBID (>100')   . CAD   . AV BLOCK, COMPLETE   . VENTRICULAR TACHYCARDIA   . Atrial fibrillation   . CHF   . TRANSAMINASES, SERUM, ELEVATED   . Long term current use of anticoagulant   . Ischemic cardiomyopathy     Past Surgical History  Procedure Date  . Splenectomy   . Status post medtronic concerto crt-d in august 2010 with pocket revision     History   Social History  . Marital Status: Divorced    Spouse Name: N/A    Number of Children: N/A  . Years of Education: N/A   Occupational History  . Not on file.   Social History Main Topics  . Smoking status: Former Games developer  . Smokeless tobacco: Not on file  . Alcohol Use: Yes  . Drug Use: No  . Sexually Active: Not on file   Other Topics Concern  . Not on file   Social History Narrative  . No narrative on file    ROS: no fevers or chills, productive cough, hemoptysis,  dysphasia, odynophagia, melena, hematochezia, dysuria, hematuria, rash, seizure activity, orthopnea, PND, pedal edema, claudication. Remaining systems are negative.  Physical Exam: Well-developed well-nourished in no acute distress.  Skin is warm and dry.  HEENT is normal.  Neck is supple. No thyromegaly.  Chest is clear to auscultation with normal expansion.  Cardiovascular exam is regular rate and rhythm.  Abdominal exam nontender or distended. No masses palpated. Extremities show no edema. neuro grossly intact  ECG AV sequential pacing

## 2011-08-08 NOTE — Patient Instructions (Signed)
Your physician wants you to follow-up in: 6 MONTHS You will receive a reminder letter in the mail two months in advance. If you don't receive a letter, please call our office to schedule the follow-up appointment.   Your physician has requested that you have a lexiscan myoview. For further information please visit [email or web address] Please follow instruction sheet, as given.   A chest x-ray takes a picture of the organs and structures inside the chest, including the heart, lungs, and blood vessels. This test can show several things, including, whether the heart is enlarges; whether fluid is building up in the lungs; and whether pacemaker / defibrillator leads are still in place. AT [address]  Your physician recommends that you HAVE LABS TODAY

## 2011-08-08 NOTE — Assessment & Plan Note (Signed)
Continue statin. Check lipids and liver. 

## 2011-08-08 NOTE — Assessment & Plan Note (Signed)
Continue amiodarone. Patient on 300 mg daily. He will review this with Dr. Graciela Husbands at next visit. Check chest x-ray, TSH and liver functions.

## 2011-08-15 ENCOUNTER — Ambulatory Visit (HOSPITAL_COMMUNITY): Payer: Medicare Other | Attending: Internal Medicine | Admitting: Radiology

## 2011-08-15 VITALS — BP 102/66 | Ht 62.0 in | Wt 176.0 lb

## 2011-08-15 DIAGNOSIS — Z95810 Presence of automatic (implantable) cardiac defibrillator: Secondary | ICD-10-CM | POA: Insufficient documentation

## 2011-08-15 DIAGNOSIS — E785 Hyperlipidemia, unspecified: Secondary | ICD-10-CM | POA: Insufficient documentation

## 2011-08-15 DIAGNOSIS — R0609 Other forms of dyspnea: Secondary | ICD-10-CM | POA: Insufficient documentation

## 2011-08-15 DIAGNOSIS — Z87891 Personal history of nicotine dependence: Secondary | ICD-10-CM | POA: Insufficient documentation

## 2011-08-15 DIAGNOSIS — I4891 Unspecified atrial fibrillation: Secondary | ICD-10-CM | POA: Insufficient documentation

## 2011-08-15 DIAGNOSIS — R0602 Shortness of breath: Secondary | ICD-10-CM

## 2011-08-15 DIAGNOSIS — I509 Heart failure, unspecified: Secondary | ICD-10-CM | POA: Insufficient documentation

## 2011-08-15 DIAGNOSIS — I2510 Atherosclerotic heart disease of native coronary artery without angina pectoris: Secondary | ICD-10-CM

## 2011-08-15 DIAGNOSIS — R0989 Other specified symptoms and signs involving the circulatory and respiratory systems: Secondary | ICD-10-CM | POA: Insufficient documentation

## 2011-08-15 DIAGNOSIS — I252 Old myocardial infarction: Secondary | ICD-10-CM | POA: Insufficient documentation

## 2011-08-15 MED ORDER — TECHNETIUM TC 99M TETROFOSMIN IV KIT
10.0000 | PACK | Freq: Once | INTRAVENOUS | Status: AC | PRN
  Administered 2011-08-15: 10 via INTRAVENOUS

## 2011-08-15 MED ORDER — TECHNETIUM TC 99M TETROFOSMIN IV KIT
30.0000 | PACK | Freq: Once | INTRAVENOUS | Status: AC | PRN
  Administered 2011-08-15: 30 via INTRAVENOUS

## 2011-08-15 MED ORDER — ADENOSINE (DIAGNOSTIC) 3 MG/ML IV SOLN
0.5600 mg/kg | Freq: Once | INTRAVENOUS | Status: AC
  Administered 2011-08-15: 44.7 mg via INTRAVENOUS

## 2011-08-15 NOTE — Progress Notes (Signed)
[facility] SITE 3 NUCLEAR MED [address] [place] [address] [phone]  Cardiology Nuclear Med Study  Michael Krueger is a 56 y.o. male     MRN : [id number]     DOB: 1956-01-02  Procedure Date: 08/15/2011  Nuclear Med Background Indication for Stress Test:  Evaluation for Ischemia History:  CHF,Afib, H/O [facility], 2000: Defibrilator, 2001: MI:-Stents: LAD/CFX, 2009: MPS: (-) ischemia EF: 19% 2010 and 2006: Ablation for VT, Heart Cath: EF: 10-15% patent LAD and CFX stents total occlusion RCA with Collaterals, 80% OM RX  Tx.  Cardiac Risk Factors: History of Smoking and Lipids  Symptoms:  DOE   Nuclear Pre-Procedure Caffeine/Decaff Intake:  None NPO After: 8:00am   Lungs:  clear O2 Sat: 96% on room air. IV 0.9% NS with Angio Cath:  22g  IV Site: R Forearm  IV Started by:  Stanton Kidney, EMT-P  Chest Size (in):  42 Cup Size: n/a  Height: 5\' 2"  (1.575 m)  Weight:  176 lb (79.833 kg)  BMI:  Body mass index is 32.19 kg/(m^2). Tech Comments:  Meds taken as directed at 9:00 am, per patient.    Nuclear Med Study 1 or 2 day study: 1 day  Stress Test Type:  Adenosine  Reading MD: Dietrich Pates, MD  Order Authorizing Provider:  Olga Millers MD  Resting Radionuclide: Technetium 1m Tetrofosmin  Resting Radionuclide Dose: 10.9 mCi   Stress Radionuclide:  Technetium 63m Tetrofosmin  Stress Radionuclide Dose: 33.0 mCi           Stress Protocol Rest HR: 62 Stress HR: 62  Rest BP: 102/66 Stress BP: 114/73  Exercise Time (min): n/a METS: n/a   Predicted Max HR: 165 bpm % Max HR: 37.58 bpm Rate Pressure Product: 7068   Dose of Adenosine (mg):  44.8 Dose of Lexiscan: n/a mg  Dose of Atropine (mg): n/a Dose of Dobutamine: n/a mcg/kg/min (at max HR)  Stress Test Technologist: Milana Na, EMT-P  Nuclear Technologist:  Domenic Polite, CNMT     Rest Procedure:  Myocardial perfusion imaging was performed at rest 45 minutes following the intravenous  administration of Technetium 36m Tetrofosmin. Rest ECG: A/V Pacing  Stress Procedure:  The patient received IV Lexiscan 0.4 mg over 15-seconds.  Technetium 74m Tetrofosmin injected at 30-seconds.  There were non diagnostic changes 2nd to A/V Pacing with Lexiscan.  Quantitative spect images were obtained after a 45 minute delay. Stress ECG:   QPS Raw Data Images:  Soft tissue (diaphrapm, bowel activity) underlies inferior wall. Stress Images:  Large defect in the inferior wall (base, mid, distal), anterior wall (mid, distal), anterolateral wall (distal) anteroseptal (base, mid, distal)inferoseptal(base, mid, distal), inferolateral wall (base) and apex.   Rest Images:  No significant change from the stress images. Subtraction (SDS):  No regional significant ischemia. Transient Ischemic Dilatation (Normal <1.22):  1.01 Lung/Heart Ratio (Normal <0.45):  0.42  Quantitative Gated Spect Images QGS EDV:  358 ml QGS ESV:  308 ml  Impression Exercise Capacity:  Lexiscan with no exercise. BP Response:  Normal blood pressure response. Clinical Symptoms:  No significant symptoms noted. ECG Impression:  Nondiagnostic because of pacing activity. Comparison with Prior Nuclear Study: No significant change from previous scan.  Overall Impression:  Large area of scar in the anterior, anteroseptal, inferoseptal, inferior, anterolateral and apical distributions.  No signif ischemia.  LV Ejection Fraction: 14%.  LV Wall Motion:  Severe diffuse hypokinesis, inferior, apical akinesis. Dietrich Pates

## 2011-08-24 ENCOUNTER — Ambulatory Visit (INDEPENDENT_AMBULATORY_CARE_PROVIDER_SITE_OTHER): Payer: Medicare Other | Admitting: Internal Medicine

## 2011-08-24 ENCOUNTER — Encounter: Payer: Self-pay | Admitting: Internal Medicine

## 2011-08-24 VITALS — BP 108/73 | HR 75 | Ht 62.0 in | Wt 176.8 lb

## 2011-08-24 DIAGNOSIS — I472 Ventricular tachycardia, unspecified: Secondary | ICD-10-CM

## 2011-08-24 DIAGNOSIS — I255 Ischemic cardiomyopathy: Secondary | ICD-10-CM | POA: Insufficient documentation

## 2011-08-24 DIAGNOSIS — I442 Atrioventricular block, complete: Secondary | ICD-10-CM

## 2011-08-24 DIAGNOSIS — I259 Chronic ischemic heart disease, unspecified: Secondary | ICD-10-CM

## 2011-08-24 DIAGNOSIS — I4891 Unspecified atrial fibrillation: Secondary | ICD-10-CM

## 2011-08-24 DIAGNOSIS — I5022 Chronic systolic (congestive) heart failure: Secondary | ICD-10-CM

## 2011-08-24 DIAGNOSIS — Z95810 Presence of automatic (implantable) cardiac defibrillator: Secondary | ICD-10-CM

## 2011-08-24 DIAGNOSIS — I2589 Other forms of chronic ischemic heart disease: Secondary | ICD-10-CM

## 2011-08-24 LAB — ICD DEVICE OBSERVATION
AL IMPEDENCE ICD: 494 Ohm
BAMS-0001: 135 {beats}/min
BATTERY VOLTAGE: 3.02 V
LV LEAD IMPEDENCE ICD: 950 Ohm
LV LEAD THRESHOLD: 1.25 V
RV LEAD THRESHOLD: 1 V
TZAT-0001ATACH: 1
TZAT-0001ATACH: 3
TZAT-0001FASTVT: 1
TZAT-0004FASTVT: 8
TZAT-0004FASTVT: 8
TZAT-0004SLOWVT: 8
TZAT-0004SLOWVT: 8
TZAT-0005FASTVT: 88 pct
TZAT-0005FASTVT: 91 pct
TZAT-0011FASTVT: 10 ms
TZAT-0011FASTVT: 10 ms
TZAT-0011SLOWVT: 10 ms
TZAT-0011SLOWVT: 10 ms
TZAT-0012ATACH: 150 ms
TZAT-0012ATACH: 150 ms
TZAT-0012FASTVT: 200 ms
TZAT-0012FASTVT: 200 ms
TZAT-0012SLOWVT: 200 ms
TZAT-0012SLOWVT: 200 ms
TZAT-0013FASTVT: 2
TZAT-0018ATACH: NEGATIVE
TZAT-0018ATACH: NEGATIVE
TZAT-0018FASTVT: NEGATIVE
TZAT-0019FASTVT: 8 V
TZAT-0019SLOWVT: 8 V
TZAT-0020ATACH: 1.5 ms
TZAT-0020ATACH: 1.5 ms
TZAT-0020FASTVT: 1.5 ms
TZON-0003ATACH: 450 ms
TZON-0003FASTVT: 270 ms
TZON-0003SLOWVT: 630 ms
TZST-0001ATACH: 4
TZST-0001ATACH: 6
TZST-0001FASTVT: 3
TZST-0001FASTVT: 5
TZST-0001SLOWVT: 4
TZST-0001SLOWVT: 5
TZST-0001SLOWVT: 6
TZST-0002ATACH: NEGATIVE
TZST-0002SLOWVT: NEGATIVE
TZST-0002SLOWVT: NEGATIVE
TZST-0003FASTVT: 30 J
TZST-0003FASTVT: 35 J

## 2011-08-24 NOTE — Assessment & Plan Note (Signed)
No intercurrent A. fib 

## 2011-08-24 NOTE — Patient Instructions (Addendum)
Your physician recommends that you schedule a follow-up appointment in: 3 months with Dr. Graciela Husbands.  Your physician has recommended you make the following change in your medication:  1) hold spironolactone (aldactone) until you follow up with Dr. Graciela Husbands.

## 2011-08-24 NOTE — Progress Notes (Signed)
HPI  Michael Krueger is a 56 y.o. male seen in followup for ventricular tachycardia status post ablation. He also has a history of atrial fibrillation and he takes amiodarone for the combination of the 2.He is status post a CRT-D implantation for congestive heart failure in the setting of ischemic heart cardiomyopathy  His last echocardiogram was performed in July of 2010 and revealed an EF of 20-25 and mild MR.  Last cath performed in July of 2010 and revealed an occluded RCA but nonobstructive disease in the Lcx and LAD other than ostial 80 in a small marginal; EF 10-15. He has had CRT and VT ablations  last lipids were February 2012 HDL 33, LDL 85. Amiodarone surveillance laboratories were norma April 2013  He is doing well without complaints of chest pain or shortness of breath. He has noticed some fullness around his defibrillator site. There have been no fevers or chills.  He is had no intercurrent ventricular tachycardia  Past Medical History  Diagnosis Date  . HYPERTHYROIDISM   . HYPERLIPIDEMIA-MIXED   . OBESITY-MORBID (>100')   . CAD   . AV BLOCK, COMPLETE   . VENTRICULAR TACHYCARDIA   . Atrial fibrillation   . CHF   . TRANSAMINASES, SERUM, ELEVATED   . Long term current use of anticoagulant   . Ischemic cardiomyopathy     Past Surgical History  Procedure Date  . Splenectomy   . Status post medtronic concerto crt-d in august 2010 with pocket revision     Current Outpatient Prescriptions  Medication Sig Dispense Refill  . amiodarone (PACERONE) 200 MG tablet Take 1 tablet AM and 1/2 tablet PM  45 tablet  6  . atorvastatin (LIPITOR) 80 MG tablet Take 80 mg by mouth daily.        . carvedilol (COREG) 12.5 MG tablet TAKE ONE TABLET BY MOUTH TWICE A DAY  60 tablet  3  . digoxin (LANOXIN) 0.125 MG tablet TAKE 1 TABLET BY MOUTH EVERY DAY  30 tablet  9  . furosemide (LASIX) 40 MG tablet Take 40 mg by mouth 2 (two) times daily.        [name] oxyCODONE-acetaminophen (PERCOCET)  5-325 MG per tablet Take 1 tablet by mouth every 4 (four) hours as needed.      . pantoprazole (PROTONIX) 40 MG tablet Take 1 tablet by mouth Daily.      . penicillin v potassium (VEETID) 500 MG tablet Take 500 mg by mouth as needed.      . ramipril (ALTACE) 2.5 MG capsule Take 1 capsule (2.5 mg total) by mouth 2 (two) times daily.  120 capsule  2  . spironolactone (ALDACTONE) 25 MG tablet TAKE 1 TABLET BY MOUTH DAILY  30 tablet  11  . warfarin (COUMADIN) 2.5 MG tablet Take as directed by anticoagulation clinic  30 tablet  3    No Known Allergies  Review of Systems negative except from HPI and PMH  Physical Exam BP 108/73  Pulse 75  Ht 5\' 2"  (1.575 m)  Wt 176 lb 12.8 oz (80.196 kg)  BMI 32.34 kg/m2 Well developed and well nourished in no acute distress HENT normal E scleral and icterus clear Neck Supple Breasts are large bilaterally. There is some fullness over his ICD but no fluid or warmth. JVP flat; carotids brisk and full Clear to ausculation Regular rate and rhythm, no murmurs gallops or rub Soft with active bowel sounds No clubbing cyanosis none Edema Alert and oriented, grossly normal motor and  sensory function Skin Warm and Dry   Assessment and  Plan A

## 2011-08-24 NOTE — Assessment & Plan Note (Signed)
Stable. The fullness over his defibrillator pocket may represent gynecomastia. We will hold his spiral lactone and reassess in 3 months.

## 2011-08-24 NOTE — Assessment & Plan Note (Signed)
The patient's device was interrogated.  The information was reviewed. No changes were made in the programming.   His pocket was examined. He has had multiple interventions. I don't think however that is infected. We'll have to keep an eye on it.

## 2011-08-24 NOTE — Assessment & Plan Note (Signed)
No intercurrent Ventricular tachycardia  we discussed trying to down titrate his amiodarone. He is not inclined as he is stable without ventricular tachycardia. I've asked him to consider also went from 300 mg daily to 300 mg alternating with 200 mg when we review the situation in 3 months

## 2011-08-30 ENCOUNTER — Other Ambulatory Visit: Payer: Self-pay | Admitting: Cardiology

## 2011-09-05 ENCOUNTER — Ambulatory Visit (INDEPENDENT_AMBULATORY_CARE_PROVIDER_SITE_OTHER): Payer: Medicare Other | Admitting: *Deleted

## 2011-09-05 DIAGNOSIS — Z7901 Long term (current) use of anticoagulants: Secondary | ICD-10-CM

## 2011-09-05 DIAGNOSIS — I4891 Unspecified atrial fibrillation: Secondary | ICD-10-CM

## 2011-10-04 ENCOUNTER — Other Ambulatory Visit: Payer: Self-pay | Admitting: *Deleted

## 2011-10-04 MED ORDER — WARFARIN SODIUM 2.5 MG PO TABS: ORAL_TABLET | ORAL | Status: DC

## 2011-10-05 ENCOUNTER — Ambulatory Visit (INDEPENDENT_AMBULATORY_CARE_PROVIDER_SITE_OTHER): Payer: Medicare Other | Admitting: *Deleted

## 2011-10-05 DIAGNOSIS — I4891 Unspecified atrial fibrillation: Secondary | ICD-10-CM

## 2011-10-05 DIAGNOSIS — Z7901 Long term (current) use of anticoagulants: Secondary | ICD-10-CM

## 2011-10-23 ENCOUNTER — Other Ambulatory Visit: Payer: Self-pay | Admitting: Cardiology

## 2011-11-02 ENCOUNTER — Ambulatory Visit (INDEPENDENT_AMBULATORY_CARE_PROVIDER_SITE_OTHER): Payer: Medicare Other | Admitting: *Deleted

## 2011-11-02 DIAGNOSIS — I4891 Unspecified atrial fibrillation: Secondary | ICD-10-CM

## 2011-11-02 DIAGNOSIS — Z7901 Long term (current) use of anticoagulants: Secondary | ICD-10-CM

## 2011-12-05 ENCOUNTER — Ambulatory Visit (INDEPENDENT_AMBULATORY_CARE_PROVIDER_SITE_OTHER): Payer: Medicare Other | Admitting: *Deleted

## 2011-12-05 DIAGNOSIS — Z7901 Long term (current) use of anticoagulants: Secondary | ICD-10-CM

## 2011-12-05 DIAGNOSIS — I4891 Unspecified atrial fibrillation: Secondary | ICD-10-CM

## 2011-12-13 ENCOUNTER — Encounter: Payer: Self-pay | Admitting: Internal Medicine

## 2011-12-13 ENCOUNTER — Ambulatory Visit (INDEPENDENT_AMBULATORY_CARE_PROVIDER_SITE_OTHER): Payer: Medicare Other | Admitting: Internal Medicine

## 2011-12-13 VITALS — BP 120/79 | HR 76 | Ht 62.0 in | Wt 178.0 lb

## 2011-12-13 DIAGNOSIS — I472 Ventricular tachycardia: Secondary | ICD-10-CM

## 2011-12-13 DIAGNOSIS — I5022 Chronic systolic (congestive) heart failure: Secondary | ICD-10-CM

## 2011-12-13 DIAGNOSIS — I4891 Unspecified atrial fibrillation: Secondary | ICD-10-CM

## 2011-12-13 DIAGNOSIS — I442 Atrioventricular block, complete: Secondary | ICD-10-CM

## 2011-12-13 DIAGNOSIS — Z95810 Presence of automatic (implantable) cardiac defibrillator: Secondary | ICD-10-CM

## 2011-12-13 LAB — ICD DEVICE OBSERVATION
AL IMPEDENCE ICD: 475 Ohm
AL THRESHOLD: 1.25 V
ATRIAL PACING ICD: 100 pct
BAMS-0001: 135 {beats}/min
LV LEAD THRESHOLD: 1 V
RV LEAD THRESHOLD: 1 V
TZAT-0001ATACH: 1
TZAT-0001ATACH: 2
TZAT-0001ATACH: 3
TZAT-0002ATACH: NEGATIVE
TZAT-0004FASTVT: 8
TZAT-0004FASTVT: 8
TZAT-0004SLOWVT: 8
TZAT-0005FASTVT: 88 pct
TZAT-0005FASTVT: 91 pct
TZAT-0012ATACH: 150 ms
TZAT-0012FASTVT: 200 ms
TZAT-0012FASTVT: 200 ms
TZAT-0012SLOWVT: 200 ms
TZAT-0012SLOWVT: 200 ms
TZAT-0013SLOWVT: 5
TZAT-0013SLOWVT: 5
TZAT-0018ATACH: NEGATIVE
TZAT-0018ATACH: NEGATIVE
TZAT-0018SLOWVT: NEGATIVE
TZAT-0019ATACH: 6 V
TZAT-0019SLOWVT: 8 V
TZAT-0019SLOWVT: 8 V
TZAT-0020FASTVT: 1.5 ms
TZAT-0020FASTVT: 1.5 ms
TZAT-0020SLOWVT: 1.5 ms
TZAT-0020SLOWVT: 1.5 ms
TZON-0003SLOWVT: 630 ms
TZON-0004SLOWVT: 100
TZON-0004VSLOWVT: 20
TZON-0005SLOWVT: 12
TZST-0001ATACH: 5
TZST-0001ATACH: 6
TZST-0001FASTVT: 3
TZST-0001FASTVT: 5
TZST-0001SLOWVT: 6
TZST-0002ATACH: NEGATIVE
TZST-0002ATACH: NEGATIVE
TZST-0002ATACH: NEGATIVE
TZST-0002SLOWVT: NEGATIVE
TZST-0002SLOWVT: NEGATIVE
TZST-0003FASTVT: 30 J

## 2011-12-13 NOTE — Patient Instructions (Addendum)
Remote monitoring is used to monitor your Pacemaker of ICD from home. This monitoring reduces the number of office visits required to check your device to one time per year. It allows [place] to keep an eye on the functioning of your device to ensure it is working properly. You are scheduled for a device check from home on March 19, 2012. You may send your transmission at any time that day. If you have a wireless device, the transmission will be sent automatically. After your physician reviews your transmission, you will receive a postcard with your next transmission date.  Your physician wants you to follow-up in: 6 months with Dr Graciela Husbands.  You will receive a reminder letter in the mail two months in advance. If you don't receive a letter, please call our office to schedule the follow-up appointment.  Your physician recommends that you schedule a follow-up appointment in: October with Dr. [name].

## 2011-12-13 NOTE — Assessment & Plan Note (Addendum)
No intercurrent Ventricular tachycardia  Will continue on amio 300/200  And review again in 6mon amio labs normal in 4/13 No cough  Gi or neuro symptoms

## 2011-12-13 NOTE — Assessment & Plan Note (Signed)
No af 

## 2011-12-13 NOTE — Progress Notes (Signed)
HPI  Michael Krueger is a 56 y.o. male seen in followup for ventricular tachycardia status post ablation. He also has a history of atrial fibrillation and he takes amiodarone for the combination of the 2.He is status post a CRT-D implantation for congestive heart failure in the setting of ischemic heart cardiomyopathy  His last echocardiogram was performed in July of 2010 and revealed an EF of 20-25 and mild MR.  Last cath performed in July of 2010 and revealed an occluded RCA but nonobstructive disease in the Lcx and LAD other than ostial 80 in a small marginal; EF 10-15. He has had CRT and VT ablations  last lipids were February 2012 HDL 33, LDL 85. Amiodarone surveillance laboratories were norma April 2013  He is doing well without complaints of chest pain or shortness of breath. He has noticed some fullness around his defibrillator site. There have been no fevers or chills.  He is had no intercurrent ventricular tachycardia; at the last visit we discussed decreasing his amio,  He was disinclined to acquiesce to my request  But he did subsequently  The patient denies chest pain, shortness of breath, nocturnal dyspnea, orthopnea or peripheral edema.  There have been no palpitations, lightheadedness or syncope.    Past Medical History  Diagnosis Date  . HYPERTHYROIDISM   . HYPERLIPIDEMIA-MIXED   . OBESITY-MORBID (>100')   . CAD   . AV BLOCK, COMPLETE   . VENTRICULAR TACHYCARDIA     S/P RFCA x3  2006, 2010,2010  . Atrial fibrillation   . Chronic systolic heart failure   . TRANSAMINASES, SERUM, ELEVATED   . Long term current use of anticoagulant   . Ischemic cardiomyopathy   . Implantable cardiac defibrillator  medtronic     Initially related 1990s, upgrade to dual-chamber 2002, generator change 2005, generator change August 2010, hematoma evacuation September 2010.  [name] Gynecomastia     Past Surgical History  Procedure Date  . Splenectomy   . Status post medtronic concerto  crt-d in august 2010 with pocket revision     Current Outpatient Prescriptions  Medication Sig Dispense Refill  . amiodarone (PACERONE) 200 MG tablet Take 1 tablet AM and 1/2 tablet PM  45 tablet  6  . atorvastatin (LIPITOR) 80 MG tablet TAKE 1 TABLET BY MOUTH ONCE A DAY  90 tablet  3  . carvedilol (COREG) 12.5 MG tablet TAKE ONE TABLET BY MOUTH TWICE A DAY  60 tablet  3  . digoxin (LANOXIN) 0.125 MG tablet TAKE 1 TABLET BY MOUTH EVERY DAY  30 tablet  9  . furosemide (LASIX) 40 MG tablet Take 40 mg by mouth 2 (two) times daily.        [name] oxyCODONE-acetaminophen (PERCOCET) 5-325 MG per tablet Take 1 tablet by mouth every 4 (four) hours as needed.      . pantoprazole (PROTONIX) 40 MG tablet Take 1 tablet by mouth Daily.      . ramipril (ALTACE) 2.5 MG capsule Take 1 capsule (2.5 mg total) by mouth 2 (two) times daily.  120 capsule  2  . warfarin (COUMADIN) 2.5 MG tablet Take as directed by anticoagulation clinic  30 tablet  3  . spironolactone (ALDACTONE) 25 MG tablet TAKE 1 TABLET BY MOUTH DAILY  30 tablet  11    No Known Allergies  Review of Systems negative except from HPI and PMH  Physical Exam BP 120/79  Pulse 76  Ht 5\' 2"  (1.575 m)  Wt 178 lb (80.74 kg)  BMI 32.56 kg/m2 Well developed and well nourished in no acute distress HENT normal E scleral and icterus clear Neck Supple Breasts are large bilaterally. There is some fullness over his ICD but no fluid or warmth. JVP flat; carotids brisk and full Clear to ausculation Regular rate and rhythm, no murmurs gallops or rub Soft with active bowel sounds No clubbing cyanosis none Edema Alert and oriented, grossly normal motor and sensory function Skin Warm and Dry   Assessment and  Plan A

## 2011-12-13 NOTE — Assessment & Plan Note (Signed)
stabel \  ocntinue current meds

## 2011-12-13 NOTE — Assessment & Plan Note (Signed)
The patient's device was interrogated.  The information was reviewed. No changes were made in the programming.    

## 2011-12-20 ENCOUNTER — Other Ambulatory Visit: Payer: Self-pay | Admitting: Internal Medicine

## 2012-01-12 ENCOUNTER — Ambulatory Visit (INDEPENDENT_AMBULATORY_CARE_PROVIDER_SITE_OTHER): Payer: Medicare Other | Admitting: *Deleted

## 2012-01-12 DIAGNOSIS — I4891 Unspecified atrial fibrillation: Secondary | ICD-10-CM

## 2012-01-12 DIAGNOSIS — Z7901 Long term (current) use of anticoagulants: Secondary | ICD-10-CM

## 2012-02-02 ENCOUNTER — Ambulatory Visit (INDEPENDENT_AMBULATORY_CARE_PROVIDER_SITE_OTHER): Payer: Medicare Other | Admitting: *Deleted

## 2012-02-02 DIAGNOSIS — I4891 Unspecified atrial fibrillation: Secondary | ICD-10-CM

## 2012-02-02 DIAGNOSIS — Z7901 Long term (current) use of anticoagulants: Secondary | ICD-10-CM

## 2012-02-03 ENCOUNTER — Other Ambulatory Visit: Payer: Self-pay | Admitting: Cardiology

## 2012-02-13 ENCOUNTER — Encounter: Payer: Self-pay | Admitting: *Deleted

## 2012-02-13 ENCOUNTER — Encounter: Payer: Self-pay | Admitting: Cardiology

## 2012-02-13 ENCOUNTER — Ambulatory Visit (INDEPENDENT_AMBULATORY_CARE_PROVIDER_SITE_OTHER)
Admission: RE | Admit: 2012-02-13 | Discharge: 2012-02-13 | Disposition: A | Discharge status: Home / Self Care | Payer: Medicare Other | Source: Ambulatory Visit | Attending: Cardiology | Admitting: Cardiology

## 2012-02-13 ENCOUNTER — Ambulatory Visit (INDEPENDENT_AMBULATORY_CARE_PROVIDER_SITE_OTHER): Payer: Medicare Other | Admitting: Cardiology

## 2012-02-13 VITALS — BP 117/81 | HR 76 | Ht 62.0 in | Wt 181.0 lb

## 2012-02-13 DIAGNOSIS — Z95810 Presence of automatic (implantable) cardiac defibrillator: Secondary | ICD-10-CM

## 2012-02-13 DIAGNOSIS — I472 Ventricular tachycardia, unspecified: Secondary | ICD-10-CM

## 2012-02-13 DIAGNOSIS — I2510 Atherosclerotic heart disease of native coronary artery without angina pectoris: Secondary | ICD-10-CM

## 2012-02-13 DIAGNOSIS — I255 Ischemic cardiomyopathy: Secondary | ICD-10-CM

## 2012-02-13 DIAGNOSIS — I5022 Chronic systolic (congestive) heart failure: Secondary | ICD-10-CM

## 2012-02-13 DIAGNOSIS — E785 Hyperlipidemia, unspecified: Secondary | ICD-10-CM

## 2012-02-13 DIAGNOSIS — I4891 Unspecified atrial fibrillation: Secondary | ICD-10-CM

## 2012-02-13 DIAGNOSIS — I2589 Other forms of chronic ischemic heart disease: Secondary | ICD-10-CM

## 2012-02-13 LAB — LIPID PANEL
HDL: 29.7 mg/dL — ABNORMAL LOW (ref >39.00)
LDL Cholesterol: 92 mg/dL (ref 0–99)
Total CHOL/HDL Ratio: 5
VLDL: 37.2 mg/dL (ref 0.0–40.0)

## 2012-02-13 LAB — BASIC METABOLIC PANEL
BUN: 16 mg/dL (ref 6–23)
Calcium: 8.9 mg/dL (ref 8.4–10.5)
GFR: 74.35 mL/min (ref >60.00)
Potassium: 3.8 mEq/L (ref 3.5–5.1)
Sodium: 140 mEq/L (ref 135–145)

## 2012-02-13 LAB — CBC WITH DIFFERENTIAL/PLATELET
Eosinophils Absolute: 0.1 10*3/uL (ref 0.0–0.7)
MCHC: 33.2 g/dL (ref 30.0–36.0)
MCV: 98.4 fl (ref 78.0–100.0)
Monocytes Absolute: 0.9 10*3/uL (ref 0.1–1.0)
Neutrophils Relative %: 65 % (ref 43.0–77.0)
Platelets: 206 10*3/uL (ref 150.0–400.0)

## 2012-02-13 LAB — HEPATIC FUNCTION PANEL
ALT: 42 U/L (ref 0–53)
Bilirubin, Direct: 0.1 mg/dL (ref 0.0–0.3)
Total Bilirubin: 0.9 mg/dL (ref 0.3–1.2)

## 2012-02-13 LAB — TSH: TSH: 2.29 u[IU]/mL (ref 0.35–5.50)

## 2012-02-13 NOTE — Assessment & Plan Note (Signed)
Continue ACE inhibitor and beta blocker. 

## 2012-02-13 NOTE — Patient Instructions (Signed)
Your physician wants you to follow-up in: 6 MONTHS WITH DR [name] You will receive a reminder letter in the mail two months in advance. If you don't receive a letter, please call our office to schedule the follow-up appointment.   Your physician recommends that you HAVE LAB WORK TODAY  A chest x-ray takes a picture of the organs and structures inside the chest, including the heart, lungs, and blood vessels. This test can show several things, including, whether the heart is enlarges; whether fluid is building up in the lungs; and whether pacemaker / defibrillator leads are still in place. AT [address]

## 2012-02-13 NOTE — Assessment & Plan Note (Signed)
euvolemic on examination. Continue present dose of diuretics. Take additional 40 mg daily when necessary edema or increased dyspnea. Check potassium and renal function.

## 2012-02-13 NOTE — Assessment & Plan Note (Signed)
Continue amiodarone. Check liver functions, TSH and chest x-ray.

## 2012-02-13 NOTE — Assessment & Plan Note (Signed)
Followed by electrophysiology. 

## 2012-02-13 NOTE — Assessment & Plan Note (Signed)
Not on aspirin given need for Coumadin. Continue statin. Recent Myoview showed no ischemia.

## 2012-02-13 NOTE — Assessment & Plan Note (Signed)
Continue statin. Check lipids and liver. 

## 2012-02-13 NOTE — Progress Notes (Signed)
HPI: Mr. Hohman is a pleasant gentleman with a history of coronary artery disease, ischemic cardiomyopathy, history of ICD, history of ventricular tachycardia status post ablation, history of atrial fibrillation on amiodarone therapy. His last echocardiogram was performed in July of 2010 and revealed an EF of 20-25 and mild MR. Last cath performed in July of 2010 and revealed an occluded RCA but nonobstructive disease in the Lcx and LAD other than ostial 80 in a small marginal; EF 10-15. He has had CRT and VT ablations. Myoview in April of 2013 showed an ejection fraction of 14%. There was scar but no ischemia. I last saw him in April of 2013. Since then, the patient has dyspnea with more extreme activities but not with routine activities. It is relieved with rest. It is not associated with chest pain. There is no orthopnea, PND. There is no syncope or palpitations. There is no exertional chest pain. Occasional mild pedal edema.  Current Outpatient Prescriptions  Medication Sig Dispense Refill  . amiodarone (PACERONE) 200 MG tablet Take 1 tablet AM and 1/2 tablet PM  45 tablet  6  . atorvastatin (LIPITOR) 80 MG tablet TAKE 1 TABLET BY MOUTH ONCE A DAY  90 tablet  3  . carvedilol (COREG) 12.5 MG tablet TAKE ONE TABLET BY MOUTH TWICE A DAY  60 tablet  3  . digoxin (LANOXIN) 0.125 MG tablet TAKE 1 TABLET BY MOUTH EVERY DAY  30 tablet  9  . furosemide (LASIX) 40 MG tablet Take 40 mg by mouth 2 (two) times daily.        [name] oxyCODONE-acetaminophen (PERCOCET) 5-325 MG per tablet Take 1 tablet by mouth every 4 (four) hours as needed.      . pantoprazole (PROTONIX) 40 MG tablet Take 1 tablet by mouth Daily.      . ramipril (ALTACE) 2.5 MG capsule TAKE 1 CAPSULE BY MOUTH 2 TIMES DAILY.  120 capsule  2  . warfarin (COUMADIN) 2.5 MG tablet Take as directed by anticoagulation clinic  30 tablet  3  . DISCONTD: PACERONE 200 MG tablet TAKE 1 TABLET BY MOUTH EVERY MORNING AND 1/2 TABLET IN THE EVENING  45 tablet  6      Past Medical History  Diagnosis Date  . HYPERTHYROIDISM   . HYPERLIPIDEMIA-MIXED   . OBESITY-MORBID (>100')   . CAD   . AV BLOCK, COMPLETE   . VENTRICULAR TACHYCARDIA     S/P RFCA x3  2006, 2010,2010  . Atrial fibrillation   . Chronic systolic heart failure   . TRANSAMINASES, SERUM, ELEVATED   . Long term current use of anticoagulant   . Ischemic cardiomyopathy   . Implantable cardiac defibrillator  medtronic     Initially related 1990s, upgrade to dual-chamber 2002, generator change 2005, generator change August 2010, hematoma evacuation September 2010.  [name] Gynecomastia     Past Surgical History  Procedure Date  . Splenectomy   . Status post medtronic concerto crt-d in august 2010 with pocket revision     History   Social History  . Marital Status: Divorced    Spouse Name: N/A    Number of Children: N/A  . Years of Education: N/A   Occupational History  . Not on file.   Social History Main Topics  . Smoking status: Former Games developer  . Smokeless tobacco: Never Used  . Alcohol Use: Yes  . Drug Use: No  . Sexually Active: Not on file   Other Topics Concern  . Not on  file   Social History Narrative  . No narrative on file    ROS: no fevers or chills, productive cough, hemoptysis, dysphasia, odynophagia, melena, hematochezia, dysuria, hematuria, rash, seizure activity, orthopnea, PND, pedal edema, claudication. Remaining systems are negative.  Physical Exam: Well-developed well-nourished in no acute distress.  Skin is warm and dry.  HEENT is normal.  Neck is supple. Chest is clear to auscultation with normal expansion. ICD left chest Cardiovascular exam is regular rate and rhythm.  Abdominal exam nontender or distended. No masses palpated. Ventral and umbilical hernia. Extremities show no edema. neuro grossly intact  ECG AV paced rhythm.

## 2012-02-13 NOTE — Assessment & Plan Note (Signed)
Continue amiodarone. Continue Coumadin. Check hemoglobin.

## 2012-02-29 ENCOUNTER — Other Ambulatory Visit: Payer: Self-pay | Admitting: Internal Medicine

## 2012-03-12 ENCOUNTER — Other Ambulatory Visit: Payer: Self-pay | Admitting: Cardiology

## 2012-03-14 ENCOUNTER — Ambulatory Visit (INDEPENDENT_AMBULATORY_CARE_PROVIDER_SITE_OTHER): Payer: Medicare Other | Admitting: *Deleted

## 2012-03-14 DIAGNOSIS — Z7901 Long term (current) use of anticoagulants: Secondary | ICD-10-CM

## 2012-03-14 DIAGNOSIS — I4891 Unspecified atrial fibrillation: Secondary | ICD-10-CM

## 2012-03-14 LAB — POCT INR: INR: 2.5

## 2012-03-16 ENCOUNTER — Other Ambulatory Visit: Payer: Self-pay | Admitting: *Deleted

## 2012-03-16 MED ORDER — WARFARIN SODIUM 2.5 MG PO TABS: ORAL_TABLET | ORAL | Status: DC

## 2012-03-19 ENCOUNTER — Encounter: Payer: Medicare Other | Admitting: *Deleted

## 2012-03-27 ENCOUNTER — Encounter: Payer: Self-pay | Admitting: *Deleted

## 2012-03-31 ENCOUNTER — Other Ambulatory Visit: Payer: Self-pay | Admitting: Internal Medicine

## 2012-04-03 ENCOUNTER — Other Ambulatory Visit: Payer: Self-pay | Admitting: Internal Medicine

## 2012-04-03 MED ORDER — WARFARIN SODIUM 2.5 MG PO TABS: 2.5000 mg | ORAL_TABLET | ORAL | Status: DC

## 2012-04-06 ENCOUNTER — Ambulatory Visit (INDEPENDENT_AMBULATORY_CARE_PROVIDER_SITE_OTHER): Payer: Medicare Other | Admitting: *Deleted

## 2012-04-06 ENCOUNTER — Encounter: Payer: Self-pay | Admitting: Internal Medicine

## 2012-04-06 DIAGNOSIS — I2589 Other forms of chronic ischemic heart disease: Secondary | ICD-10-CM

## 2012-04-06 DIAGNOSIS — I5022 Chronic systolic (congestive) heart failure: Secondary | ICD-10-CM

## 2012-04-06 DIAGNOSIS — Z95810 Presence of automatic (implantable) cardiac defibrillator: Secondary | ICD-10-CM

## 2012-04-06 DIAGNOSIS — I255 Ischemic cardiomyopathy: Secondary | ICD-10-CM

## 2012-04-16 LAB — REMOTE ICD DEVICE
AL IMPEDENCE ICD: 418 Ohm
BAMS-0001: 135 {beats}/min
CHARGE TIME: 10.56 s
FVT: 0
PACEART VT: 0
RV LEAD AMPLITUDE: 18.4 mv
TOT-0001: 0
TOT-0002: 0
TZAT-0001ATACH: 2
TZAT-0002ATACH: NEGATIVE
TZAT-0004SLOWVT: 8
TZAT-0004SLOWVT: 8
TZAT-0011FASTVT: 10 ms
TZAT-0011FASTVT: 10 ms
TZAT-0011SLOWVT: 10 ms
TZAT-0011SLOWVT: 10 ms
TZAT-0012ATACH: 150 ms
TZAT-0012ATACH: 150 ms
TZAT-0012FASTVT: 200 ms
TZAT-0012FASTVT: 200 ms
TZAT-0013FASTVT: 2
TZAT-0018ATACH: NEGATIVE
TZAT-0018FASTVT: NEGATIVE
TZAT-0018FASTVT: NEGATIVE
TZAT-0019FASTVT: 8 V
TZAT-0019FASTVT: 8 V
TZAT-0019SLOWVT: 8 V
TZAT-0019SLOWVT: 8 V
TZAT-0020ATACH: 1.5 ms
TZAT-0020ATACH: 1.5 ms
TZAT-0020FASTVT: 1.5 ms
TZAT-0020FASTVT: 1.5 ms
TZAT-0020SLOWVT: 1.5 ms
TZON-0003ATACH: 450 ms
TZON-0003FASTVT: 270 ms
TZON-0003VSLOWVT: 640 ms
TZON-0004VSLOWVT: 20
TZST-0001ATACH: 4
TZST-0001ATACH: 6
TZST-0001FASTVT: 4
TZST-0001SLOWVT: 4
TZST-0002SLOWVT: NEGATIVE
TZST-0003FASTVT: 35 J
TZST-0003FASTVT: 35 J
VENTRICULAR PACING ICD: 100 pct

## 2012-04-30 ENCOUNTER — Ambulatory Visit (INDEPENDENT_AMBULATORY_CARE_PROVIDER_SITE_OTHER): Payer: Medicare Other | Admitting: *Deleted

## 2012-04-30 DIAGNOSIS — Z7901 Long term (current) use of anticoagulants: Secondary | ICD-10-CM

## 2012-04-30 DIAGNOSIS — I4891 Unspecified atrial fibrillation: Secondary | ICD-10-CM

## 2012-05-03 ENCOUNTER — Encounter: Payer: Self-pay | Admitting: *Deleted

## 2012-06-11 ENCOUNTER — Ambulatory Visit (INDEPENDENT_AMBULATORY_CARE_PROVIDER_SITE_OTHER): Payer: PRIVATE HEALTH INSURANCE | Admitting: *Deleted

## 2012-06-11 DIAGNOSIS — Z7901 Long term (current) use of anticoagulants: Secondary | ICD-10-CM

## 2012-06-11 DIAGNOSIS — I4891 Unspecified atrial fibrillation: Secondary | ICD-10-CM

## 2012-06-11 LAB — POCT INR: INR: 2

## 2012-06-20 ENCOUNTER — Telehealth: Payer: Self-pay | Admitting: Cardiology

## 2012-06-20 NOTE — Telephone Encounter (Signed)
Will forward for dr crenshaw review  

## 2012-06-20 NOTE — Telephone Encounter (Signed)
Ok to hold coumadin 5 days prior to procedure and resume after. Brian Crenshaw  

## 2012-06-20 NOTE — Telephone Encounter (Signed)
New problem    colonoscopy on  4/8. Need to hold coumadin  5 days prior.

## 2012-06-20 NOTE — Telephone Encounter (Signed)
Will fax note to number provided. 

## 2012-07-27 ENCOUNTER — Other Ambulatory Visit: Payer: Self-pay | Admitting: Cardiology

## 2012-07-30 ENCOUNTER — Ambulatory Visit (INDEPENDENT_AMBULATORY_CARE_PROVIDER_SITE_OTHER): Payer: PRIVATE HEALTH INSURANCE | Admitting: *Deleted

## 2012-07-30 DIAGNOSIS — Z7901 Long term (current) use of anticoagulants: Secondary | ICD-10-CM

## 2012-07-30 DIAGNOSIS — I4891 Unspecified atrial fibrillation: Secondary | ICD-10-CM

## 2012-07-30 LAB — POCT INR: INR: 2.2

## 2012-08-17 ENCOUNTER — Encounter: Payer: Self-pay | Admitting: Internal Medicine

## 2012-08-17 ENCOUNTER — Ambulatory Visit (INDEPENDENT_AMBULATORY_CARE_PROVIDER_SITE_OTHER): Payer: PRIVATE HEALTH INSURANCE | Admitting: Internal Medicine

## 2012-08-17 VITALS — BP 108/69 | HR 74 | Ht 62.0 in | Wt 181.4 lb

## 2012-08-17 DIAGNOSIS — I442 Atrioventricular block, complete: Secondary | ICD-10-CM

## 2012-08-17 DIAGNOSIS — I255 Ischemic cardiomyopathy: Secondary | ICD-10-CM

## 2012-08-17 DIAGNOSIS — I5022 Chronic systolic (congestive) heart failure: Secondary | ICD-10-CM

## 2012-08-17 DIAGNOSIS — I472 Ventricular tachycardia, unspecified: Secondary | ICD-10-CM

## 2012-08-17 DIAGNOSIS — IMO0002 Reserved for concepts with insufficient information to code with codable children: Secondary | ICD-10-CM | POA: Insufficient documentation

## 2012-08-17 DIAGNOSIS — I2589 Other forms of chronic ischemic heart disease: Secondary | ICD-10-CM

## 2012-08-17 DIAGNOSIS — Z95810 Presence of automatic (implantable) cardiac defibrillator: Secondary | ICD-10-CM

## 2012-08-17 DIAGNOSIS — I4891 Unspecified atrial fibrillation: Secondary | ICD-10-CM

## 2012-08-17 DIAGNOSIS — E785 Hyperlipidemia, unspecified: Secondary | ICD-10-CM

## 2012-08-17 DIAGNOSIS — N62 Hypertrophy of breast: Secondary | ICD-10-CM

## 2012-08-17 LAB — ICD DEVICE OBSERVATION
AL IMPEDENCE ICD: 475 Ohm
BATTERY VOLTAGE: 2.9506 V
LV LEAD IMPEDENCE ICD: 912 Ohm
RV LEAD THRESHOLD: 1.5 V
TOT-0001: 0
TOT-0002: 0
TOT-0006: 20100818000000
TZAT-0001ATACH: 1
TZAT-0001FASTVT: 1
TZAT-0001FASTVT: 2
TZAT-0001SLOWVT: 1
TZAT-0001SLOWVT: 2
TZAT-0002ATACH: NEGATIVE
TZAT-0002ATACH: NEGATIVE
TZAT-0004SLOWVT: 8
TZAT-0004SLOWVT: 8
TZAT-0005FASTVT: 88 pct
TZAT-0005SLOWVT: 88 pct
TZAT-0005SLOWVT: 91 pct
TZAT-0011SLOWVT: 10 ms
TZAT-0011SLOWVT: 10 ms
TZAT-0012ATACH: 150 ms
TZAT-0013FASTVT: 2
TZAT-0018ATACH: NEGATIVE
TZAT-0018FASTVT: NEGATIVE
TZAT-0018FASTVT: NEGATIVE
TZAT-0018SLOWVT: NEGATIVE
TZAT-0018SLOWVT: NEGATIVE
TZAT-0019ATACH: 6 V
TZAT-0019ATACH: 6 V
TZAT-0019ATACH: 6 V
TZAT-0019FASTVT: 8 V
TZAT-0019FASTVT: 8 V
TZAT-0020ATACH: 1.5 ms
TZAT-0020ATACH: 1.5 ms
TZAT-0020FASTVT: 1.5 ms
TZON-0003ATACH: 450 ms
TZON-0003FASTVT: 270 ms
TZON-0004SLOWVT: 100
TZON-0005SLOWVT: 12
TZST-0001ATACH: 4
TZST-0001ATACH: 5
TZST-0001FASTVT: 3
TZST-0001SLOWVT: 3
TZST-0001SLOWVT: 4
TZST-0001SLOWVT: 5
TZST-0002ATACH: NEGATIVE
TZST-0002SLOWVT: NEGATIVE
TZST-0002SLOWVT: NEGATIVE
TZST-0002SLOWVT: NEGATIVE
TZST-0003FASTVT: 35 J
TZST-0003FASTVT: 35 J
VENTRICULAR PACING ICD: 100 pct

## 2012-08-17 LAB — LIPID PANEL
Cholesterol: 193 mg/dL (ref 0–200)
LDL Cholesterol: 125 mg/dL — ABNORMAL HIGH (ref 0–99)
Total CHOL/HDL Ratio: 6

## 2012-08-17 LAB — HEPATIC FUNCTION PANEL
ALT: 35 U/L (ref 0–53)
Bilirubin, Direct: 0.1 mg/dL (ref 0.0–0.3)
Total Protein: 8 g/dL (ref 6.0–8.3)

## 2012-08-17 LAB — TSH: TSH: 3.22 u[IU]/mL (ref 0.35–5.50)

## 2012-08-17 NOTE — Assessment & Plan Note (Signed)
Breast enlargement unilaterally to be related to statin therapy. I've also spoke with Dr. Yolanda Bonine. We will plan to undertake an ultrasound and mammography next week. We will coordinate with his visit to Dr. [name]

## 2012-08-17 NOTE — Assessment & Plan Note (Signed)
The patient's device was interrogated.  The information was reviewed. No changes were made in the programming.    

## 2012-08-17 NOTE — Assessment & Plan Note (Signed)
No intercurrent afib 

## 2012-08-17 NOTE — Patient Instructions (Addendum)
Remote monitoring is used to monitor your Pacemaker of ICD from home. This monitoring reduces the number of office visits required to check your device to one time per year. It allows [place] to keep an eye on the functioning of your device to ensure it is working properly. You are scheduled for a device check from home on 11/19/12. You may send your transmission at any time that day. If you have a wireless device, the transmission will be sent automatically. After your physician reviews your transmission, you will receive a postcard with your next transmission date.  Your physician recommends that you return for lab work in: today - TSH, Liver, Digoxin levels  Your appt with Garald Braver is at 10:45 on 9/22

## 2012-08-17 NOTE — Progress Notes (Signed)
Patient has no care team.   HPI  Michael Krueger is a 57 y.o. male seen in followup for ventricular tachycardia status post ablation. He also has a history of atrial fibrillation and he takes amiodarone for the combination of the 2.He is status post a CRT-D implantation for congestive heart failure in the setting of ischemic heart cardiomyopathy  His last echocardiogram was performed in July of 2010 and revealed an EF of 20-25 and mild MR.  Last cath performed in July of 2010 and revealed an occluded RCA but nonobstructive disease in the Lcx and LAD other than ostial 80 in a small marginal; EF 10-15. He has had CRT and VT ablations  last lipids were February 2012 HDL 33, LDL 85. Amiodarone surveillance laboratories were norma April 2013   He is doing well without complaints of chest pain or shortness of breath. He has noticed some fullness around his defibrillator site. This has not changedThere have been no fevers or chills.  He is had no intercurrent ventricular tachycardia; at the last visit we discussed decreasing his amio, He was disinclined to acquiesce to my request But he did subsequently  He is now taking 200qAM and 200 qpm MWF The patient denies chest pain, shortness of breath, nocturnal dyspnea, orthopnea or peripheral edema. There have been no palpitations, lightheadedness or syncope.    Past Medical History  Diagnosis Date  . HYPERTHYROIDISM   . HYPERLIPIDEMIA-MIXED   . OBESITY-MORBID (>100')   . CAD   . AV BLOCK, COMPLETE   . VENTRICULAR TACHYCARDIA     S/P RFCA x3  2006, 2010,2010  . Atrial fibrillation   . Chronic systolic heart failure   . TRANSAMINASES, SERUM, ELEVATED   . Long term current use of anticoagulant   . Ischemic cardiomyopathy   . Implantable cardiac defibrillator  medtronic     Initially related 1990s, upgrade to dual-chamber 2002, generator change 2005, generator change August 2010, hematoma evacuation September 2010.  [name] Gynecomastia     Past  Surgical History  Procedure Laterality Date  . Splenectomy    . Status post medtronic concerto crt-d in august 2010 with pocket revision      Current Outpatient Prescriptions  Medication Sig Dispense Refill  . amiodarone (PACERONE) 200 MG tablet Take 1 tablet AM and 1/2 tablet PM  45 tablet  6  . atorvastatin (LIPITOR) 80 MG tablet TAKE 1 TABLET BY MOUTH ONCE A DAY  90 tablet  3  . carvedilol (COREG) 12.5 MG tablet TAKE 1 TABLET BY MOUTH TWICE A DAY  60 tablet  3  . digoxin (LANOXIN) 0.125 MG tablet TAKE 1 TABLET BY MOUTH EVERY DAY  30 tablet  9  . furosemide (LASIX) 40 MG tablet Take 40 mg by mouth 2 (two) times daily.        . pantoprazole (PROTONIX) 40 MG tablet Take 1 tablet by mouth Daily.      . ramipril (ALTACE) 2.5 MG capsule TAKE 1 CAPSULE BY MOUTH 2 TIMES DAILY.  120 capsule  2  . warfarin (COUMADIN) 2.5 MG tablet Take 1 tablet (2.5 mg total) by mouth as directed.  30 tablet  3   No current facility-administered medications for this visit.    No Known Allergies  Review of Systems negative except from HPI and PMH  Physical Exam BP 108/69  Pulse 74  Ht 5\' 2"  (1.575 m)  Wt 181 lb 6.4 oz (82.283 kg)  BMI 33.17 kg/m2 Well developed and well nourished in  no acute distress HENT normal E scleral and icterus clear Neck Supple JVP flat; carotids brisk and full Clear to ausculation  significant L breast enlargement Regular rate and rhythm, no murmurs gallops or rub Soft with active bowel sounds No clubbing cyanosis none Edema Alert and oriented, grossly normal motor and sensory function Skin Warm and Dry    Assessment and  Plan

## 2012-08-17 NOTE — Assessment & Plan Note (Signed)
Stable continue curent meds

## 2012-08-17 NOTE — Assessment & Plan Note (Addendum)
No intercurrent VT will decrease amio 200qAM and 200 q PM MWF>>MF  We will check amiodarone surveillance laboratories digoxin level for drug interaction

## 2012-08-21 ENCOUNTER — Encounter: Payer: Self-pay | Admitting: Cardiology

## 2012-08-21 ENCOUNTER — Ambulatory Visit (INDEPENDENT_AMBULATORY_CARE_PROVIDER_SITE_OTHER): Payer: PRIVATE HEALTH INSURANCE | Admitting: Cardiology

## 2012-08-21 ENCOUNTER — Other Ambulatory Visit: Payer: Self-pay | Admitting: Internal Medicine

## 2012-08-21 ENCOUNTER — Ambulatory Visit (INDEPENDENT_AMBULATORY_CARE_PROVIDER_SITE_OTHER)
Admission: RE | Admit: 2012-08-21 | Discharge: 2012-08-21 | Disposition: A | Discharge status: Home / Self Care | Payer: PRIVATE HEALTH INSURANCE | Source: Ambulatory Visit | Attending: Cardiology | Admitting: Cardiology

## 2012-08-21 ENCOUNTER — Other Ambulatory Visit: Payer: Self-pay | Admitting: Cardiology

## 2012-08-21 VITALS — BP 120/80 | HR 77 | Ht 62.0 in | Wt 181.8 lb

## 2012-08-21 DIAGNOSIS — Z95810 Presence of automatic (implantable) cardiac defibrillator: Secondary | ICD-10-CM

## 2012-08-21 DIAGNOSIS — I255 Ischemic cardiomyopathy: Secondary | ICD-10-CM

## 2012-08-21 DIAGNOSIS — I472 Ventricular tachycardia: Secondary | ICD-10-CM

## 2012-08-21 DIAGNOSIS — I2510 Atherosclerotic heart disease of native coronary artery without angina pectoris: Secondary | ICD-10-CM

## 2012-08-21 DIAGNOSIS — I5022 Chronic systolic (congestive) heart failure: Secondary | ICD-10-CM

## 2012-08-21 DIAGNOSIS — IMO0002 Reserved for concepts with insufficient information to code with codable children: Secondary | ICD-10-CM

## 2012-08-21 DIAGNOSIS — I2589 Other forms of chronic ischemic heart disease: Secondary | ICD-10-CM

## 2012-08-21 DIAGNOSIS — I4891 Unspecified atrial fibrillation: Secondary | ICD-10-CM

## 2012-08-21 DIAGNOSIS — E785 Hyperlipidemia, unspecified: Secondary | ICD-10-CM

## 2012-08-21 DIAGNOSIS — N62 Hypertrophy of breast: Secondary | ICD-10-CM

## 2012-08-21 NOTE — Assessment & Plan Note (Signed)
Management per electrophysiology. 

## 2012-08-21 NOTE — Assessment & Plan Note (Signed)
Dr. Graciela Husbands ordered mammogram and ultrasound. We will followup on results.

## 2012-08-21 NOTE — Progress Notes (Signed)
HPI: Michael Krueger is a pleasant gentleman with a history of coronary artery disease, ischemic cardiomyopathy, history of ICD, history of ventricular tachycardia status post ablation, history of atrial fibrillation on amiodarone therapy. His last echocardiogram was performed in July of 2010 and revealed an EF of 20-25 and mild MR. Last cath performed in July of 2010 and revealed an occluded RCA but nonobstructive disease in the Lcx and LAD other than ostial 80 in a small marginal; EF 10-15. He has had CRT and VT ablations. Myoview in April of 2013 showed an ejection fraction of 14%. There was scar but no ischemia. I last saw him in Oct of 2013. Since then, the patient has dyspnea with more extreme activities but not with routine activities. It is relieved with rest. It is not associated with chest pain. There is no orthopnea, PND. There is no syncope or palpitations. There is no exertional chest pain. Occasional mild pedal edema.   Current Outpatient Prescriptions  Medication Sig Dispense Refill  . amiodarone (PACERONE) 200 MG tablet Take 1 tablet AM and 1/2 tablet Monday AND FRIDAY PM      . atorvastatin (LIPITOR) 80 MG tablet TAKE 1 TABLET BY MOUTH ONCE A DAY  90 tablet  3  . carvedilol (COREG) 12.5 MG tablet TAKE 1 TABLET BY MOUTH TWICE A DAY  60 tablet  3  . digoxin (LANOXIN) 0.125 MG tablet TAKE 1 TABLET BY MOUTH EVERY DAY  30 tablet  9  . furosemide (LASIX) 40 MG tablet Take 40 mg by mouth 2 (two) times daily.        . pantoprazole (PROTONIX) 40 MG tablet Take 1 tablet by mouth Daily.      . ramipril (ALTACE) 2.5 MG capsule TAKE 1 CAPSULE BY MOUTH 2 TIMES DAILY.  120 capsule  2  . warfarin (COUMADIN) 2.5 MG tablet Take 1 tablet (2.5 mg total) by mouth as directed.  30 tablet  3   No current facility-administered medications for this visit.     Past Medical History  Diagnosis Date  . HYPERTHYROIDISM   . HYPERLIPIDEMIA-MIXED   . OBESITY-MORBID (>100')   . CAD   . AV BLOCK, COMPLETE     . VENTRICULAR TACHYCARDIA     S/P RFCA x3  2006, 2010,2010  . Atrial fibrillation   . Chronic systolic heart failure   . TRANSAMINASES, SERUM, ELEVATED   . Long term current use of anticoagulant   . Ischemic cardiomyopathy   . Implantable cardiac defibrillator  medtronic     Initially related 1990s, upgrade to dual-chamber 2002, generator change 2005, generator change August 2010, hematoma evacuation September 2010.  [name] Gynecomastia     Past Surgical History  Procedure Laterality Date  . Splenectomy    . Status post medtronic concerto crt-d in august 2010 with pocket revision      History   Social History  . Marital Status: Divorced    Spouse Name: N/A    Number of Children: N/A  . Years of Education: N/A   Occupational History  . Not on file.   Social History Main Topics  . Smoking status: Former Games developer  . Smokeless tobacco: Never Used  . Alcohol Use: Yes  . Drug Use: No  . Sexually Active: Not on file   Other Topics Concern  . Not on file   Social History Narrative  . No narrative on file    ROS: no fevers or chills, productive cough, hemoptysis, dysphasia, odynophagia, melena, hematochezia, dysuria,  hematuria, rash, seizure activity, orthopnea, PND, pedal edema, claudication. Remaining systems are negative.  Physical Exam: Well-developed well-nourished in no acute distress.  Skin is warm and dry.  HEENT is normal.  Neck is supple.  Chest is clear to auscultation with normal expansion. ICD left chest. Cardiovascular exam is regular rate and rhythm.  Abdominal exam nontender or distended. No masses palpated. Extremities show no edema. neuro grossly intact

## 2012-08-21 NOTE — Assessment & Plan Note (Signed)
euvolemic on examination. Continue present dose of Lasix. 

## 2012-08-21 NOTE — Assessment & Plan Note (Signed)
Continue statin. Not on aspirin given need for Coumadin. 

## 2012-08-21 NOTE — Assessment & Plan Note (Signed)
Continue amiodarone. Recent liver functions and TSH normal. Check chest x-ray.

## 2012-08-21 NOTE — Assessment & Plan Note (Signed)
Continue ACE inhibitor and beta blocker. 

## 2012-08-21 NOTE — Assessment & Plan Note (Signed)
Continue statin. 

## 2012-08-21 NOTE — Patient Instructions (Addendum)
Your physician wants you to follow-up in: 6 MONTHS WITH DR CRENSHAW You will receive a reminder letter in the mail two months in advance. If you don't receive a letter, please call our office to schedule the follow-up appointment.   A chest x-ray takes a picture of the organs and structures inside the chest, including the heart, lungs, and blood vessels. This test can show several things, including, whether the heart is enlarges; whether fluid is building up in the lungs; and whether pacemaker / defibrillator leads are still in place. AT [address] [facility] 

## 2012-08-21 NOTE — Assessment & Plan Note (Signed)
Continue beta blocker and Coumadin. 

## 2012-08-23 ENCOUNTER — Telehealth: Payer: Self-pay | Admitting: Cardiology

## 2012-08-23 ENCOUNTER — Ambulatory Visit: Payer: Medicare Other | Admitting: Cardiology

## 2012-08-23 DIAGNOSIS — I4891 Unspecified atrial fibrillation: Secondary | ICD-10-CM

## 2012-08-23 MED ORDER — DIGOXIN 62.5 MCG PO TABS: 0.0625 mg | ORAL_TABLET | Freq: Every day | ORAL | Status: DC

## 2012-08-23 NOTE — Telephone Encounter (Signed)
Spoke with pt, aware of mammagram, cxr and lab results. New script for reduced dosage of dig sent to the pharm. Paperwork for labs to be drawn at [facility] lab in [place] mailed to pt. He will have lab drawn 09-10-12 with  Coumadin check.

## 2012-08-23 NOTE — Telephone Encounter (Signed)
New problem ° ° °Pt returning your call from yesterday. Please call pt. °

## 2012-09-05 ENCOUNTER — Encounter: Payer: Self-pay | Admitting: Internal Medicine

## 2012-09-05 ENCOUNTER — Encounter: Payer: Self-pay | Admitting: Cardiology

## 2012-09-10 ENCOUNTER — Ambulatory Visit (INDEPENDENT_AMBULATORY_CARE_PROVIDER_SITE_OTHER): Payer: PRIVATE HEALTH INSURANCE | Admitting: *Deleted

## 2012-09-10 DIAGNOSIS — I4891 Unspecified atrial fibrillation: Secondary | ICD-10-CM

## 2012-09-10 DIAGNOSIS — Z7901 Long term (current) use of anticoagulants: Secondary | ICD-10-CM

## 2012-09-10 LAB — DIGOXIN LEVEL: Digoxin Level: 0.9 ng/mL (ref 0.8–2.0)

## 2012-09-17 ENCOUNTER — Other Ambulatory Visit: Payer: Self-pay | Admitting: Emergency Medicine

## 2012-09-17 MED ORDER — WARFARIN SODIUM 2.5 MG PO TABS: 2.5000 mg | ORAL_TABLET | ORAL | Status: DC

## 2012-10-08 ENCOUNTER — Other Ambulatory Visit: Payer: Self-pay | Admitting: Internal Medicine

## 2012-10-08 NOTE — Telephone Encounter (Signed)
..   Requested Prescriptions   Pending Prescriptions Disp Refills  . amiodarone (PACERONE) 200 MG tablet [Pharmacy Med Name: AMIODARONE HCL 200 MG TABLET] 45 tablet 6    Sig: TAKE 1 TABLET BY MOUTH EVERY MORNING THEN 1/2 TABLET IN THE EVENING

## 2012-10-25 ENCOUNTER — Ambulatory Visit (INDEPENDENT_AMBULATORY_CARE_PROVIDER_SITE_OTHER): Payer: PRIVATE HEALTH INSURANCE | Admitting: *Deleted

## 2012-10-25 DIAGNOSIS — Z7901 Long term (current) use of anticoagulants: Secondary | ICD-10-CM

## 2012-10-25 DIAGNOSIS — I4891 Unspecified atrial fibrillation: Secondary | ICD-10-CM

## 2012-11-19 ENCOUNTER — Ambulatory Visit (INDEPENDENT_AMBULATORY_CARE_PROVIDER_SITE_OTHER): Payer: PRIVATE HEALTH INSURANCE | Admitting: *Deleted

## 2012-11-19 DIAGNOSIS — I2589 Other forms of chronic ischemic heart disease: Secondary | ICD-10-CM

## 2012-11-19 DIAGNOSIS — Z95810 Presence of automatic (implantable) cardiac defibrillator: Secondary | ICD-10-CM

## 2012-11-19 DIAGNOSIS — I255 Ischemic cardiomyopathy: Secondary | ICD-10-CM

## 2012-11-21 LAB — REMOTE ICD DEVICE
AL AMPLITUDE: 2 mv
BAMS-0001: 135 {beats}/min
BATTERY VOLTAGE: 2.8824 V
FVT: 0
LV LEAD IMPEDENCE ICD: 950 Ohm
RV LEAD IMPEDENCE ICD: 532 Ohm
TOT-0006: 20100818000000
TZAT-0001ATACH: 2
TZAT-0001SLOWVT: 1
TZAT-0001SLOWVT: 2
TZAT-0002ATACH: NEGATIVE
TZAT-0002ATACH: NEGATIVE
TZAT-0004FASTVT: 8
TZAT-0004FASTVT: 8
TZAT-0004SLOWVT: 8
TZAT-0005FASTVT: 88 pct
TZAT-0005FASTVT: 91 pct
TZAT-0011FASTVT: 10 ms
TZAT-0011FASTVT: 10 ms
TZAT-0012FASTVT: 200 ms
TZAT-0012FASTVT: 200 ms
TZAT-0013FASTVT: 2
TZAT-0013FASTVT: 2
TZAT-0013SLOWVT: 5
TZAT-0013SLOWVT: 5
TZAT-0018ATACH: NEGATIVE
TZAT-0018FASTVT: NEGATIVE
TZAT-0018SLOWVT: NEGATIVE
TZAT-0018SLOWVT: NEGATIVE
TZAT-0019ATACH: 6 V
TZAT-0019ATACH: 6 V
TZAT-0019ATACH: 6 V
TZAT-0020SLOWVT: 1.5 ms
TZAT-0020SLOWVT: 1.5 ms
TZON-0003FASTVT: 270 ms
TZON-0003VSLOWVT: 640 ms
TZON-0004SLOWVT: 100
TZON-0004VSLOWVT: 20
TZON-0005SLOWVT: 12
TZST-0001ATACH: 5
TZST-0001FASTVT: 3
TZST-0001FASTVT: 4
TZST-0001FASTVT: 6
TZST-0001SLOWVT: 3
TZST-0002ATACH: NEGATIVE
TZST-0002SLOWVT: NEGATIVE
TZST-0003FASTVT: 35 J
VF: 1

## 2012-12-12 ENCOUNTER — Encounter: Payer: Self-pay | Admitting: *Deleted

## 2012-12-17 ENCOUNTER — Encounter: Payer: Self-pay | Admitting: Internal Medicine

## 2012-12-21 ENCOUNTER — Other Ambulatory Visit: Payer: Self-pay | Admitting: Cardiology

## 2013-02-15 ENCOUNTER — Other Ambulatory Visit: Payer: Self-pay | Admitting: Cardiology

## 2013-02-18 ENCOUNTER — Ambulatory Visit: Payer: PRIVATE HEALTH INSURANCE | Admitting: Cardiology

## 2013-02-25 ENCOUNTER — Encounter: Payer: PRIVATE HEALTH INSURANCE | Admitting: *Deleted

## 2013-03-01 ENCOUNTER — Other Ambulatory Visit: Payer: Self-pay | Admitting: *Deleted

## 2013-03-01 ENCOUNTER — Encounter: Payer: Self-pay | Admitting: *Deleted

## 2013-03-01 MED ORDER — FUROSEMIDE 40 MG PO TABS: 40.0000 mg | ORAL_TABLET | Freq: Two times a day (BID) | ORAL | Status: DC

## 2013-03-18 ENCOUNTER — Encounter: Payer: Self-pay | Admitting: Internal Medicine

## 2013-03-18 ENCOUNTER — Ambulatory Visit (INDEPENDENT_AMBULATORY_CARE_PROVIDER_SITE_OTHER): Payer: PRIVATE HEALTH INSURANCE | Admitting: *Deleted

## 2013-03-18 DIAGNOSIS — I255 Ischemic cardiomyopathy: Secondary | ICD-10-CM

## 2013-03-18 DIAGNOSIS — I2589 Other forms of chronic ischemic heart disease: Secondary | ICD-10-CM

## 2013-03-18 DIAGNOSIS — I472 Ventricular tachycardia, unspecified: Secondary | ICD-10-CM

## 2013-03-18 DIAGNOSIS — I5022 Chronic systolic (congestive) heart failure: Secondary | ICD-10-CM

## 2013-03-18 DIAGNOSIS — Z95810 Presence of automatic (implantable) cardiac defibrillator: Secondary | ICD-10-CM

## 2013-03-18 LAB — MDC_IDC_ENUM_SESS_TYPE_REMOTE
Battery Voltage: 2.75 V
Brady Statistic AP VP Percent: 99.99 %
Brady Statistic AP VS Percent: 0 %
Brady Statistic AS VS Percent: 0 %
Brady Statistic RA Percent Paced: 99.99 %
Brady Statistic RV Percent Paced: 100 %
Date Time Interrogation Session: 20141117190525
HighPow Impedance: 43 Ohm
HighPow Impedance: 55 Ohm
Lead Channel Impedance Value: 418 Ohm
Lead Channel Impedance Value: 437 Ohm
Lead Channel Impedance Value: 494 Ohm
Lead Channel Impedance Value: 551 Ohm
Lead Channel Sensing Intrinsic Amplitude: 19.75 mV
Lead Channel Sensing Intrinsic Amplitude: 3.375 mV
Lead Channel Setting Pacing Amplitude: 2.5 V
Lead Channel Setting Pacing Amplitude: 2.75 V
Lead Channel Setting Pacing Pulse Width: 0.4 ms
Lead Channel Setting Pacing Pulse Width: 0.4 ms
Lead Channel Setting Sensing Sensitivity: 0.3 mV
Zone Setting Detection Interval: 370 ms
Zone Setting Detection Interval: 450 ms
Zone Setting Detection Interval: 630 ms
Zone Setting Detection Interval: 640 ms

## 2013-03-25 ENCOUNTER — Ambulatory Visit (INDEPENDENT_AMBULATORY_CARE_PROVIDER_SITE_OTHER): Payer: PRIVATE HEALTH INSURANCE | Admitting: Cardiology

## 2013-03-25 ENCOUNTER — Encounter: Payer: Self-pay | Admitting: Cardiology

## 2013-03-25 VITALS — BP 115/82 | HR 75 | Ht 62.0 in | Wt 171.0 lb

## 2013-03-25 DIAGNOSIS — I4891 Unspecified atrial fibrillation: Secondary | ICD-10-CM

## 2013-03-25 LAB — CBC WITH DIFFERENTIAL/PLATELET
Basophils Relative: 0.3 % (ref 0.0–3.0)
Eosinophils Relative: 1.4 % (ref 0.0–5.0)
HCT: 45.8 % (ref 39.0–52.0)
Lymphocytes Relative: 27.7 % (ref 12.0–46.0)
Monocytes Relative: 9.5 % (ref 3.0–12.0)
Neutrophils Relative %: 61.1 % (ref 43.0–77.0)
Platelets: 183 10*3/uL (ref 150.0–400.0)
RBC: 4.68 Mil/uL (ref 4.22–5.81)
WBC: 10.2 10*3/uL (ref 4.5–10.5)

## 2013-03-25 LAB — LIPID PANEL
HDL: 29.3 mg/dL — ABNORMAL LOW (ref >39.00)
Total CHOL/HDL Ratio: 5
VLDL: 26 mg/dL (ref 0.0–40.0)

## 2013-03-25 LAB — HEPATIC FUNCTION PANEL
AST: 25 U/L (ref 0–37)
Albumin: 3.9 g/dL (ref 3.5–5.2)
Alkaline Phosphatase: 87 U/L (ref 39–117)
Bilirubin, Direct: 0.1 mg/dL (ref 0.0–0.3)
Total Protein: 7.4 g/dL (ref 6.0–8.3)

## 2013-03-25 LAB — BASIC METABOLIC PANEL
CO2: 26 mEq/L (ref 19–32)
Calcium: 8.9 mg/dL (ref 8.4–10.5)
Chloride: 102 mEq/L (ref 96–112)
GFR: 66.28 mL/min (ref >60.00)
Glucose, Bld: 100 mg/dL — ABNORMAL HIGH (ref 70–99)
Potassium: 3.6 mEq/L (ref 3.5–5.1)
Sodium: 136 mEq/L (ref 135–145)

## 2013-03-25 LAB — TSH: TSH: 0.71 u[IU]/mL (ref 0.35–5.50)

## 2013-03-25 NOTE — Assessment & Plan Note (Signed)
Continue amiodarone. 

## 2013-03-25 NOTE — Progress Notes (Signed)
HPI: [name] is a pleasant gentleman with a history of coronary artery disease, ischemic cardiomyopathy, history of ICD, history of ventricular tachycardia status post ablation, history of atrial fibrillation on amiodarone therapy. His last echocardiogram was performed in July of 2010 and revealed an EF of 20-25 and mild MR. Last cath performed in July of 2010 and revealed an occluded RCA but nonobstructive disease in the Lcx and LAD other than ostial 80 in a small marginal; EF 10-15. He has had CRT and VT ablations. Myoview in April of 2013 showed an ejection fraction of 14%. There was scar but no ischemia. I last saw him in April 2014. Since then, the patient has dyspnea with more extreme activities but not with routine activities. It is relieved with rest. It is not associated with chest pain. There is no orthopnea, PND. There is no syncope or palpitations. There is no exertional chest pain. Occasional mild pedal edema.   Current Outpatient Prescriptions  Medication Sig Dispense Refill  . amiodarone (PACERONE) 200 MG tablet Take 1 tablet AM and 1/2 tablet Monday AND FRIDAY PM      . atorvastatin (LIPITOR) 80 MG tablet TAKE 1 TABLET BY MOUTH ONCE A DAY  90 tablet  3  . carvedilol (COREG) 12.5 MG tablet TAKE 1 TABLET BY MOUTH TWICE A DAY  60 tablet  3  . digoxin 0.0625 MG TABS Take 0.0625 mg by mouth daily.  30 tablet  12  . furosemide (LASIX) 40 MG tablet Take 1 tablet (40 mg total) by mouth 2 (two) times daily.  60 tablet  0  . pantoprazole (PROTONIX) 40 MG tablet Take 1 tablet by mouth Daily.      . ramipril (ALTACE) 2.5 MG capsule TAKE ONE CAPSULE BY MOUTH TWICE A DAY  120 capsule  0  . warfarin (COUMADIN) 2.5 MG tablet Take 1 tablet (2.5 mg total) by mouth as directed.  30 tablet  3   No current facility-administered medications for this visit.     Past Medical History  Diagnosis Date  . HYPERTHYROIDISM   . HYPERLIPIDEMIA-MIXED   . OBESITY-MORBID (>100')   . CAD   . AV  BLOCK, COMPLETE   . VENTRICULAR TACHYCARDIA     S/P RFCA x3  2006, 2010,2010  . Atrial fibrillation   . Chronic systolic heart failure   . TRANSAMINASES, SERUM, ELEVATED   . Long term current use of anticoagulant   . Ischemic cardiomyopathy   . Implantable cardiac defibrillator  medtronic     Initially related 1990s, upgrade to dual-chamber 2002, generator change 2005, generator change August 2010, hematoma evacuation September 2010.  [name] Gynecomastia     Past Surgical History  Procedure Laterality Date  . Splenectomy    . Status post medtronic concerto crt-d in august 2010 with pocket revision      History   Social History  . Marital Status: Divorced    Spouse Name: N/A    Number of Children: N/A  . Years of Education: N/A   Occupational History  . Not on file.   Social History Main Topics  . Smoking status: Former Games developer  . Smokeless tobacco: Never Used  . Alcohol Use: Yes  . Drug Use: No  . Sexual Activity: Not on file   Other Topics Concern  . Not on file   Social History Narrative  . No narrative on file    ROS: no fevers or chills, productive cough, hemoptysis, dysphasia, odynophagia, melena, hematochezia,  dysuria, hematuria, rash, seizure activity, orthopnea, PND, pedal edema, claudication. Remaining systems are negative.  Physical Exam: Well-developed well-nourished in no acute distress.  Skin is warm and dry.  HEENT is normal.  Neck is supple.  Chest is clear to auscultation with normal expansion. ICD left chest Cardiovascular exam is regular rate and rhythm.  Abdominal exam nontender or distended. No masses palpated. Extremities show no edema. neuro grossly intact  ECG AV pacing.

## 2013-03-25 NOTE — Assessment & Plan Note (Signed)
Followed by electrophysiology. 

## 2013-03-25 NOTE — Assessment & Plan Note (Signed)
Continue ACE inhibitor and beta blocker. 

## 2013-03-25 NOTE — Assessment & Plan Note (Signed)
Continue statin. Check lipids

## 2013-03-25 NOTE — Assessment & Plan Note (Signed)
Continue amiodarone. Check TSH, liver functions and chest x-ray. Continue Coumadin. Check hemoglobin. Patient given the name of apixaban. He will check with his insurance company to see if it is covered. If so we will change Coumadin to this medication.

## 2013-03-25 NOTE — Assessment & Plan Note (Signed)
Continue present days of Lasix. Euvolemic on examination.

## 2013-03-25 NOTE — Patient Instructions (Signed)
Your physician wants you to follow-up in: 6 MONTHS WITH DR [name] You will receive a reminder letter in the mail two months in advance. If you don't receive a letter, please call our office to schedule the follow-up appointment.   Your physician recommends that you HAVE LAB WORK TODAY  A chest x-ray takes a picture of the organs and structures inside the chest, including the heart, lungs, and blood vessels. This test can show several things, including, whether the heart is enlarges; whether fluid is building up in the lungs; and whether pacemaker / defibrillator leads are still in place. AT [facility]

## 2013-03-25 NOTE — Assessment & Plan Note (Signed)
Continue statin. Not on aspirin given need for Coumadin. 

## 2013-03-26 ENCOUNTER — Encounter: Payer: Self-pay | Admitting: *Deleted

## 2013-04-01 ENCOUNTER — Telehealth: Payer: Self-pay | Admitting: Cardiology

## 2013-04-01 NOTE — Telephone Encounter (Signed)
New problem    Patient was told to call back regarding insurance information    Insurance will pay for new medication - eliquis

## 2013-04-01 NOTE — Telephone Encounter (Signed)
Patients last INR was in June. Discussed with Tiffany in the [facility]. Patient needs INR and will determine how long to hold Coumadin before starting Eliquis from those results. Tiffany spoke with patient and advised him to call  office to schedule INR. Discussed with Dr [name] and ok to start Eliquis 5 mg twice a day.

## 2013-04-01 NOTE — Telephone Encounter (Signed)
Left message to call back  

## 2013-04-04 ENCOUNTER — Ambulatory Visit (HOSPITAL_COMMUNITY)
Admission: RE | Admit: 2013-04-04 | Discharge: 2013-04-04 | Disposition: A | Discharge status: Home / Self Care | Payer: Medicare (Managed Care) | Source: Ambulatory Visit | Attending: Cardiology | Admitting: Cardiology

## 2013-04-04 ENCOUNTER — Ambulatory Visit (INDEPENDENT_AMBULATORY_CARE_PROVIDER_SITE_OTHER): Payer: Medicare (Managed Care) | Admitting: *Deleted

## 2013-04-04 DIAGNOSIS — Z7901 Long term (current) use of anticoagulants: Secondary | ICD-10-CM

## 2013-04-04 DIAGNOSIS — I4891 Unspecified atrial fibrillation: Secondary | ICD-10-CM

## 2013-04-04 DIAGNOSIS — Z950 Presence of cardiac pacemaker: Secondary | ICD-10-CM | POA: Insufficient documentation

## 2013-04-04 LAB — POCT INR: INR: 2

## 2013-04-04 MED ORDER — APIXABAN 5 MG PO TABS: 5.0000 mg | ORAL_TABLET | Freq: Two times a day (BID) | ORAL | Status: DC

## 2013-04-04 NOTE — Patient Instructions (Signed)
Pt was started on Eliquis for Atrial Fib on 04/05/13.    Reviewed patients medication list.  Pt is not currently on any combined P-gp and strong CYP3A4 inhibitors/inducers (ketoconazole, traconazole, ritonavir, carbamazepine, phenytoin, rifampin, St. [name]'s wort).  Reviewed labs from 03/25/13.  SCr 1.2  Weight 175, CrCl- 76.25.  Dose appropriate based on CrCl.   Hgb and HCT 15.3/45.8  A full discussion of the nature of anticoagulants has been carried out.  A benefit/risk analysis has been presented to the patient, so that they understand the justification for choosing anticoagulation with Eliquis at this time.  The need for compliance is stressed.  Pt is aware to take the medication twice daily.  Side effects of potential bleeding are discussed, including unusual colored urine or stools, coughing up blood or coffee ground emesis, nose bleeds or serious fall or head trauma.  Discussed signs and symptoms of stroke. The patient should avoid any OTC items containing aspirin or ibuprofen.  Avoid alcohol consumption.   Call if any signs of abnormal bleeding.  Discussed financial obligations and resolved any difficulty in obtaining medication.  Next lab test test in 1 months.

## 2013-04-05 NOTE — Telephone Encounter (Signed)
Rx sent in by Roderick Pee RN

## 2013-04-09 ENCOUNTER — Encounter: Payer: Self-pay | Admitting: *Deleted

## 2013-04-16 ENCOUNTER — Other Ambulatory Visit: Payer: Self-pay | Admitting: Cardiology

## 2013-05-05 ENCOUNTER — Other Ambulatory Visit: Payer: Self-pay | Admitting: Cardiology

## 2013-05-07 ENCOUNTER — Ambulatory Visit: Payer: Self-pay | Admitting: *Deleted

## 2013-05-07 DIAGNOSIS — I4891 Unspecified atrial fibrillation: Secondary | ICD-10-CM

## 2013-05-07 DIAGNOSIS — Z7901 Long term (current) use of anticoagulants: Secondary | ICD-10-CM

## 2013-05-08 ENCOUNTER — Ambulatory Visit (INDEPENDENT_AMBULATORY_CARE_PROVIDER_SITE_OTHER): Payer: Medicare (Managed Care) | Admitting: *Deleted

## 2013-05-08 DIAGNOSIS — I4891 Unspecified atrial fibrillation: Secondary | ICD-10-CM

## 2013-05-08 DIAGNOSIS — Z7901 Long term (current) use of anticoagulants: Secondary | ICD-10-CM

## 2013-05-08 LAB — CBC
HCT: 44.3 % (ref 39.0–52.0)
Hemoglobin: 15.5 g/dL (ref 13.0–17.0)
MCH: 32.8 pg (ref 26.0–34.0)
MCHC: 35 g/dL (ref 30.0–36.0)
MCV: 93.7 fL (ref 78.0–100.0)
PLATELETS: 214 10*3/uL (ref 150–400)
RBC: 4.73 MIL/uL (ref 4.22–5.81)
RDW: 14.8 % (ref 11.5–15.5)
WBC: 9.4 10*3/uL (ref 4.0–10.5)

## 2013-05-08 NOTE — Patient Instructions (Addendum)
Pt was started on Eliquis for atrial fib on 04/05/13.    Reviewed patients medication list.  Pt is not currently on any combined P-gp and strong CYP3A4 inhibitors/inducers (ketoconazole, traconazole, ritonavir, carbamazepine, phenytoin, rifampin, St. [name]'s wort).  Reviewed labs from 05/08/13.  SCr 1.13, Weight 171.4, CrCl- 79.31.  Dose:  Eliquis 5mg  bid is appropriate based on CrCl.   Hgb and HCT 15.3/44.3.  A full discussion of the nature of anticoagulants has been carried out.  A benefit/risk analysis has been presented to the patient, so that they understand the justification for choosing anticoagulation with Xarelto at this time.  The need for compliance is stressed.  Pt is aware to take the medication once daily with the largest meal of the day.  Side effects of potential bleeding are discussed, including unusual colored urine or stools, coughing up blood or coffee ground emesis, nose bleeds or serious fall or head trauma.  Discussed signs and symptoms of stroke. The patient should avoid any OTC items containing aspirin or ibuprofen.  Avoid alcohol consumption.   Call if any signs of abnormal bleeding.  Discussed financial obligations and resolved any difficulty in obtaining medication.  Next lab test test in 6 months.

## 2013-05-09 LAB — BASIC METABOLIC PANEL
BUN: 14 mg/dL (ref 6–23)
CO2: 29 mEq/L (ref 19–32)
CREATININE: 1.13 mg/dL (ref 0.50–1.35)
Calcium: 9.1 mg/dL (ref 8.4–10.5)
Chloride: 104 mEq/L (ref 96–112)
GLUCOSE: 93 mg/dL (ref 70–99)
POTASSIUM: 4.2 meq/L (ref 3.5–5.3)
Sodium: 139 mEq/L (ref 135–145)

## 2013-05-20 ENCOUNTER — Other Ambulatory Visit: Payer: Self-pay | Admitting: Cardiology

## 2013-05-29 ENCOUNTER — Telehealth: Payer: Self-pay

## 2013-05-29 NOTE — Telephone Encounter (Signed)
Discussed with pt.  States Rt arm is sore near his elbow. No lumps or knots.  Not red, inflamed or bruised.  No change in temperature of arm or hand from other arm.  Told pt this does not sound like side effect of Elquis.  Pt to continue to monitor symptoms and to call PCP if this continues.  Pt is in agreement.

## 2013-05-29 NOTE — Telephone Encounter (Signed)
Patient asked Lattie Haw to call him back about medication Eliquis and side effects.  Patient states lower arm on Right side is sore.

## 2013-06-10 ENCOUNTER — Other Ambulatory Visit: Payer: Self-pay | Admitting: Cardiology

## 2013-06-19 ENCOUNTER — Ambulatory Visit (INDEPENDENT_AMBULATORY_CARE_PROVIDER_SITE_OTHER): Payer: Medicare (Managed Care) | Admitting: *Deleted

## 2013-06-19 DIAGNOSIS — I442 Atrioventricular block, complete: Secondary | ICD-10-CM

## 2013-06-19 DIAGNOSIS — I4891 Unspecified atrial fibrillation: Secondary | ICD-10-CM

## 2013-06-19 DIAGNOSIS — I5022 Chronic systolic (congestive) heart failure: Secondary | ICD-10-CM

## 2013-06-20 LAB — MDC_IDC_ENUM_SESS_TYPE_REMOTE
Battery Voltage: 2.65 V
Brady Statistic AP VP Percent: 99.99 %
Brady Statistic AP VS Percent: 0 %
Brady Statistic AS VP Percent: 0.01 %
Brady Statistic RV Percent Paced: 100 %
HighPow Impedance: 42 Ohm
HighPow Impedance: 53 Ohm
Lead Channel Impedance Value: 475 Ohm
Lead Channel Impedance Value: 855 Ohm
Lead Channel Sensing Intrinsic Amplitude: 19.75 mV
Lead Channel Setting Pacing Amplitude: 2 V
Lead Channel Setting Pacing Amplitude: 2.75 V
Lead Channel Setting Pacing Pulse Width: 0.4 ms
Lead Channel Setting Sensing Sensitivity: 0.3 mV
MDC IDC MSMT LEADCHNL LV IMPEDANCE VALUE: 437 Ohm
MDC IDC MSMT LEADCHNL LV IMPEDANCE VALUE: 532 Ohm
MDC IDC MSMT LEADCHNL RA IMPEDANCE VALUE: 418 Ohm
MDC IDC MSMT LEADCHNL RA SENSING INTR AMPL: 3.375 mV
MDC IDC SESS DTM: 20150219162258
MDC IDC SET LEADCHNL LV PACING AMPLITUDE: 2.5 V
MDC IDC SET LEADCHNL LV PACING PULSEWIDTH: 0.4 ms
MDC IDC STAT BRADY AS VS PERCENT: 0 %
MDC IDC STAT BRADY RA PERCENT PACED: 99.99 %
Zone Setting Detection Interval: 270 ms
Zone Setting Detection Interval: 370 ms
Zone Setting Detection Interval: 450 ms
Zone Setting Detection Interval: 630 ms
Zone Setting Detection Interval: 640 ms

## 2013-07-11 ENCOUNTER — Other Ambulatory Visit: Payer: Self-pay | Admitting: Cardiology

## 2013-07-22 ENCOUNTER — Telehealth: Payer: Self-pay | Admitting: *Deleted

## 2013-07-22 ENCOUNTER — Encounter: Payer: Self-pay | Admitting: *Deleted

## 2013-07-22 MED ORDER — RAMIPRIL 2.5 MG PO CAPS: 2.5000 mg | ORAL_CAPSULE | Freq: Two times a day (BID) | ORAL | Status: DC

## 2013-07-22 NOTE — Telephone Encounter (Signed)
Patient called in refill

## 2013-07-30 ENCOUNTER — Encounter: Payer: Self-pay | Admitting: Internal Medicine

## 2013-08-21 ENCOUNTER — Other Ambulatory Visit: Payer: Self-pay | Admitting: Cardiology

## 2013-08-27 ENCOUNTER — Ambulatory Visit (INDEPENDENT_AMBULATORY_CARE_PROVIDER_SITE_OTHER): Payer: Medicare (Managed Care) | Admitting: Internal Medicine

## 2013-08-27 ENCOUNTER — Encounter: Payer: Self-pay | Admitting: Internal Medicine

## 2013-08-27 VITALS — BP 109/66 | HR 75 | Ht 62.0 in | Wt 179.0 lb

## 2013-08-27 DIAGNOSIS — I2589 Other forms of chronic ischemic heart disease: Secondary | ICD-10-CM

## 2013-08-27 DIAGNOSIS — I442 Atrioventricular block, complete: Secondary | ICD-10-CM

## 2013-08-27 DIAGNOSIS — I4729 Other ventricular tachycardia: Secondary | ICD-10-CM

## 2013-08-27 DIAGNOSIS — Z01812 Encounter for preprocedural laboratory examination: Secondary | ICD-10-CM

## 2013-08-27 DIAGNOSIS — I472 Ventricular tachycardia: Secondary | ICD-10-CM

## 2013-08-27 DIAGNOSIS — I5022 Chronic systolic (congestive) heart failure: Secondary | ICD-10-CM

## 2013-08-27 DIAGNOSIS — Z95810 Presence of automatic (implantable) cardiac defibrillator: Secondary | ICD-10-CM

## 2013-08-27 DIAGNOSIS — I255 Ischemic cardiomyopathy: Secondary | ICD-10-CM

## 2013-08-27 DIAGNOSIS — I4891 Unspecified atrial fibrillation: Secondary | ICD-10-CM

## 2013-08-27 LAB — MDC_IDC_ENUM_SESS_TYPE_INCLINIC
Brady Statistic AP VP Percent: 99.99 %
Brady Statistic AP VS Percent: 0 %
Brady Statistic AS VP Percent: 0.01 %
Brady Statistic AS VS Percent: 0 %
Brady Statistic RV Percent Paced: 100 %
Date Time Interrogation Session: 20150428174239
HIGH POWER IMPEDANCE MEASURED VALUE: 60 Ohm
HighPow Impedance: 47 Ohm
Lead Channel Impedance Value: 418 Ohm
Lead Channel Impedance Value: 475 Ohm
Lead Channel Impedance Value: 551 Ohm
Lead Channel Impedance Value: 855 Ohm
Lead Channel Pacing Threshold Amplitude: 0.5 V
Lead Channel Pacing Threshold Pulse Width: 0.4 ms
Lead Channel Pacing Threshold Pulse Width: 0.4 ms
Lead Channel Sensing Intrinsic Amplitude: 19.75 mV
Lead Channel Sensing Intrinsic Amplitude: 2 mV
Lead Channel Setting Pacing Amplitude: 2 V
Lead Channel Setting Pacing Amplitude: 2.5 V
Lead Channel Setting Pacing Pulse Width: 0.4 ms
Lead Channel Setting Sensing Sensitivity: 0.3 mV
MDC IDC MSMT BATTERY VOLTAGE: 2.62 V
MDC IDC MSMT LEADCHNL LV PACING THRESHOLD AMPLITUDE: 1 V
MDC IDC MSMT LEADCHNL LV PACING THRESHOLD PULSEWIDTH: 0.4 ms
MDC IDC MSMT LEADCHNL RV IMPEDANCE VALUE: 494 Ohm
MDC IDC MSMT LEADCHNL RV PACING THRESHOLD AMPLITUDE: 1.5 V
MDC IDC MSMT LEADCHNL RV SENSING INTR AMPL: 19.75 mV
MDC IDC SET LEADCHNL LV PACING PULSEWIDTH: 0.4 ms
MDC IDC SET LEADCHNL RV PACING AMPLITUDE: 2.75 V
MDC IDC SET ZONE DETECTION INTERVAL: 450 ms
MDC IDC SET ZONE DETECTION INTERVAL: 640 ms
MDC IDC STAT BRADY RA PERCENT PACED: 99.99 %
Zone Setting Detection Interval: 270 ms
Zone Setting Detection Interval: 370 ms
Zone Setting Detection Interval: 630 ms

## 2013-08-27 NOTE — Patient Instructions (Signed)
Your physician has recommended you make the following change in your medication:  1) CHANGE LASIX for 10 days -- take 80 mg in the morning, 40 mg at night ---- after 10 days return to normal dosing.  You will have monthly checks on your device

## 2013-08-27 NOTE — Progress Notes (Signed)
Patient Care Team: No Pcp Per Patient as PCP - General (General Practice)   HPI  Michael Krueger is a 58 y.o. male seen in followup for ventricular tachycardia status post ablation. He also has a history of atrial fibrillation and he takes amiodarone for the combination of the 2.  He is status post a CRT-D implantation for congestive heart failure in the setting of ischemic heart cardiomyopathy   His last echocardiogram was performed in July of 2010 and revealed an EF of 20-25 and mild MR .  Last cath performed in July of 2010 and revealed an occluded RCA but nonobstructive disease in the Lcx and LAD other than ostial 80 in a small marginal; EF 10-15. He has had CRT and VT ablations    Amiodarone surveillance laboratories were normal Nov 14 Most recently his amiodarone dose was 200 mg every morning and 200 mg every afternoon MWF      The patient denies chest pain, shortness of breath, nocturnal dyspnea, orthopnea or peripheral edema. There have been no palpitations, lightheadedness or syncope.    Past Medical History  Diagnosis Date  . HYPERTHYROIDISM   . HYPERLIPIDEMIA-MIXED   . OBESITY-MORBID (>100')   . CAD   . AV BLOCK, COMPLETE   . VENTRICULAR TACHYCARDIA     S/P RFCA x3  2006, 2010,2010  . Atrial fibrillation   . Chronic systolic heart failure   . TRANSAMINASES, SERUM, ELEVATED   . Long term current use of anticoagulant   . Ischemic cardiomyopathy   . Implantable cardiac defibrillator  medtronic     Initially related 1990s, upgrade to dual-chamber 2002, generator change 2005, generator change August 2010, hematoma evacuation September 2010.  [name] Gynecomastia     Past Surgical History  Procedure Laterality Date  . Splenectomy    . Status post medtronic concerto crt-d in august 2010 with pocket revision      Current Outpatient Prescriptions  Medication Sig Dispense Refill  . amiodarone (PACERONE) 200 MG tablet TAKE 1 TABLET BY MOUTH EVERY MORNING        . atorvastatin (LIPITOR) 80 MG tablet TAKE 1 TABLET BY MOUTH ONCE A DAY  90 tablet  3  . carvedilol (COREG) 12.5 MG tablet TAKE 1 TABLET BY MOUTH TWICE DAILY  60 tablet  2  . digoxin 0.0625 MG TABS Take 0.0625 mg by mouth daily.  30 tablet  12  . ELIQUIS 5 MG TABS tablet TAKE 1 TABLET BY MOUTH TWICE DAILY  60 tablet  0  . furosemide (LASIX) 40 MG tablet TAKE 1 TABLET BY MOUTH TWICE DAILY  60 tablet  3  . pantoprazole (PROTONIX) 40 MG tablet Take 1 tablet by mouth Daily.      . ramipril (ALTACE) 2.5 MG capsule Take 1 capsule (2.5 mg total) by mouth 2 (two) times daily.  180 capsule  1  . traZODone (DESYREL) 50 MG tablet daily.       No current facility-administered medications for this visit.    No Known Allergies  Review of Systems negative except from HPI and PMH  Physical Exam BP 109/66  Pulse 75  Ht 5\' 2"  (1.575 m)  Wt 179 lb (81.194 kg)  BMI 32.73 kg/m2 Well developed and well nourished in no acute distress HENT normal E scleral and icterus clear Neck Supple JVP 8-10; carotids brisk and full Clear to ausculation  Regular rate and rhythm, no murmurs gallops or rub Soft with active bowel sounds No clubbing cyanosis  1+ Edema Alert and oriented, grossly normal motor and sensory function Skin Warm and Dry  ECG demonstrates sinus rhythm with a synchronous pacing at a QRS duration 100 ms  We undertook ECG evaluation at LV+50, +30, 0, RV +30, +50  The narrowest QRS appeared to be an RV +30. Interestingly the axis was not all that different. Assessment and  Plan  Ventricular tachycardia  Ischemic cardio myopathy  Congestive heart failure acute on chronic systolic  CRT-D-Medtronic  The patient's device was interrogated and the information was fully reviewed.  The device was reprogrammed to narrow the QRS duration. This was accomplished by an RV offset of +40  We'll increase his diuretics were 40/40--80/40 for 10 days. There is evidence of volume overload. He'll let us  know in about 10 days how is feeling.  His device has reached ERI. We'll check surveillance laboratories at the time of his preoperative blood testing

## 2013-09-05 ENCOUNTER — Other Ambulatory Visit: Payer: Self-pay | Admitting: Internal Medicine

## 2013-09-06 ENCOUNTER — Encounter: Payer: Self-pay | Admitting: Internal Medicine

## 2013-09-11 ENCOUNTER — Other Ambulatory Visit: Payer: Self-pay | Admitting: Cardiology

## 2013-09-18 ENCOUNTER — Telehealth: Payer: Self-pay | Admitting: Internal Medicine

## 2013-09-18 NOTE — Telephone Encounter (Signed)
New Message:  PT is asking how long would he be out of work when he has his defib replaced.Marland KitchenMarland Kitchen

## 2013-09-19 NOTE — Telephone Encounter (Signed)
Discussed working, procedure, lifting restrictions. Advised that he is able to return to work post wound check (w/ no complications)

## 2013-09-24 ENCOUNTER — Ambulatory Visit (INDEPENDENT_AMBULATORY_CARE_PROVIDER_SITE_OTHER): Payer: Medicare (Managed Care) | Admitting: Cardiology

## 2013-09-24 ENCOUNTER — Encounter: Payer: Self-pay | Admitting: Cardiology

## 2013-09-24 VITALS — BP 120/62 | HR 76 | Ht 62.0 in | Wt 175.0 lb

## 2013-09-24 DIAGNOSIS — E785 Hyperlipidemia, unspecified: Secondary | ICD-10-CM

## 2013-09-24 DIAGNOSIS — I472 Ventricular tachycardia: Secondary | ICD-10-CM

## 2013-09-24 DIAGNOSIS — I4891 Unspecified atrial fibrillation: Secondary | ICD-10-CM

## 2013-09-24 DIAGNOSIS — I509 Heart failure, unspecified: Secondary | ICD-10-CM

## 2013-09-24 DIAGNOSIS — I4729 Other ventricular tachycardia: Secondary | ICD-10-CM

## 2013-09-24 DIAGNOSIS — I2510 Atherosclerotic heart disease of native coronary artery without angina pectoris: Secondary | ICD-10-CM

## 2013-09-24 NOTE — Assessment & Plan Note (Signed)
Patient is having worsening CHF symptoms. Change Lasix to 40 mg twice a day. In one week check potassium, renal function and BNP.

## 2013-09-24 NOTE — Assessment & Plan Note (Signed)
Continue amiodarone. Check TSH, liver functions and chest x-ray.

## 2013-09-24 NOTE — Assessment & Plan Note (Signed)
Continue statin. 

## 2013-09-24 NOTE — Patient Instructions (Signed)
Your physician recommends that you schedule a follow-up appointment in: [place] TO 107 MG TWICE DAILY  Your physician recommends that you return for lab work in: [place]  A chest x-ray takes a picture of the organs and structures inside the chest, including the heart, lungs, and blood vessels. This test can show several things, including, whether the heart is enlarges; whether fluid is building up in the lungs; and whether pacemaker / defibrillator leads are still in place. AT [place] OFFICE

## 2013-09-24 NOTE — Assessment & Plan Note (Signed)
Continue ACE inhibitor and beta blocker. 

## 2013-09-24 NOTE — Assessment & Plan Note (Signed)
Continue statin. Not on aspirin given need for anticoagulation. 

## 2013-09-24 NOTE — Assessment & Plan Note (Signed)
Nearing ERI. Managed by elective physiology.

## 2013-09-24 NOTE — Progress Notes (Signed)
HPI: FU coronary artery disease, ischemic cardiomyopathy, history of ICD, history of ventricular tachycardia status post ablation, history of atrial fibrillation on amiodarone therapy. His last echocardiogram was performed in July of 2010 and revealed an EF of 20-25 and mild MR. Last cath performed in July of 2010 and revealed an occluded RCA but nonobstructive disease in the Lcx and LAD other than ostial 80 in a small marginal; EF 10-15. He has had CRT and VT ablations. Myoview in April of 2013 showed an ejection fraction of 14%. There was scar but no ischemia. I last saw him in Nov 2014. Since then, Note some increased dyspnea on exertion and PND. Occasional mild increased pedal edema. No chest pain or syncope.   Current Outpatient Prescriptions  Medication Sig Dispense Refill  . acetaminophen (TYLENOL) 500 MG tablet Take 500 mg by mouth as needed.      [name] amiodarone (PACERONE) 200 MG tablet TAKE 1 TABLET BY MOUTH EVERY MORNING      . atorvastatin (LIPITOR) 80 MG tablet TAKE 1 TABLET BY MOUTH EVERY DAY  90 tablet  0  . carvedilol (COREG) 12.5 MG tablet TAKE 1 TABLET BY MOUTH TWICE DAILY  60 tablet  0  . DIGOX 125 MCG tablet TAKE 1/2 TABLET BY MOUTH EVERY DAY  15 tablet  0  . diphenhydrAMINE (BENADRYL) 25 mg capsule Take 25 mg by mouth every 6 (six) hours as needed.      [name] ELIQUIS 5 MG TABS tablet TAKE 1 TABLET BY MOUTH TWICE DAILY  60 tablet  0  . furosemide (LASIX) 40 MG tablet TAKE 1 TABLET BY MOUTH TWICE DAILY  60 tablet  0  . pantoprazole (PROTONIX) 40 MG tablet Take 1 tablet by mouth Daily.      . ramipril (ALTACE) 2.5 MG capsule Take 1 capsule (2.5 mg total) by mouth 2 (two) times daily.  180 capsule  1  . traZODone (DESYREL) 50 MG tablet Take 25-100 mg by mouth at bedtime as needed.        No current facility-administered medications for this visit.     Past Medical History  Diagnosis Date  . HYPERTHYROIDISM   . HYPERLIPIDEMIA-MIXED   . OBESITY-MORBID (>100')   . CAD   .  AV BLOCK, COMPLETE   . VENTRICULAR TACHYCARDIA     S/P RFCA x3  2006, 2010,2010  . Atrial fibrillation   . Chronic systolic heart failure   . TRANSAMINASES, SERUM, ELEVATED   . Long term current use of anticoagulant   . Ischemic cardiomyopathy   . Implantable cardiac defibrillator  medtronic     Initially related 1990s, upgrade to dual-chamber 2002, generator change 2005, generator change August 2010, hematoma evacuation September 2010.  [name] Gynecomastia     Past Surgical History  Procedure Laterality Date  . Splenectomy    . Status post medtronic concerto crt-d in august 2010 with pocket revision      History   Social History  . Marital Status: Divorced    Spouse Name: N/A    Number of Children: N/A  . Years of Education: N/A   Occupational History  . Not on file.   Social History Main Topics  . Smoking status: Former Research scientist (life sciences)  . Smokeless tobacco: Never Used  . Alcohol Use: Yes  . Drug Use: No  . Sexual Activity: Not on file   Other Topics Concern  . Not on file   Social History Narrative  . No narrative on file  ROS: no fevers or chills, productive cough, hemoptysis, dysphasia, odynophagia, melena, hematochezia, dysuria, hematuria, rash, seizure activity, orthopnea, PND, pedal edema, claudication. Remaining systems are negative.  Physical Exam: Well-developed well-nourished in no acute distress.  Skin is warm and dry.  HEENT is normal.  Neck is supple.  Chest is clear to auscultation with normal expansion.  Cardiovascular exam is regular rate and rhythm.  Abdominal exam nontender or distended. No masses palpated. Extremities show no edema. neuro grossly intact

## 2013-09-24 NOTE — Assessment & Plan Note (Signed)
Continue amiodarone and beta blocker. Continue apixaban. Check hemoglobin and renal function.

## 2013-09-26 ENCOUNTER — Ambulatory Visit (INDEPENDENT_AMBULATORY_CARE_PROVIDER_SITE_OTHER): Payer: Medicare (Managed Care) | Admitting: *Deleted

## 2013-09-26 DIAGNOSIS — Z95810 Presence of automatic (implantable) cardiac defibrillator: Secondary | ICD-10-CM

## 2013-09-26 NOTE — Progress Notes (Signed)
Remote ICD transmission.   

## 2013-09-27 ENCOUNTER — Other Ambulatory Visit: Payer: Self-pay | Admitting: Cardiology

## 2013-10-01 ENCOUNTER — Other Ambulatory Visit: Payer: Medicare (Managed Care)

## 2013-10-01 ENCOUNTER — Ambulatory Visit (INDEPENDENT_AMBULATORY_CARE_PROVIDER_SITE_OTHER)
Admission: RE | Admit: 2013-10-01 | Discharge: 2013-10-01 | Disposition: A | Discharge status: Home / Self Care | Payer: Medicare (Managed Care) | Source: Ambulatory Visit | Attending: Cardiology | Admitting: Cardiology

## 2013-10-01 ENCOUNTER — Other Ambulatory Visit (INDEPENDENT_AMBULATORY_CARE_PROVIDER_SITE_OTHER): Payer: Medicare (Managed Care)

## 2013-10-01 DIAGNOSIS — I4729 Other ventricular tachycardia: Secondary | ICD-10-CM

## 2013-10-01 DIAGNOSIS — I509 Heart failure, unspecified: Secondary | ICD-10-CM

## 2013-10-01 DIAGNOSIS — I472 Ventricular tachycardia: Secondary | ICD-10-CM

## 2013-10-01 LAB — HEPATIC FUNCTION PANEL
ALBUMIN: 3.9 g/dL (ref 3.5–5.2)
ALT: 38 U/L (ref 0–53)
AST: 37 U/L (ref 0–37)
Alkaline Phosphatase: 76 U/L (ref 39–117)
BILIRUBIN TOTAL: 1 mg/dL (ref 0.2–1.2)
Bilirubin, Direct: 0.1 mg/dL (ref 0.0–0.3)
Total Protein: 7.4 g/dL (ref 6.0–8.3)

## 2013-10-01 LAB — BASIC METABOLIC PANEL
BUN: 14 mg/dL (ref 6–23)
CALCIUM: 9.2 mg/dL (ref 8.4–10.5)
CO2: 30 mEq/L (ref 19–32)
CREATININE: 1.3 mg/dL (ref 0.4–1.5)
Chloride: 99 mEq/L (ref 96–112)
GFR: 59.26 mL/min — AB (ref >60.00)
GLUCOSE: 144 mg/dL — AB (ref 70–99)
POTASSIUM: 3.7 meq/L (ref 3.5–5.1)
Sodium: 137 mEq/L (ref 135–145)

## 2013-10-01 LAB — CBC WITH DIFFERENTIAL/PLATELET
Basophils Absolute: 0 10*3/uL (ref 0.0–0.1)
Basophils Relative: 0.4 % (ref 0.0–3.0)
EOS PCT: 1.8 % (ref 0.0–5.0)
Eosinophils Absolute: 0.2 10*3/uL (ref 0.0–0.7)
HEMATOCRIT: 49 % (ref 39.0–52.0)
HEMOGLOBIN: 16.4 g/dL (ref 13.0–17.0)
LYMPHS ABS: 2.7 10*3/uL (ref 0.7–4.0)
Lymphocytes Relative: 26.8 % (ref 12.0–46.0)
MCHC: 33.5 g/dL (ref 30.0–36.0)
MCV: 98.6 fl (ref 78.0–100.0)
MONO ABS: 1.1 10*3/uL — AB (ref 0.1–1.0)
Monocytes Relative: 10.7 % (ref 3.0–12.0)
NEUTROS ABS: 6.1 10*3/uL (ref 1.4–7.7)
Neutrophils Relative %: 60.3 % (ref 43.0–77.0)
PLATELETS: 214 10*3/uL (ref 150.0–400.0)
RBC: 4.97 Mil/uL (ref 4.22–5.81)
RDW: 14.6 % (ref 11.5–15.5)
WBC: 10.2 10*3/uL (ref 4.0–10.5)

## 2013-10-01 LAB — BRAIN NATRIURETIC PEPTIDE: Pro B Natriuretic peptide (BNP): 188 pg/mL — ABNORMAL HIGH (ref 0.0–100.0)

## 2013-10-01 LAB — TSH: TSH: 2.18 u[IU]/mL (ref 0.35–4.50)

## 2013-10-02 ENCOUNTER — Other Ambulatory Visit: Payer: Self-pay | Admitting: Cardiovascular Disease

## 2013-10-02 ENCOUNTER — Other Ambulatory Visit: Payer: Self-pay | Admitting: Cardiology

## 2013-10-03 NOTE — Telephone Encounter (Signed)
Kim, can you please clarify if the patient should be taking 1 or 1.5 tablet qd? I do not see where she was told to reduce the dose to 1 qd, but that is what her last ov has.  Thanks, MI

## 2013-10-07 ENCOUNTER — Telehealth: Payer: Self-pay | Admitting: Internal Medicine

## 2013-10-07 NOTE — Telephone Encounter (Signed)
Patient scheduled for office visit 6/9 @ 12:15pm with Dr. Caryl Comes.  Device @ [facility].

## 2013-10-07 NOTE — Telephone Encounter (Signed)
New message     Defibulator started beeping yesterday---low battery warning.  Please call

## 2013-10-08 ENCOUNTER — Ambulatory Visit (INDEPENDENT_AMBULATORY_CARE_PROVIDER_SITE_OTHER): Payer: Medicare (Managed Care) | Admitting: Internal Medicine

## 2013-10-08 ENCOUNTER — Encounter: Payer: Self-pay | Admitting: Internal Medicine

## 2013-10-08 ENCOUNTER — Other Ambulatory Visit: Payer: Self-pay | Admitting: *Deleted

## 2013-10-08 VITALS — BP 101/60 | HR 67 | Ht 62.0 in | Wt 180.2 lb

## 2013-10-08 DIAGNOSIS — I255 Ischemic cardiomyopathy: Secondary | ICD-10-CM

## 2013-10-08 DIAGNOSIS — I5022 Chronic systolic (congestive) heart failure: Secondary | ICD-10-CM

## 2013-10-08 DIAGNOSIS — Z01812 Encounter for preprocedural laboratory examination: Secondary | ICD-10-CM

## 2013-10-08 DIAGNOSIS — I472 Ventricular tachycardia: Secondary | ICD-10-CM

## 2013-10-08 DIAGNOSIS — I4891 Unspecified atrial fibrillation: Secondary | ICD-10-CM

## 2013-10-08 DIAGNOSIS — Z4502 Encounter for adjustment and management of automatic implantable cardiac defibrillator: Secondary | ICD-10-CM

## 2013-10-08 DIAGNOSIS — I2589 Other forms of chronic ischemic heart disease: Secondary | ICD-10-CM

## 2013-10-08 DIAGNOSIS — I4729 Other ventricular tachycardia: Secondary | ICD-10-CM

## 2013-10-08 LAB — BASIC METABOLIC PANEL
BUN: 17 mg/dL (ref 6–23)
CHLORIDE: 100 meq/L (ref 96–112)
CO2: 29 meq/L (ref 19–32)
Calcium: 9.1 mg/dL (ref 8.4–10.5)
Creatinine, Ser: 1.2 mg/dL (ref 0.4–1.5)
GFR: 63.69 mL/min (ref >60.00)
Glucose, Bld: 152 mg/dL — ABNORMAL HIGH (ref 70–99)
Potassium: 3.6 mEq/L (ref 3.5–5.1)
Sodium: 136 mEq/L (ref 135–145)

## 2013-10-08 LAB — MDC_IDC_ENUM_SESS_TYPE_INCLINIC
Brady Statistic AP VS Percent: 0 %
Brady Statistic AS VS Percent: 0 %
Brady Statistic RA Percent Paced: 99.95 %
Brady Statistic RV Percent Paced: 100 %
HIGH POWER IMPEDANCE MEASURED VALUE: 47 Ohm
HIGH POWER IMPEDANCE MEASURED VALUE: 60 Ohm
Lead Channel Impedance Value: 437 Ohm
Lead Channel Impedance Value: 494 Ohm
Lead Channel Impedance Value: 532 Ohm
Lead Channel Impedance Value: 855 Ohm
Lead Channel Pacing Threshold Amplitude: 0.75 V
Lead Channel Pacing Threshold Amplitude: 1.25 V
Lead Channel Pacing Threshold Pulse Width: 0.4 ms
Lead Channel Pacing Threshold Pulse Width: 0.4 ms
Lead Channel Pacing Threshold Pulse Width: 0.4 ms
Lead Channel Sensing Intrinsic Amplitude: 19.75 mV
Lead Channel Sensing Intrinsic Amplitude: 19.75 mV
Lead Channel Sensing Intrinsic Amplitude: 2.25 mV
Lead Channel Sensing Intrinsic Amplitude: 2.25 mV
Lead Channel Setting Pacing Amplitude: 2 V
Lead Channel Setting Sensing Sensitivity: 0.3 mV
MDC IDC MSMT BATTERY VOLTAGE: 2.62 V
MDC IDC MSMT LEADCHNL RA IMPEDANCE VALUE: 437 Ohm
MDC IDC MSMT LEADCHNL RV PACING THRESHOLD AMPLITUDE: 1.5 V
MDC IDC SESS DTM: 20150609125822
MDC IDC SET LEADCHNL LV PACING AMPLITUDE: 2.5 V
MDC IDC SET LEADCHNL LV PACING PULSEWIDTH: 0.4 ms
MDC IDC SET LEADCHNL RV PACING AMPLITUDE: 2.75 V
MDC IDC SET LEADCHNL RV PACING PULSEWIDTH: 0.4 ms
MDC IDC SET ZONE DETECTION INTERVAL: 450 ms
MDC IDC STAT BRADY AP VP PERCENT: 99.95 %
MDC IDC STAT BRADY AS VP PERCENT: 0.05 %
Zone Setting Detection Interval: 270 ms
Zone Setting Detection Interval: 370 ms
Zone Setting Detection Interval: 630 ms
Zone Setting Detection Interval: 640 ms

## 2013-10-08 LAB — CBC WITH DIFFERENTIAL/PLATELET
BASOS PCT: 0.3 % (ref 0.0–3.0)
Basophils Absolute: 0 10*3/uL (ref 0.0–0.1)
EOS PCT: 1.9 % (ref 0.0–5.0)
Eosinophils Absolute: 0.3 10*3/uL (ref 0.0–0.7)
HEMATOCRIT: 44.5 % (ref 39.0–52.0)
Hemoglobin: 15.1 g/dL (ref 13.0–17.0)
Lymphocytes Relative: 15.9 % (ref 12.0–46.0)
Lymphs Abs: 2.2 10*3/uL (ref 0.7–4.0)
MCHC: 33.9 g/dL (ref 30.0–36.0)
MCV: 98.7 fl (ref 78.0–100.0)
Monocytes Absolute: 1.4 10*3/uL — ABNORMAL HIGH (ref 0.1–1.0)
Monocytes Relative: 10.2 % (ref 3.0–12.0)
Neutro Abs: 9.8 10*3/uL — ABNORMAL HIGH (ref 1.4–7.7)
Neutrophils Relative %: 71.7 % (ref 43.0–77.0)
Platelets: 222 10*3/uL (ref 150.0–400.0)
RBC: 4.51 Mil/uL (ref 4.22–5.81)
RDW: 14.9 % (ref 11.5–15.5)
WBC: 13.6 10*3/uL — AB (ref 4.0–10.5)

## 2013-10-08 MED ORDER — FUROSEMIDE 40 MG PO TABS: 40.0000 mg | ORAL_TABLET | Freq: Two times a day (BID) | ORAL | Status: DC

## 2013-10-08 NOTE — Patient Instructions (Signed)
Your physician recommends that you continue on your current medications as directed. Please refer to the Current Medication list given to you today.  Pre procedure lab work today: BMET/CBCD  Your physician has recommended that you have a defibrillator generator change.  Please pick up your instruction sheet when you come for your lab work next week.  Your wound check is scheduled for 11/04/13 at 10:00 am

## 2013-10-08 NOTE — Progress Notes (Signed)
Patient Care Team: No Pcp Per Patient as PCP - General (General Practice)   HPI  Michael Krueger is a 58 y.o. male seen in followup for ventricular tachycardia status post ablation. He also has a history of atrial fibrillation and he takes amiodarone for the combination of the 2.   He is status post a CRT-D implantation for congestive heart failure in the setting of ischemic heart cardiomyopathy   His last echocardiogram was performed in July of 2010 and revealed an EF of 20-25 and mild MR  .  Last cath performed in July of 2010 and revealed an occluded RCA but nonobstructive disease in the Lcx and LAD other than ostial 80 in a small marginal; EF 10-15.   Amiodarone surveillance laboratories were normal June 2015; last digoxin level is measured one year ago and his level was reduced with repeat check of 0.9  5/14  Most recently his amiodarone dose was 200 mg every morning and 200 mg every afternoon MWF   The patient denies chest pain, shortness of breath, nocturnal dyspnea, orthopnea or peripheral edema. There have been no palpitations, lightheadedness or syncope.    Past Medical History  Diagnosis Date  . HYPERTHYROIDISM   . HYPERLIPIDEMIA-MIXED   . OBESITY-MORBID (>100')   . CAD   . AV BLOCK, COMPLETE   . VENTRICULAR TACHYCARDIA     S/P RFCA x3  2006, 2010,2010  . Atrial fibrillation   . Chronic systolic heart failure   . TRANSAMINASES, SERUM, ELEVATED   . Long term current use of anticoagulant   . Ischemic cardiomyopathy   . Implantable cardiac defibrillator  medtronic     Initially related 1990s, upgrade to dual-chamber 2002, generator change 2005, generator change August 2010, hematoma evacuation September 2010.  [name] Gynecomastia     Past Surgical History  Procedure Laterality Date  . Splenectomy    . Status post medtronic concerto crt-d in august 2010 with pocket revision      Current Outpatient Prescriptions  Medication Sig Dispense Refill  .  acetaminophen (TYLENOL) 500 MG tablet Take 500 mg by mouth as needed.      [name] amiodarone (PACERONE) 200 MG tablet TAKE 1 TABLET BY MOUTH EVERY MORNING AND 1/2 TABLET IN THE EVENING  45 tablet  6  . atorvastatin (LIPITOR) 80 MG tablet TAKE 1 TABLET BY MOUTH EVERY DAY  90 tablet  0  . carvedilol (COREG) 12.5 MG tablet TAKE 1 TABLET BY MOUTH TWICE DAILY  60 tablet  0  . DIGOX 125 MCG tablet TAKE 1/2 TABLET BY MOUTH EVERY DAY  15 tablet  2  . diphenhydrAMINE (BENADRYL) 25 mg capsule Take 25 mg by mouth every 6 (six) hours as needed.      [name] ELIQUIS 5 MG TABS tablet TAKE 1 TABLET BY MOUTH TWICE DAILY  60 tablet  3  . furosemide (LASIX) 20 MG tablet Take 20 mg by mouth 4 (four) times daily.      . pantoprazole (PROTONIX) 40 MG tablet Take 1 tablet by mouth Daily.      . ramipril (ALTACE) 2.5 MG capsule Take 1 capsule (2.5 mg total) by mouth 2 (two) times daily.  180 capsule  1  . traZODone (DESYREL) 50 MG tablet Take 25-100 mg by mouth at bedtime as needed.        No current facility-administered medications for this visit.    No Known Allergies  Review of Systems negative except from HPI and PMH  Physical Exam BP 101/60  Pulse 67  Ht 5\' 2"  (1.575 m)  Wt 180 lb 3.2 oz (81.738 kg)  BMI 32.95 kg/m2 Well developed and well nourished in no acute distress HENT normal E scleral and icterus clear Neck Supple JVP flat; carotids brisk and full Clear to ausculation  Regular rate and rhythm, no murmurs gallops or rub Soft with active bowel sounds No clubbing cyanosis  Edema Alert and oriented, grossly normal motor and sensory function Skin Warm and Dry  Assessment and Plan   Ventricular tachycardia   Ischemic cardio myopathy   Congestive heart failure acute on chronic systolic   Atrial fibrillation  CRT-D-Medtronic The patient's device was interrogated and the information was fully reviewed. The device was reprogrammed to narrow the QRS duration. This was accomplished by an RV offset of  +40   We have reviewed the benefits and risks of generator replacement.  These include but are not limited to lead fracture and infection.  The patient understands, agrees and is willing to proceed.   Given that this is his fourth procedure, we will use the antimicrobial coverage.  Continue amiodarone for VT and atrial fibrillation.  He is currently euvolemic. We'll continue his current medical regime.  We will check his digoxin level with his preoperative blood draw

## 2013-10-09 ENCOUNTER — Telehealth: Payer: Self-pay | Admitting: *Deleted

## 2013-10-09 ENCOUNTER — Telehealth (HOSPITAL_COMMUNITY): Payer: Self-pay | Admitting: *Deleted

## 2013-10-09 DIAGNOSIS — I5022 Chronic systolic (congestive) heart failure: Secondary | ICD-10-CM

## 2013-10-09 DIAGNOSIS — I255 Ischemic cardiomyopathy: Secondary | ICD-10-CM

## 2013-10-09 NOTE — Telephone Encounter (Signed)
Called pt to schedule echo prior to device implantation later this month. Also arranged for him to come by office for lab work after echo. Patient verbalized understanding and agreeable to plan.

## 2013-10-11 LAB — MDC_IDC_ENUM_SESS_TYPE_REMOTE
Battery Voltage: 2.62 V
Brady Statistic AP VP Percent: 99.97 %
Brady Statistic AS VP Percent: 0.03 %
Brady Statistic AS VS Percent: 0 %
Brady Statistic RA Percent Paced: 99.97 %
Date Time Interrogation Session: 20150528120157
HIGH POWER IMPEDANCE MEASURED VALUE: 46 Ohm
HIGH POWER IMPEDANCE MEASURED VALUE: 56 Ohm
Lead Channel Impedance Value: 437 Ohm
Lead Channel Impedance Value: 437 Ohm
Lead Channel Impedance Value: 551 Ohm
Lead Channel Sensing Intrinsic Amplitude: 19.75 mV
Lead Channel Setting Pacing Amplitude: 2.5 V
MDC IDC MSMT LEADCHNL LV IMPEDANCE VALUE: 532 Ohm
MDC IDC MSMT LEADCHNL LV IMPEDANCE VALUE: 836 Ohm
MDC IDC MSMT LEADCHNL RA SENSING INTR AMPL: 3.125 mV
MDC IDC SET LEADCHNL LV PACING PULSEWIDTH: 0.4 ms
MDC IDC SET LEADCHNL RA PACING AMPLITUDE: 2 V
MDC IDC SET LEADCHNL RV PACING AMPLITUDE: 2.75 V
MDC IDC SET LEADCHNL RV PACING PULSEWIDTH: 0.4 ms
MDC IDC SET LEADCHNL RV SENSING SENSITIVITY: 0.3 mV
MDC IDC SET ZONE DETECTION INTERVAL: 370 ms
MDC IDC SET ZONE DETECTION INTERVAL: 630 ms
MDC IDC STAT BRADY AP VS PERCENT: 0 %
MDC IDC STAT BRADY RV PERCENT PACED: 100 %
Zone Setting Detection Interval: 270 ms
Zone Setting Detection Interval: 450 ms
Zone Setting Detection Interval: 640 ms

## 2013-10-14 ENCOUNTER — Telehealth: Payer: Self-pay | Admitting: Internal Medicine

## 2013-10-14 ENCOUNTER — Other Ambulatory Visit: Payer: Self-pay | Admitting: Cardiology

## 2013-10-14 NOTE — Telephone Encounter (Signed)
New Message:  Pt states he has a few questions for Sherri about his upcoming procedure

## 2013-10-15 ENCOUNTER — Encounter: Payer: Self-pay | Admitting: *Deleted

## 2013-10-15 ENCOUNTER — Encounter: Payer: Self-pay | Admitting: Internal Medicine

## 2013-10-17 ENCOUNTER — Ambulatory Visit (HOSPITAL_COMMUNITY)
Admission: RE | Admit: 2013-10-17 | Discharge: 2013-10-17 | Disposition: A | Discharge status: Home / Self Care | Payer: Medicare (Managed Care) | Source: Ambulatory Visit | Attending: Cardiovascular Disease | Admitting: Cardiovascular Disease

## 2013-10-17 ENCOUNTER — Telehealth: Payer: Self-pay | Admitting: *Deleted

## 2013-10-17 ENCOUNTER — Other Ambulatory Visit: Payer: Medicare (Managed Care)

## 2013-10-17 DIAGNOSIS — I255 Ischemic cardiomyopathy: Secondary | ICD-10-CM

## 2013-10-17 DIAGNOSIS — I5022 Chronic systolic (congestive) heart failure: Secondary | ICD-10-CM

## 2013-10-17 DIAGNOSIS — I059 Rheumatic mitral valve disease, unspecified: Secondary | ICD-10-CM

## 2013-10-17 DIAGNOSIS — I2589 Other forms of chronic ischemic heart disease: Secondary | ICD-10-CM | POA: Insufficient documentation

## 2013-10-17 MED ORDER — PERFLUTREN LIPID MICROSPHERE
1.0000 mL | INTRAVENOUS | Status: AC | PRN
  Administered 2013-10-17: 3 mL via INTRAVENOUS

## 2013-10-17 NOTE — Progress Notes (Signed)
2D Echo Performed 10/17/2013    Marygrace Drought, RCS
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
## 2013-10-17 NOTE — Telephone Encounter (Addendum)
Patient just wanted to know if he had to stay overnight. Informed him that he did not need to stay overnight unless there are complications. He verbalized understanding.

## 2013-10-18 LAB — DIGOXIN LEVEL: Digoxin Level: 0.8 ng/mL (ref 0.8–2.0)

## 2013-10-18 NOTE — Progress Notes (Signed)
Pt was here for ECHO. Pt had ECHO with definity. Pt had IV started with #18 gauge in R arm. Pt vitals prior to ECHO were BP-101/60;HR-64;Pt did tolerate ECHO and definity without incident. Pt vitals s/p definity were BP-101/70 and HR-64. Pt was watched and evaluated s/p definity for 30 minutes and released.

## 2013-10-19 LAB — AMIODARONE LEVEL
Amiodarone Lvl: 1.3 ug/mL — ABNORMAL LOW (ref 1.5–2.5)
Desethylamiodarone: 1 ug/mL — ABNORMAL LOW (ref 1.5–2.5)

## 2013-10-22 ENCOUNTER — Encounter: Payer: Self-pay | Admitting: Cardiology

## 2013-10-22 MED ORDER — SODIUM CHLORIDE 0.9 % IR SOLN
80.0000 mg | Status: DC
  Filled 2013-10-22: qty 2

## 2013-10-22 MED ORDER — CEFAZOLIN SODIUM-DEXTROSE 2-3 GM-% IV SOLR
2.0000 g | INTRAVENOUS | Status: DC
Start: 2013-10-22 — End: 2013-10-23
  Filled 2013-10-22: qty 50

## 2013-10-22 NOTE — Telephone Encounter (Signed)
LMTCB

## 2013-10-22 NOTE — Telephone Encounter (Signed)
New message    Patient has questions regarding blood thinner medication.

## 2013-10-23 ENCOUNTER — Encounter (HOSPITAL_COMMUNITY)
Admission: RE | Disposition: A | Discharge status: Home / Self Care | Payer: Self-pay | Source: Ambulatory Visit | Attending: Internal Medicine

## 2013-10-23 ENCOUNTER — Ambulatory Visit (HOSPITAL_COMMUNITY)
Admission: RE | Admit: 2013-10-23 | Discharge: 2013-10-23 | Disposition: A | Discharge status: Home / Self Care | Payer: Medicare (Managed Care) | Source: Ambulatory Visit | Attending: Internal Medicine | Admitting: Internal Medicine

## 2013-10-23 DIAGNOSIS — G4733 Obstructive sleep apnea (adult) (pediatric): Secondary | ICD-10-CM | POA: Insufficient documentation

## 2013-10-23 DIAGNOSIS — I509 Heart failure, unspecified: Secondary | ICD-10-CM | POA: Insufficient documentation

## 2013-10-23 DIAGNOSIS — I442 Atrioventricular block, complete: Secondary | ICD-10-CM | POA: Insufficient documentation

## 2013-10-23 DIAGNOSIS — Z7901 Long term (current) use of anticoagulants: Secondary | ICD-10-CM | POA: Insufficient documentation

## 2013-10-23 DIAGNOSIS — I472 Ventricular tachycardia, unspecified: Secondary | ICD-10-CM | POA: Insufficient documentation

## 2013-10-23 DIAGNOSIS — I4729 Other ventricular tachycardia: Secondary | ICD-10-CM

## 2013-10-23 DIAGNOSIS — N62 Hypertrophy of breast: Secondary | ICD-10-CM | POA: Insufficient documentation

## 2013-10-23 DIAGNOSIS — E039 Hypothyroidism, unspecified: Secondary | ICD-10-CM | POA: Insufficient documentation

## 2013-10-23 DIAGNOSIS — I2589 Other forms of chronic ischemic heart disease: Secondary | ICD-10-CM

## 2013-10-23 DIAGNOSIS — E782 Mixed hyperlipidemia: Secondary | ICD-10-CM | POA: Insufficient documentation

## 2013-10-23 DIAGNOSIS — Z4502 Encounter for adjustment and management of automatic implantable cardiac defibrillator: Secondary | ICD-10-CM

## 2013-10-23 DIAGNOSIS — Z6832 Body mass index (BMI) 32.0-32.9, adult: Secondary | ICD-10-CM | POA: Insufficient documentation

## 2013-10-23 DIAGNOSIS — I5022 Chronic systolic (congestive) heart failure: Secondary | ICD-10-CM | POA: Insufficient documentation

## 2013-10-23 DIAGNOSIS — I4891 Unspecified atrial fibrillation: Secondary | ICD-10-CM

## 2013-10-23 DIAGNOSIS — I2510 Atherosclerotic heart disease of native coronary artery without angina pectoris: Secondary | ICD-10-CM | POA: Insufficient documentation

## 2013-10-23 DIAGNOSIS — I255 Ischemic cardiomyopathy: Secondary | ICD-10-CM

## 2013-10-23 HISTORY — PX: IMPLANTABLE CARDIOVERTER DEFIBRILLATOR GENERATOR CHANGE: SHX5474

## 2013-10-23 LAB — SURGICAL PCR SCREEN
MRSA, PCR: NEGATIVE
STAPHYLOCOCCUS AUREUS: POSITIVE — AB

## 2013-10-23 SURGERY — IMPLANTABLE CARDIOVERTER DEFIBRILLATOR GENERATOR CHANGE
Anesthesia: LOCAL

## 2013-10-23 MED ORDER — MIDAZOLAM HCL 5 MG/5ML IJ SOLN
INTRAMUSCULAR | Status: AC
  Filled 2013-10-23: qty 5

## 2013-10-23 MED ORDER — LIDOCAINE HCL (PF) 1 % IJ SOLN
INTRAMUSCULAR | Status: AC
  Filled 2013-10-23: qty 30

## 2013-10-23 MED ORDER — CHLORHEXIDINE GLUCONATE 4 % EX LIQD
60.0000 mL | Freq: Once | CUTANEOUS | Status: DC
  Filled 2013-10-23: qty 60

## 2013-10-23 MED ORDER — ACETAMINOPHEN 325 MG PO TABS: 325.0000 mg | ORAL_TABLET | ORAL | Status: DC | PRN

## 2013-10-23 MED ORDER — SODIUM CHLORIDE 0.9 % IV SOLN: INTRAVENOUS | Status: AC

## 2013-10-23 MED ORDER — SODIUM CHLORIDE 0.9 % IV SOLN
INTRAVENOUS | Status: DC
  Administered 2013-10-23: 07:00:00 via INTRAVENOUS

## 2013-10-23 MED ORDER — FENTANYL CITRATE 0.05 MG/ML IJ SOLN
INTRAMUSCULAR | Status: AC
  Filled 2013-10-23: qty 2

## 2013-10-23 MED ORDER — MUPIROCIN 2 % EX OINT
TOPICAL_OINTMENT | Freq: Two times a day (BID) | CUTANEOUS | Status: DC
  Administered 2013-10-23: 07:00:00 via NASAL
  Filled 2013-10-23 (×2): qty 22

## 2013-10-23 MED ORDER — ONDANSETRON HCL 4 MG/2ML IJ SOLN: 4.0000 mg | Freq: Four times a day (QID) | INTRAMUSCULAR | Status: DC | PRN

## 2013-10-23 NOTE — CV Procedure (Signed)
Preoperative diagnosis  Ischemic cardiomyopathy Postoperative diagnosis same/   Procedure: Generator replacement  Pocket revision  Following informed consent the patient was brought to the electrophysiology laboratory in place of the fluoroscopic table in the supine position after routine prep and drape lidocaine was infiltrated in the region of the previous incision and carried down to later the device pocket using sharp dissection and electrocautery. The pocket was opened the device was freed up and was explanted.  Interrogation of the previously implanted ICD ventricular lead Medtronic 5076 R/S lead  demonstrated an R wave of NONE  millivolts., and impedance of 456 ohms, and a pacing threshold of 1.25 volts at 0.4 msec.     Interrogation of the previously implanted Left ventricular lead [place] 2010   demonstrated an R wave of NONE  millivolts., and impedance of 817 ohms, and a pacing threshold of 1.0 volts at 0.4 msec.    The previously implanted atrial lead Medtronic 6940  demonstrated a P-wave amplitude of NONE milllivolts  and impedance of  0.5 ohms, and a pacing threshold of  0.5 volts at  @ 0.50milliseconds.  The leads were inspected. Repair was not needed. The leads were then attached to a Medtronic VIVA  pulse generator, serial number [id number].    ALL THE ABOVE MEASUREMENTS WERE MADE hrough the device    High voltage impedances were 47/59 ohms  The pocket was extended caudally and cephalad to allow for reinsertion of the device  An Aegis antimicrobial mesh sleeve was also inserted as this was the patients >= 4 procedure  The pocket was irrigated with antibiotic containing saline solution hemostasis was assured and the leads and the device were placed in the pocket. The wound was then closed in 2  layers in normal fashion. Dermabond dressing was applied  The patient tolerated the procedure without apparent complication.  DFT testing was not  performed  [name]   \

## 2013-10-23 NOTE — H&P (View-Only) (Signed)
Patient Care Team: No Pcp Per Patient as PCP - General (General Practice)   HPI  Michael Krueger is a 58 y.o. male seen in followup for ventricular tachycardia status post ablation. He also has a history of atrial fibrillation and he takes amiodarone for the combination of the 2.   He is status post a CRT-D implantation for congestive heart failure in the setting of ischemic heart cardiomyopathy   His last echocardiogram was performed in July of 2010 and revealed an EF of 20-25 and mild MR  .  Last cath performed in July of 2010 and revealed an occluded RCA but nonobstructive disease in the Lcx and LAD other than ostial 80 in a small marginal; EF 10-15.   Amiodarone surveillance laboratories were normal June 2015; last digoxin level is measured one year ago and his level was reduced with repeat check of 0.9  5/14  Most recently his amiodarone dose was 200 mg every morning and 200 mg every afternoon MWF   The patient denies chest pain, shortness of breath, nocturnal dyspnea, orthopnea or peripheral edema. There have been no palpitations, lightheadedness or syncope.    Past Medical History  Diagnosis Date  . HYPERTHYROIDISM   . HYPERLIPIDEMIA-MIXED   . OBESITY-MORBID (>100')   . CAD   . AV BLOCK, COMPLETE   . VENTRICULAR TACHYCARDIA     S/P RFCA x3  2006, 2010,2010  . Atrial fibrillation   . Chronic systolic heart failure   . TRANSAMINASES, SERUM, ELEVATED   . Long term current use of anticoagulant   . Ischemic cardiomyopathy   . Implantable cardiac defibrillator  medtronic     Initially related 1990s, upgrade to dual-chamber 2002, generator change 2005, generator change August 2010, hematoma evacuation September 2010.  [name] Gynecomastia     Past Surgical History  Procedure Laterality Date  . Splenectomy    . Status post medtronic concerto crt-d in august 2010 with pocket revision      Current Outpatient Prescriptions  Medication Sig Dispense Refill  .  acetaminophen (TYLENOL) 500 MG tablet Take 500 mg by mouth as needed.      [name] amiodarone (PACERONE) 200 MG tablet TAKE 1 TABLET BY MOUTH EVERY MORNING AND 1/2 TABLET IN THE EVENING  45 tablet  6  . atorvastatin (LIPITOR) 80 MG tablet TAKE 1 TABLET BY MOUTH EVERY DAY  90 tablet  0  . carvedilol (COREG) 12.5 MG tablet TAKE 1 TABLET BY MOUTH TWICE DAILY  60 tablet  0  . DIGOX 125 MCG tablet TAKE 1/2 TABLET BY MOUTH EVERY DAY  15 tablet  2  . diphenhydrAMINE (BENADRYL) 25 mg capsule Take 25 mg by mouth every 6 (six) hours as needed.      [name] ELIQUIS 5 MG TABS tablet TAKE 1 TABLET BY MOUTH TWICE DAILY  60 tablet  3  . furosemide (LASIX) 20 MG tablet Take 20 mg by mouth 4 (four) times daily.      . pantoprazole (PROTONIX) 40 MG tablet Take 1 tablet by mouth Daily.      . ramipril (ALTACE) 2.5 MG capsule Take 1 capsule (2.5 mg total) by mouth 2 (two) times daily.  180 capsule  1  . traZODone (DESYREL) 50 MG tablet Take 25-100 mg by mouth at bedtime as needed.        No current facility-administered medications for this visit.    No Known Allergies  Review of Systems negative except from HPI and PMH  Physical Exam BP 101/60  Pulse 67  Ht 5\' 2"  (1.575 m)  Wt 180 lb 3.2 oz (81.738 kg)  BMI 32.95 kg/m2 Well developed and well nourished in no acute distress HENT normal E scleral and icterus clear Neck Supple JVP flat; carotids brisk and full Clear to ausculation  Regular rate and rhythm, no murmurs gallops or rub Soft with active bowel sounds No clubbing cyanosis  Edema Alert and oriented, grossly normal motor and sensory function Skin Warm and Dry  Assessment and Plan   Ventricular tachycardia   Ischemic cardio myopathy   Congestive heart failure acute on chronic systolic   Atrial fibrillation  CRT-D-Medtronic The patient's device was interrogated and the information was fully reviewed. The device was reprogrammed to narrow the QRS duration. This was accomplished by an RV offset of  +40   We have reviewed the benefits and risks of generator replacement.  These include but are not limited to lead fracture and infection.  The patient understands, agrees and is willing to proceed.   Given that this is his fourth procedure, we will use the antimicrobial coverage.  Continue amiodarone for VT and atrial fibrillation.  He is currently euvolemic. We'll continue his current medical regime.  We will check his digoxin level with his preoperative blood draw

## 2013-10-23 NOTE — Discharge Instructions (Signed)
Pacemaker Battery Change °A pacemaker battery usually lasts 4 to 12 years. Once or twice per year, you will be asked to visit your health care provider to have a full evaluation of your pacemaker. When a battery needs to be replaced, the entire pacemaker is replaced so that you can benefit from new circuitry and any new features that have been added to pacemakers. Most often, this procedure is very simple because the leads are already in place.  °There are many things that affect how long a pacemaker battery will last, including:  °· The age of the pacemaker.   °· The number of leads (1, 2, or 3).   °· The pacemaker work load. If the pacemaker is helping the heart more often, the battery will not last as long as it would if the pacemaker did not need to help the heart.   °· Power (voltage) settings.  °LET YOUR HEALTH CARE PROVIDER KNOW ABOUT:  °· Any allergies you have.   °· All medicines you are taking, including vitamins, herbs, eye drops, creams, and over-the-counter medicines.   °· Previous problems you or members of your family have had with the use of anesthetics.   °· Any blood disorders you have.   °· Previous surgeries you have had, especially since your last pacemaker placement.   °· Medical conditions you have.   °· Possible pregnancy, if applicable. °· Symptoms of chest pain, trouble breathing, palpitations, lightheadedness, or feelings of an abnormal or irregular heartbeat. °RISKS AND COMPLICATIONS  °Generally, this is a safe procedure. However, as with any procedure, complications can occur. Possible complications include:  °· Bleeding.   °· Bruising of the skin around where the incision was made.   °· Pain at the incision site.   °· Pulling apart of the skin at the incision site.   °· Infection.   °· Allergic reaction to anesthetics or other medicines used during the procedure.   °Diabetics may have a temporary increase in their blood sugar after any surgical procedure.  °BEFORE THE PROCEDURE  °· Wash  all of the skin around the area of the chest where the pacemaker is located.   °· Ask your health care provider for help with any medicine adjustments before the pacemaker is replaced.   °· Unless advised otherwise, do not eat or drink after midnight on the night before the procedure. You may drink water to take your medicine as you normally would or as directed. °PROCEDURE  °· After giving medicine to numb the skin, your health care provider will make a cut to reopen the pocket holding the pacemaker.   °· The old pacemaker will be disconnected from its leads.   °· The leads will be tested.   °· If needed, the leads will be replaced. If the leads are functioning properly, the new pacemaker may be connected to the existing leads. °· A heart monitor and the pacemaker programmer will be used to make sure that the new pacemaker is working properly. °· The incision site is then closed. A dressing is placed over the pacemaker site. The dressing is removed 24 to 48 hours afterwards. °AFTER THE PROCEDURE  °· You will be taken to a recovery area after the new pacemaker implant is completed. Your vital signs such as blood pressure, heart rate, breathing, and oxygen levels will be monitored. °· Your health care provider will tell you when you will need to next test your pacemaker or when to return to the office for follow-up for removal of stitches. °Document Released: 07/27/2006 Document Revised: 04/23/2013 Document Reviewed: 10/31/2012 °[employer]® Patient Information ©2015 [employer]. This   information is not intended to replace advice given to you by your health care provider. Make sure you discuss any questions you have with your health care provider. ° °

## 2013-10-23 NOTE — Interval H&P Note (Signed)
ICD Criteria  Current LVEF:15% ;Obtained < 1 month ago.  NYHA Functional Classification: Class II  Heart Failure History:  Yes, Duration of heart failure since onset is > 9 months  Non-Ischemic Dilated Cardiomyopathy History:  No.  Atrial Fibrillation/Atrial Flutter:  yes  Ventricular Tachycardia History:  Yes, Hemodynamic status unknown. VT Type:  SVT - Monomorphic.  Cardiac Arrest History:  No  History of Syndromes with Risk of Sudden Death:  No.  Previous ICD:  Yes, ICD Type:  CRT-D, Reason for ICD:  Primary prevention.  LVEF is not available  Electrophysiology Study: No.  Prior MI: Yes, Most recent MI timeframe is > 40 days.  PPM: No.  OSA:  Yes  Patient Life Expectancy of >=1 year: Yes.  Anticoagulation Therapy:  Patient is on anticoagulation therapy, anticoagulation was held prior to procedure.   Beta Blocker Therapy:  Yes.   Ace Inhibitor/ARB Therapy:  Yes.History and Physical Interval Note:  10/23/2013 7:47 AM  Michael Krueger  has presented today for surgery, with the diagnosis of battery depletion  The various methods of treatment have been discussed with the patient and family. After consideration of risks, benefits and other options for treatment, the patient has consented to  Procedure(s): IMPLANTABLE CARDIOVERTER DEFIBRILLATOR GENERATOR CHANGE (N/A) as a surgical intervention .  The patient's history has been reviewed, patient examined, no change in status, stable for surgery.  I have reviewed the patient's chart and labs.  Questions were answered to the patient's satisfaction.     Michael Krueger

## 2013-10-24 ENCOUNTER — Encounter: Payer: Self-pay | Admitting: Internal Medicine

## 2013-10-25 LAB — WOUND CULTURE: Culture: NO GROWTH

## 2013-11-04 ENCOUNTER — Ambulatory Visit (INDEPENDENT_AMBULATORY_CARE_PROVIDER_SITE_OTHER): Payer: Medicare (Managed Care) | Admitting: *Deleted

## 2013-11-04 DIAGNOSIS — I442 Atrioventricular block, complete: Secondary | ICD-10-CM

## 2013-11-04 DIAGNOSIS — I4729 Other ventricular tachycardia: Secondary | ICD-10-CM

## 2013-11-04 DIAGNOSIS — I2589 Other forms of chronic ischemic heart disease: Secondary | ICD-10-CM

## 2013-11-04 DIAGNOSIS — Z95810 Presence of automatic (implantable) cardiac defibrillator: Secondary | ICD-10-CM

## 2013-11-04 DIAGNOSIS — I5022 Chronic systolic (congestive) heart failure: Secondary | ICD-10-CM

## 2013-11-04 DIAGNOSIS — I472 Ventricular tachycardia: Secondary | ICD-10-CM

## 2013-11-04 DIAGNOSIS — I255 Ischemic cardiomyopathy: Secondary | ICD-10-CM

## 2013-11-04 LAB — MDC_IDC_ENUM_SESS_TYPE_INCLINIC
Battery Remaining Longevity: 85 mo
Battery Voltage: 3.12 V
Brady Statistic AP VS Percent: 0 %
Brady Statistic RA Percent Paced: 99.91 %
Date Time Interrogation Session: 20150706102017
HIGH POWER IMPEDANCE MEASURED VALUE: 60 Ohm
HighPow Impedance: 247 Ohm
HighPow Impedance: 44 Ohm
Lead Channel Impedance Value: 437 Ohm
Lead Channel Impedance Value: 456 Ohm
Lead Channel Impedance Value: 513 Ohm
Lead Channel Impedance Value: 836 Ohm
Lead Channel Pacing Threshold Amplitude: 1 V
Lead Channel Pacing Threshold Pulse Width: 0.4 ms
Lead Channel Pacing Threshold Pulse Width: 0.4 ms
Lead Channel Pacing Threshold Pulse Width: 0.4 ms
Lead Channel Setting Pacing Amplitude: 2 V
Lead Channel Setting Pacing Amplitude: 2.25 V
Lead Channel Setting Pacing Amplitude: 3 V
Lead Channel Setting Pacing Pulse Width: 0.4 ms
MDC IDC MSMT LEADCHNL LV IMPEDANCE VALUE: 513 Ohm
MDC IDC MSMT LEADCHNL RA PACING THRESHOLD AMPLITUDE: 0.75 V
MDC IDC MSMT LEADCHNL RV PACING THRESHOLD AMPLITUDE: 1.375 V
MDC IDC SET LEADCHNL LV PACING PULSEWIDTH: 0.4 ms
MDC IDC SET LEADCHNL RV SENSING SENSITIVITY: 0.3 mV
MDC IDC SET ZONE DETECTION INTERVAL: 370 ms
MDC IDC SET ZONE DETECTION INTERVAL: 450 ms
MDC IDC SET ZONE DETECTION INTERVAL: 450 ms
MDC IDC STAT BRADY AP VP PERCENT: 99.91 %
MDC IDC STAT BRADY AS VP PERCENT: 0.09 %
MDC IDC STAT BRADY AS VS PERCENT: 0 %
MDC IDC STAT BRADY RV PERCENT PACED: 99.97 %
Zone Setting Detection Interval: 270 ms
Zone Setting Detection Interval: 630 ms

## 2013-11-04 NOTE — Progress Notes (Signed)
Wound check appointment. No steri-strips, dermabond present. Wound without redness or edema. Incision edges approximated, wound well healed. Normal device function. Thresholds, sensing, and impedances consistent with implant measurements. Device programmed at chronic lead settings--changed LV output from 2.5 to 2.25V. Histogram distribution appropriate for patient and level of activity. No mode switches or ventricular arrhythmias noted. Patient educated about wound care, arm mobility, lifting restrictions, shock plan. [facility] 02/03/14 & ROV w/ Dr. Caryl Comes in 99mo per pt request (vs 60mo).

## 2013-11-19 ENCOUNTER — Encounter: Payer: Self-pay | Admitting: Internal Medicine

## 2013-11-29 ENCOUNTER — Ambulatory Visit (INDEPENDENT_AMBULATORY_CARE_PROVIDER_SITE_OTHER): Payer: Medicare (Managed Care) | Admitting: Internal Medicine

## 2013-11-29 ENCOUNTER — Other Ambulatory Visit: Payer: Self-pay | Admitting: Internal Medicine

## 2013-11-29 ENCOUNTER — Encounter: Payer: Self-pay | Admitting: Internal Medicine

## 2013-11-29 ENCOUNTER — Telehealth: Payer: Self-pay | Admitting: Anesthesiology

## 2013-11-29 VITALS — BP 114/71 | HR 83 | Temp 97.7°F | Ht 62.0 in | Wt 177.0 lb

## 2013-11-29 DIAGNOSIS — T827XXA Infection and inflammatory reaction due to other cardiac and vascular devices, implants and grafts, initial encounter: Secondary | ICD-10-CM

## 2013-11-29 MED ORDER — CEPHALEXIN 500 MG PO CAPS: 500.0000 mg | ORAL_CAPSULE | Freq: Three times a day (TID) | ORAL | Status: DC

## 2013-11-29 NOTE — Progress Notes (Signed)
Patient Care Team: No Pcp Per Patient as PCP - General (General Practice)   HPI  Michael Krueger is a 58 y.o. male Seen as an add-on today.  He underwent later generator replacement 10/23/13 with a support of Aegis pouch.  He was seen postoperatively appropriately with no observations made suggesting wound infection.  This morning he felt as if she on his incision and as he scratched it there was the awareness of moisture with a subsequent expression of a small amount of pus.  He has had no fevers or chills. There is no associated tenderness of the pocket except at the very medial aspect.   He has a history of ventricular tachycardia status post ablation. He also has a history of atrial fibrillation and he takes amiodarone for the combination of the 2.  He is status post a CRT-D implantation for congestive heart failure in the setting of ischemic heart cardiomyopathy  His last echocardiogram was performed in July of 2010 and revealed an EF of 20-25 and mild MR  .  Last cath performed in July of 2010 and revealed an occluded RCA but nonobstructive disease in the Lcx and LAD other than ostial 80 in a small marginal; EF 10-15.  Amiodarone surveillance laboratories were normal June 2015; last digoxin level is measured one year ago and his level was reduced with repeat check of 0.9 5/14  Most recently his amiodarone dose was 200 mg every morning and 200 mg every afternoon MWF  The patient denies chest pain, shortness of breath, nocturnal dyspnea, orthopnea or peripheral edema. There have been no palpitations, lightheadedness or syncope.     Past Medical History  Diagnosis Date  . HYPERTHYROIDISM   . HYPERLIPIDEMIA-MIXED   . OBESITY-MORBID (>100')   . CAD   . AV BLOCK, COMPLETE   . VENTRICULAR TACHYCARDIA     S/P RFCA x3  2006, 2010,2010  . Atrial fibrillation   . Chronic systolic heart failure   . TRANSAMINASES, SERUM, ELEVATED   . Long term current use of  anticoagulant   . Ischemic cardiomyopathy   . Implantable cardiac defibrillator  medtronic     Initially related 1990s, upgrade to dual-chamber 2002, generator change 2005, generator change August 2010, hematoma evacuation September 2010.  Michael Krueger Gynecomastia     Past Surgical History  Procedure Laterality Date  . Splenectomy    . Status post medtronic concerto crt-d in august 2010 with pocket revision      Current Outpatient Prescriptions  Medication Sig Dispense Refill  . acetaminophen (TYLENOL) 500 MG tablet Take 1,000 mg by mouth daily as needed for headache.       Michael Krueger amiodarone (PACERONE) 200 MG tablet Take 200 mg by mouth daily.      Michael Krueger apixaban (ELIQUIS) 5 MG TABS tablet Take 5 mg by mouth 2 (two) times daily.      Michael Krueger atorvastatin (LIPITOR) 80 MG tablet Take 80 mg by mouth daily.      . carvedilol (COREG) 12.5 MG tablet Take 12.5 mg by mouth 2 (two) times daily with a meal.      . digoxin (LANOXIN) 0.125 MG tablet Take 0.0625 mg by mouth daily.      . furosemide (LASIX) 40 MG tablet Take 1 tablet (40 mg total) by mouth 2 (two) times daily.  60 tablet  6  . pantoprazole (PROTONIX) 40 MG tablet Take 1 tablet by mouth Daily.      . ramipril (ALTACE) 2.5 MG  capsule Take 1 capsule (2.5 mg total) by mouth 2 (two) times daily.  180 capsule  1  . traZODone (DESYREL) 50 MG tablet Take 25-100 mg by mouth daily as needed for sleep.        No current facility-administered medications for this visit.    Allergies  Allergen Reactions  . Other Itching and Other (See Comments)    CHG wipes/  Caused a rash    Review of Systems negative except from HPI and PMH  Physical Exam BP 114/71  Pulse 83  Temp(Src) 97.7 F (36.5 C)  Ht 5\' 2"  (1.575 m)  Wt 177 lb (80.287 kg)  BMI 32.37 kg/m2 Well developed and well nourished in no acute distress HENT normal E scleral and icterus clear Neck Supple JVP flat; carotids brisk and full Clear to ausculation  Swelling over his left breast, notably  chronic  Small wound dehiscence medial aspect piece of retained suture and expressible pus  Regular rate and rhythm, no murmurs gallops or rub Soft with active bowel sounds No clubbing cyanosis  Edema Alert and oriented, grossly normal motor and sensory function Skin Warm and Dry    Assessment and  Plan  Incisional infection  Ventricular tachycardia  Implantable defibrillator  Ischemic cardiomyopathy  The patient does not appear ill. This is not necessarily exclude bacteremia so we will get blood cultures. I suspect that this is an incisional infection and and related to the piece of retained suture that was removed. (I sure hope so anyway) We will begin him on cephalexin 500 Q8 x10 days. I will see him again in 15 days time. He is advised to return the hospital urgently for fevers and chills and/or worsening of the infection

## 2013-11-29 NOTE — Telephone Encounter (Signed)
New message    Pt thinks his defibulator site is getting infected.  Please call

## 2013-11-29 NOTE — Patient Instructions (Signed)
Your physician has recommended you make the following change in your medication:  1) START Keflex 500 mg every 8 hours for 10 days.

## 2013-11-29 NOTE — Telephone Encounter (Signed)
Patient tells me site is real red with puss coming out of site.  Scheduled him to see Dr. Caryl Comes today at 4 pm

## 2013-11-30 ENCOUNTER — Other Ambulatory Visit: Payer: Self-pay | Admitting: Cardiology

## 2013-12-06 LAB — CULTURE, BLOOD (SINGLE)
Organism ID, Bacteria: NO GROWTH
Organism ID, Bacteria: NO GROWTH

## 2013-12-17 ENCOUNTER — Other Ambulatory Visit: Payer: Self-pay | Admitting: Cardiology

## 2013-12-25 ENCOUNTER — Encounter: Payer: Self-pay | Admitting: Cardiology

## 2013-12-25 ENCOUNTER — Ambulatory Visit (INDEPENDENT_AMBULATORY_CARE_PROVIDER_SITE_OTHER): Payer: Medicare (Managed Care) | Admitting: Cardiology

## 2013-12-25 VITALS — BP 108/70 | HR 70 | Ht 62.0 in | Wt 182.0 lb

## 2013-12-25 DIAGNOSIS — I472 Ventricular tachycardia, unspecified: Secondary | ICD-10-CM

## 2013-12-25 DIAGNOSIS — E785 Hyperlipidemia, unspecified: Secondary | ICD-10-CM

## 2013-12-25 DIAGNOSIS — I4891 Unspecified atrial fibrillation: Secondary | ICD-10-CM

## 2013-12-25 DIAGNOSIS — Z95810 Presence of automatic (implantable) cardiac defibrillator: Secondary | ICD-10-CM

## 2013-12-25 DIAGNOSIS — I4729 Other ventricular tachycardia: Secondary | ICD-10-CM

## 2013-12-25 DIAGNOSIS — I2589 Other forms of chronic ischemic heart disease: Secondary | ICD-10-CM

## 2013-12-25 DIAGNOSIS — I2510 Atherosclerotic heart disease of native coronary artery without angina pectoris: Secondary | ICD-10-CM

## 2013-12-25 DIAGNOSIS — I255 Ischemic cardiomyopathy: Secondary | ICD-10-CM

## 2013-12-25 DIAGNOSIS — I480 Paroxysmal atrial fibrillation: Secondary | ICD-10-CM

## 2013-12-25 DIAGNOSIS — I5022 Chronic systolic (congestive) heart failure: Secondary | ICD-10-CM

## 2013-12-25 LAB — BASIC METABOLIC PANEL WITH GFR
BUN: 15 mg/dL (ref 6–23)
CALCIUM: 9.3 mg/dL (ref 8.4–10.5)
CO2: 28 meq/L (ref 19–32)
Chloride: 105 mEq/L (ref 96–112)
Creat: 1.21 mg/dL (ref 0.50–1.35)
GFR, EST AFRICAN AMERICAN: 76 mL/min
GFR, Est Non African American: 66 mL/min
GLUCOSE: 135 mg/dL — AB (ref 70–99)
Potassium: 4.4 mEq/L (ref 3.5–5.3)
SODIUM: 139 meq/L (ref 135–145)

## 2013-12-25 LAB — CBC
HCT: 43.9 % (ref 39.0–52.0)
Hemoglobin: 15.7 g/dL (ref 13.0–17.0)
MCH: 33.5 pg (ref 26.0–34.0)
MCHC: 35.8 g/dL (ref 30.0–36.0)
MCV: 93.6 fL (ref 78.0–100.0)
Platelets: 232 10*3/uL (ref 150–400)
RBC: 4.69 MIL/uL (ref 4.22–5.81)
RDW: 15.5 % (ref 11.5–15.5)
WBC: 10.1 10*3/uL (ref 4.0–10.5)

## 2013-12-25 NOTE — Assessment & Plan Note (Signed)
Continue amiodarone and apixaban. Check renal function and hemoglobin.

## 2013-12-25 NOTE — Assessment & Plan Note (Addendum)
Continue statin. Not on aspirin given need for anticoagulation. 

## 2013-12-25 NOTE — Assessment & Plan Note (Signed)
Continue ACE inhibitor and beta blocker. 

## 2013-12-25 NOTE — Assessment & Plan Note (Signed)
Continue present dose of Lasix. Check potassium and renal function. 

## 2013-12-25 NOTE — Assessment & Plan Note (Addendum)
Continue amiodarone. We will check TSH, liver functions and chest x-ray at next office visit.

## 2013-12-25 NOTE — Progress Notes (Signed)
HPI: FU coronary artery disease, ischemic cardiomyopathy, history of ICD, history of ventricular tachycardia status post ablation, history of atrial fibrillation on amiodarone therapy. Last cath performed in July of 2010 and revealed an occluded RCA but nonobstructive disease in the Lcx and LAD other than ostial 80 in a small marginal; EF 10-15. He has had CRT and VT ablations. Myoview in April of 2013 showed an ejection fraction of 14%. There was scar but no ischemia. Echo 6/15 showed EF 15, mild LAE and mild MR. Had ICD generator change 6/15. Seen 11/29/13 with and noted to have incisional infection; placed on Keflex; blood cultures neg. Since last seen, He has mild dyspnea on exertion but no orthopnea or PND. Occasional minimal pedal edema. No chest pain or syncope. No fevers or chills. No swelling at the generator site. No discharge.  Current Outpatient Prescriptions  Medication Sig Dispense Refill  . acetaminophen (TYLENOL) 500 MG tablet Take 1,000 mg by mouth daily as needed for headache.       [name] amiodarone (PACERONE) 200 MG tablet Take 200 mg by mouth daily.      [name] apixaban (ELIQUIS) 5 MG TABS tablet Take 5 mg by mouth 2 (two) times daily.      [name] atorvastatin (LIPITOR) 80 MG tablet Take 80 mg by mouth daily.      . carvedilol (COREG) 12.5 MG tablet TAKE 1 TABLET BY MOUTH TWICE DAILY  60 tablet  6  . digoxin (LANOXIN) 0.125 MG tablet Take 0.0625 mg by mouth daily.      . furosemide (LASIX) 40 MG tablet Take 1 tablet (40 mg total) by mouth 2 (two) times daily.  60 tablet  6  . ketoconazole (NIZORAL) 2 % shampoo       . pantoprazole (PROTONIX) 40 MG tablet Take 1 tablet by mouth Daily.      . ramipril (ALTACE) 2.5 MG capsule Take 1 capsule (2.5 mg total) by mouth 2 (two) times daily.  180 capsule  1  . traZODone (DESYREL) 50 MG tablet Take 25-100 mg by mouth daily as needed for sleep.       [name] triamcinolone cream (KENALOG) 0.1 %        No current facility-administered medications for  this visit.     Past Medical History  Diagnosis Date  . HYPERTHYROIDISM   . HYPERLIPIDEMIA-MIXED   . OBESITY-MORBID (>100')   . CAD   . AV BLOCK, COMPLETE   . VENTRICULAR TACHYCARDIA     S/P RFCA x3  2006, 2010,2010  . Atrial fibrillation   . Chronic systolic heart failure   . TRANSAMINASES, SERUM, ELEVATED   . Long term current use of anticoagulant   . Ischemic cardiomyopathy   . Implantable cardiac defibrillator  medtronic     Initially related 1990s, upgrade to dual-chamber 2002, generator change 2005, generator change August 2010, hematoma evacuation September 2010.  [name] Gynecomastia     Past Surgical History  Procedure Laterality Date  . Splenectomy    . Status post medtronic concerto crt-d in august 2010 with pocket revision      History   Social History  . Marital Status: Single    Spouse Name: N/A    Number of Children: N/A  . Years of Education: N/A   Occupational History  . Not on file.   Social History Main Topics  . Smoking status: Former Research scientist (life sciences)  . Smokeless tobacco: Never Used  . Alcohol Use: Yes  . Drug Use:  No  . Sexual Activity: Not on file   Other Topics Concern  . Not on file   Social History Narrative  . No narrative on file    ROS: no fevers or chills, productive cough, hemoptysis, dysphasia, odynophagia, melena, hematochezia, dysuria, hematuria, rash, seizure activity, orthopnea, PND, claudication. Remaining systems are negative.  Physical Exam: Well-developed well-nourished in no acute distress.  Skin is warm and dry.  HEENT is normal.  Neck is supple.  Chest is clear to auscultation with normal expansion. ICD left chest. Incision without evidence of infection. Cardiovascular exam is regular rate and rhythm.  Abdominal exam nontender or distended. No masses palpated. Extremities show no edema. neuro grossly intact  ECG 10/08/2013-AV paced.

## 2013-12-25 NOTE — Patient Instructions (Signed)
Your physician wants you to follow-up in: 6 MONTHS WITH DR CRENSHAW You will receive a reminder letter in the mail two months in advance. If you don't receive a letter, please call our office to schedule the follow-up appointment.   Your physician recommends that you HAVE LAB WORK TODAY 

## 2013-12-25 NOTE — Assessment & Plan Note (Signed)
Management per electrophysiology. Note there is no evidence of infection at the generator site today.

## 2013-12-25 NOTE — Assessment & Plan Note (Signed)
Continue statin. 

## 2014-01-03 ENCOUNTER — Other Ambulatory Visit: Payer: Self-pay | Admitting: Cardiology

## 2014-01-17 ENCOUNTER — Other Ambulatory Visit: Payer: Self-pay | Admitting: *Deleted

## 2014-02-01 ENCOUNTER — Other Ambulatory Visit: Payer: Self-pay | Admitting: Cardiology

## 2014-02-03 ENCOUNTER — Telehealth: Payer: Self-pay | Admitting: Cardiology

## 2014-02-03 ENCOUNTER — Ambulatory Visit (INDEPENDENT_AMBULATORY_CARE_PROVIDER_SITE_OTHER): Payer: Medicare (Managed Care) | Admitting: *Deleted

## 2014-02-03 DIAGNOSIS — I4729 Other ventricular tachycardia: Secondary | ICD-10-CM

## 2014-02-03 DIAGNOSIS — I5022 Chronic systolic (congestive) heart failure: Secondary | ICD-10-CM

## 2014-02-03 DIAGNOSIS — I255 Ischemic cardiomyopathy: Secondary | ICD-10-CM

## 2014-02-03 DIAGNOSIS — I472 Ventricular tachycardia: Secondary | ICD-10-CM

## 2014-02-03 NOTE — Telephone Encounter (Signed)
LMOVM reminding pt to send remote transmission.   

## 2014-02-04 ENCOUNTER — Telehealth: Payer: Self-pay | Admitting: Internal Medicine

## 2014-02-04 ENCOUNTER — Encounter: Payer: Self-pay | Admitting: Internal Medicine

## 2014-02-04 LAB — MDC_IDC_ENUM_SESS_TYPE_REMOTE
Battery Voltage: 3.05 V
Brady Statistic AP VS Percent: 0.01 %
Brady Statistic AS VP Percent: 0.92 %
Brady Statistic AS VS Percent: 0 %
Date Time Interrogation Session: 20151006205218
HIGH POWER IMPEDANCE MEASURED VALUE: 266 Ohm
HIGH POWER IMPEDANCE MEASURED VALUE: 46 Ohm
HighPow Impedance: 59 Ohm
Lead Channel Impedance Value: 437 Ohm
Lead Channel Impedance Value: 437 Ohm
Lead Channel Impedance Value: 494 Ohm
Lead Channel Impedance Value: 817 Ohm
Lead Channel Sensing Intrinsic Amplitude: 2.875 mV
Lead Channel Sensing Intrinsic Amplitude: 2.875 mV
Lead Channel Setting Pacing Amplitude: 2.25 V
Lead Channel Setting Pacing Pulse Width: 0.4 ms
Lead Channel Setting Sensing Sensitivity: 0.3 mV
MDC IDC MSMT BATTERY REMAINING LONGEVITY: 81 mo
MDC IDC MSMT LEADCHNL RV IMPEDANCE VALUE: 513 Ohm
MDC IDC MSMT LEADCHNL RV PACING THRESHOLD AMPLITUDE: 1.5 V
MDC IDC MSMT LEADCHNL RV PACING THRESHOLD PULSEWIDTH: 0.4 ms
MDC IDC SET LEADCHNL RA PACING AMPLITUDE: 2 V
MDC IDC SET LEADCHNL RV PACING AMPLITUDE: 3 V
MDC IDC SET LEADCHNL RV PACING PULSEWIDTH: 0.4 ms
MDC IDC SET ZONE DETECTION INTERVAL: 450 ms
MDC IDC SET ZONE DETECTION INTERVAL: 630 ms
MDC IDC STAT BRADY AP VP PERCENT: 99.07 %
MDC IDC STAT BRADY RA PERCENT PACED: 99.08 %
MDC IDC STAT BRADY RV PERCENT PACED: 99.94 %
Zone Setting Detection Interval: 270 ms
Zone Setting Detection Interval: 370 ms
Zone Setting Detection Interval: 450 ms

## 2014-02-04 NOTE — Telephone Encounter (Signed)
Instructions given to patient for remote transmission.

## 2014-02-04 NOTE — Telephone Encounter (Signed)
New message     Did we get his remote check?

## 2014-02-05 ENCOUNTER — Other Ambulatory Visit: Payer: Self-pay | Admitting: Cardiology

## 2014-02-05 NOTE — Progress Notes (Signed)
Remote ICD transmission.   

## 2014-02-18 ENCOUNTER — Encounter: Payer: Self-pay | Admitting: Cardiology

## 2014-03-25 ENCOUNTER — Other Ambulatory Visit: Payer: Self-pay | Admitting: Cardiology

## 2014-04-10 ENCOUNTER — Encounter (HOSPITAL_COMMUNITY): Payer: Self-pay | Admitting: Internal Medicine

## 2014-05-06 ENCOUNTER — Encounter: Payer: Medicare (Managed Care) | Admitting: Internal Medicine

## 2014-05-14 ENCOUNTER — Other Ambulatory Visit: Payer: Self-pay | Admitting: Cardiology

## 2014-06-09 ENCOUNTER — Encounter: Payer: Self-pay | Admitting: Internal Medicine

## 2014-06-09 ENCOUNTER — Ambulatory Visit (INDEPENDENT_AMBULATORY_CARE_PROVIDER_SITE_OTHER): Payer: Medicare (Managed Care) | Admitting: Internal Medicine

## 2014-06-09 VITALS — BP 116/63 | HR 82 | Ht 62.0 in | Wt 183.0 lb

## 2014-06-09 DIAGNOSIS — I472 Ventricular tachycardia: Secondary | ICD-10-CM

## 2014-06-09 DIAGNOSIS — I5022 Chronic systolic (congestive) heart failure: Secondary | ICD-10-CM

## 2014-06-09 DIAGNOSIS — I255 Ischemic cardiomyopathy: Secondary | ICD-10-CM

## 2014-06-09 DIAGNOSIS — Z4502 Encounter for adjustment and management of automatic implantable cardiac defibrillator: Secondary | ICD-10-CM

## 2014-06-09 DIAGNOSIS — I442 Atrioventricular block, complete: Secondary | ICD-10-CM

## 2014-06-09 DIAGNOSIS — I4729 Other ventricular tachycardia: Secondary | ICD-10-CM

## 2014-06-09 LAB — MDC_IDC_ENUM_SESS_TYPE_INCLINIC
Battery Remaining Longevity: 76 mo
Battery Voltage: 3.01 V
Brady Statistic AP VP Percent: 98.67 %
Brady Statistic AP VS Percent: 0.02 %
Brady Statistic AS VP Percent: 1.31 %
Brady Statistic AS VS Percent: 0 %
Date Time Interrogation Session: 20160208125140
HighPow Impedance: 266 Ohm
HighPow Impedance: 47 Ohm
HighPow Impedance: 59 Ohm
Lead Channel Impedance Value: 437 Ohm
Lead Channel Impedance Value: 456 Ohm
Lead Channel Impedance Value: 456 Ohm
Lead Channel Impedance Value: 494 Ohm
Lead Channel Pacing Threshold Amplitude: 1.25 V
Lead Channel Pacing Threshold Pulse Width: 0.4 ms
Lead Channel Pacing Threshold Pulse Width: 0.4 ms
Lead Channel Setting Pacing Amplitude: 2 V
Lead Channel Setting Pacing Amplitude: 2.25 V
Lead Channel Setting Pacing Amplitude: 2.5 V
Lead Channel Setting Pacing Pulse Width: 0.4 ms
Lead Channel Setting Sensing Sensitivity: 0.3 mV
MDC IDC MSMT LEADCHNL LV IMPEDANCE VALUE: 779 Ohm
MDC IDC MSMT LEADCHNL LV PACING THRESHOLD AMPLITUDE: 1.25 V
MDC IDC MSMT LEADCHNL RA PACING THRESHOLD AMPLITUDE: 1 V
MDC IDC MSMT LEADCHNL RV PACING THRESHOLD PULSEWIDTH: 0.8 ms
MDC IDC SET LEADCHNL RV PACING PULSEWIDTH: 0.8 ms
MDC IDC SET ZONE DETECTION INTERVAL: 370 ms
MDC IDC SET ZONE DETECTION INTERVAL: 630 ms
MDC IDC STAT BRADY RA PERCENT PACED: 98.69 %
MDC IDC STAT BRADY RV PERCENT PACED: 99.92 %
Zone Setting Detection Interval: 270 ms
Zone Setting Detection Interval: 450 ms
Zone Setting Detection Interval: 450 ms

## 2014-06-09 LAB — COMPREHENSIVE METABOLIC PANEL
ALBUMIN: 3.7 g/dL (ref 3.5–5.2)
ALK PHOS: 91 U/L (ref 39–117)
ALT: 20 U/L (ref 0–53)
AST: 20 U/L (ref 0–37)
BUN: 13 mg/dL (ref 6–23)
CALCIUM: 9.2 mg/dL (ref 8.4–10.5)
CHLORIDE: 103 meq/L (ref 96–112)
CO2: 29 meq/L (ref 19–32)
CREATININE: 1.17 mg/dL (ref 0.40–1.50)
GFR: 67.95 mL/min (ref >60.00)
GLUCOSE: 125 mg/dL — AB (ref 70–99)
POTASSIUM: 3.5 meq/L (ref 3.5–5.1)
Sodium: 138 mEq/L (ref 135–145)
TOTAL PROTEIN: 6.8 g/dL (ref 6.0–8.3)
Total Bilirubin: 0.8 mg/dL (ref 0.2–1.2)

## 2014-06-09 LAB — TSH: TSH: 2.87 u[IU]/mL (ref 0.35–4.50)

## 2014-06-09 NOTE — Patient Instructions (Signed)
Your physician has recommended you make the following change in your medication:  1) Take your Lasix as instructed by Dr. Caryl Comes by alternating for the next week.  Then return to normal daily dosing.  Remote monitoring is used to monitor your Pacemaker of ICD from home. This monitoring reduces the number of office visits required to check your device to one time per year. It allows [place] to keep an eye on the functioning of your device to ensure it is working properly. You are scheduled for a device check from home on 09/08/14. You may send your transmission at any time that day. If you have a wireless device, the transmission will be sent automatically. After your physician reviews your transmission, you will receive a postcard with your next transmission date.  Your physician wants you to follow-up in: 1 year with Dr. Caryl Comes.  You will receive a reminder letter in the mail two months in advance. If you don't receive a letter, please call our office to schedule the follow-up appointment.

## 2014-06-09 NOTE — Progress Notes (Signed)
Patient Care Team: No Pcp Per Patient as PCP - General (General Practice)   HPI  Michael Krueger is a 59 y.o. male Seen in followup for VT with ICD-CRT implantation   for congestive heart failure in the setting of ischemic heart cardiomyopathy   He underwent later generator replacement 10/23/13 with a support of Aegis pouch.   He has a history of ventricular tachycardia status post ablation. He also has a history of atrial fibrillation and he takes amiodarone for the combination of the 2. .  Last cath performed in July of 2010 and revealed an occluded RCA but nonobstructive disease in the Lcx and LAD other than ostial 80 in a small marginal; EF 10-15.  Echocardiogram 6/15 demonstrated EF of 15%.  Amiodarone surveillance laboratories were normal June 2015; last digoxin level is measured one year ago and his level was reduced with repeat check of 0.9 5/14   The patient denies chest pain, orthopnea or peripheral edema. He has had some problems recently with PND. He continues to struggle with some shortness of breath. He does not have significant problems with peripheral edema   There have been no palpitations, lightheadedness or syncope.     Past Medical History  Diagnosis Date  . HYPERTHYROIDISM   . HYPERLIPIDEMIA-MIXED   . OBESITY-MORBID (>100')   . CAD   . AV BLOCK, COMPLETE   . VENTRICULAR TACHYCARDIA     S/P RFCA x3  2006, 2010,2010  . Atrial fibrillation   . Chronic systolic heart failure   . TRANSAMINASES, SERUM, ELEVATED   . Long term current use of anticoagulant   . Ischemic cardiomyopathy   . Implantable cardiac defibrillator  medtronic     Initially related 1990s, upgrade to dual-chamber 2002, generator change 2005, generator change August 2010, hematoma evacuation September 2010.  [name] Gynecomastia     Past Surgical History  Procedure Laterality Date  . Splenectomy    . Status post medtronic concerto crt-d in august 2010 with pocket revision    .  Implantable cardioverter defibrillator generator change N/A 10/23/2013    Procedure: IMPLANTABLE CARDIOVERTER DEFIBRILLATOR GENERATOR CHANGE;  Surgeon: Deboraha Sprang, MD;  Location: [facility] CATH LAB;  Service: Cardiovascular;  Laterality: N/A;    Current Outpatient Prescriptions  Medication Sig Dispense Refill  . acetaminophen (TYLENOL) 500 MG tablet Take 1,000 mg by mouth daily as needed for headache.     [name] amiodarone (PACERONE) 200 MG tablet Take 200 mg by mouth daily.    [name] apixaban (ELIQUIS) 5 MG TABS tablet Take 5 mg by mouth 2 (two) times daily.    [name] atorvastatin (LIPITOR) 80 MG tablet Take 80 mg by mouth daily.    . carvedilol (COREG) 12.5 MG tablet TAKE 1 TABLET BY MOUTH TWICE DAILY 60 tablet 6  . digoxin (LANOXIN) 0.125 MG tablet Take 0.0625 mg by mouth daily.    [name] ELIQUIS 5 MG TABS tablet TAKE 1 TABLET BY MOUTH TWICE DAILY 60 tablet 5  . furosemide (LASIX) 40 MG tablet Take 1 tablet (40 mg total) by mouth 2 (two) times daily. 60 tablet 6  . ketoconazole (NIZORAL) 2 % shampoo     . pantoprazole (PROTONIX) 40 MG tablet Take 1 tablet by mouth Daily.    . ramipril (ALTACE) 2.5 MG capsule TAKE 1 CAPSULE BY MOUTH TWICE DAILY 180 capsule 0  . traZODone (DESYREL) 50 MG tablet Take 25-100 mg by mouth daily as needed for sleep.     [name]  triamcinolone cream (KENALOG) 0.1 %      No current facility-administered medications for this visit.    Allergies  Allergen Reactions  . Other Itching and Other (See Comments)    CHG wipes/  Caused a rash    Review of Systems negative except from HPI and PMH  Physical Exam BP 116/63 mmHg  Pulse 82  Ht 5\' 2"  (1.575 m)  Wt 183 lb (83.008 kg)  BMI 33.46 kg/m2 Well developed and well nourished in no acute distress HENT normal E scleral and icterus clear Neck Supple JVP 7-8; carotids brisk and full Clear to ausculation  Swelling over his left breast, notably chronic  Device pocket well healed; without hematoma or erythema.  There is no tethering    Regular rate and rhythm, no murmurs gallops or rub Soft with active bowel sounds No clubbing cyanosis  trEdema Alert and oriented, grossly normal motor and sensory function Skin Warm and Dry  ECG demonstrates AV pacing   Assessment and  Plan  Congestive heart failure-chronic-systolic  Ventricular tachycardia  Implantable defibrillator  Ischemic cardiomyopathy  There has been no interval ventricular tachycardia. We will check amiodarone surveillance laboratories today.  He is mildly volume overloaded. He is having some nocturnal dyspnea. We will increase his diuretic to 40/40--80/40 alternating with 40/44 1 week. He is to see Dr. London Sheer just a couple of weeks; I will ask him to review heart failure status at that time.  We will check his digoxin level. His blood pressures are little bit better than they had been any might tolerate a little bit more ACE inhibitor. We will wait and see how he is doing when he sees Dr. London Sheer in 3 weeks.  The patient's device was interrogated.  The information was reviewed. No changes were made in the programming.

## 2014-06-10 LAB — DIGOXIN LEVEL: Digoxin Level: 0.9 ng/mL (ref 0.8–2.0)

## 2014-06-26 NOTE — Progress Notes (Signed)
HPI: FU coronary artery disease, ischemic cardiomyopathy, history of ICD, history of ventricular tachycardia status post ablation, history of atrial fibrillation on amiodarone therapy. Last cath performed in July of 2010 and revealed an occluded RCA but nonobstructive disease in the Lcx and LAD other than ostial 80 in a small marginal; EF 10-15. He has had CRT and VT ablations. Myoview in April of 2013 showed an ejection fraction of 14%. There was scar but no ischemia. Echo 6/15 showed EF 15, mild LAE and mild MR. Had ICD generator change 6/15. Since last seen,   Current Outpatient Prescriptions  Medication Sig Dispense Refill  . acetaminophen (TYLENOL) 500 MG tablet Take 1,000 mg by mouth daily as needed for headache.     [name] amiodarone (PACERONE) 200 MG tablet Take 200 mg by mouth daily.    [name] apixaban (ELIQUIS) 5 MG TABS tablet Take 5 mg by mouth 2 (two) times daily.    [name] atorvastatin (LIPITOR) 80 MG tablet Take 80 mg by mouth daily.    . carvedilol (COREG) 12.5 MG tablet TAKE 1 TABLET BY MOUTH TWICE DAILY 60 tablet 6  . digoxin (LANOXIN) 0.125 MG tablet Take 0.0625 mg by mouth daily.    [name] ELIQUIS 5 MG TABS tablet TAKE 1 TABLET BY MOUTH TWICE DAILY 60 tablet 5  . furosemide (LASIX) 40 MG tablet Take 1 tablet (40 mg total) by mouth 2 (two) times daily. 60 tablet 6  . ketoconazole (NIZORAL) 2 % shampoo     . pantoprazole (PROTONIX) 40 MG tablet Take 1 tablet by mouth Daily.    . ramipril (ALTACE) 2.5 MG capsule TAKE 1 CAPSULE BY MOUTH TWICE DAILY 180 capsule 0  . traZODone (DESYREL) 50 MG tablet Take 25-100 mg by mouth daily as needed for sleep.     [name] triamcinolone cream (KENALOG) 0.1 %      No current facility-administered medications for this visit.     Past Medical History  Diagnosis Date  . HYPERTHYROIDISM   . HYPERLIPIDEMIA-MIXED   . OBESITY-MORBID (>100')   . CAD   . AV BLOCK, COMPLETE   . VENTRICULAR TACHYCARDIA     S/P RFCA x3  2006, 2010,2010  . Atrial fibrillation     . Chronic systolic heart failure   . TRANSAMINASES, SERUM, ELEVATED   . Long term current use of anticoagulant   . Ischemic cardiomyopathy   . Implantable cardiac defibrillator  medtronic     Initially related 1990s, upgrade to dual-chamber 2002, generator change 2005, generator change August 2010, hematoma evacuation September 2010.  [name] Gynecomastia     Past Surgical History  Procedure Laterality Date  . Splenectomy    . Status post medtronic concerto crt-d in august 2010 with pocket revision    . Implantable cardioverter defibrillator generator change N/A 10/23/2013    Procedure: IMPLANTABLE CARDIOVERTER DEFIBRILLATOR GENERATOR CHANGE;  Surgeon: Deboraha Sprang, MD;  Location: [facility] CATH LAB;  Service: Cardiovascular;  Laterality: N/A;    History   Social History  . Marital Status: Single    Spouse Name: N/A  . Number of Children: N/A  . Years of Education: N/A   Occupational History  . Not on file.   Social History Main Topics  . Smoking status: Former Research scientist (life sciences)  . Smokeless tobacco: Never Used  . Alcohol Use: Yes  . Drug Use: No  . Sexual Activity: Not on file   Other Topics Concern  . Not on file   Social History Narrative  ROS: no fevers or chills, productive cough, hemoptysis, dysphasia, odynophagia, melena, hematochezia, dysuria, hematuria, rash, seizure activity, orthopnea, PND, pedal edema, claudication. Remaining systems are negative.  Physical Exam: Well-developed well-nourished in no acute distress.  Skin is warm and dry.  HEENT is normal.  Neck is supple.  Chest is clear to auscultation with normal expansion.  Cardiovascular exam is regular rate and rhythm.  Abdominal exam nontender or distended. No masses palpated. Extremities show no edema. neuro grossly intact  ECG     This encounter was created in error - please disregard.

## 2014-06-27 ENCOUNTER — Ambulatory Visit (INDEPENDENT_AMBULATORY_CARE_PROVIDER_SITE_OTHER): Payer: Medicare (Managed Care) | Admitting: Cardiology

## 2014-06-27 ENCOUNTER — Encounter: Payer: Self-pay | Admitting: Cardiology

## 2014-06-27 ENCOUNTER — Encounter: Payer: Medicare (Managed Care) | Admitting: Cardiology

## 2014-06-27 VITALS — BP 120/70 | HR 80 | Ht 62.0 in | Wt 182.2 lb

## 2014-06-27 DIAGNOSIS — I472 Ventricular tachycardia: Secondary | ICD-10-CM

## 2014-06-27 DIAGNOSIS — I4729 Other ventricular tachycardia: Secondary | ICD-10-CM

## 2014-06-27 DIAGNOSIS — I5022 Chronic systolic (congestive) heart failure: Secondary | ICD-10-CM

## 2014-06-27 NOTE — Assessment & Plan Note (Signed)
Continue apixaban; check hemoglobin and renal function. Continue amiodarone and beta blocker. Recent liver functions and TSH normal. Check chest x-ray.

## 2014-06-27 NOTE — Assessment & Plan Note (Signed)
Continue statin. 

## 2014-06-27 NOTE — Progress Notes (Signed)
HPI: FU coronary artery disease, ischemic cardiomyopathy, history of ICD, history of ventricular tachycardia status post ablation, history of atrial fibrillation on amiodarone therapy. Last cath performed in July of 2010 and revealed an occluded RCA but nonobstructive disease in the Lcx and LAD other than ostial 80 in a small marginal; EF 10-15. He has had CRT and VT ablations. Myoview in April of 2013 showed an ejection fraction of 14%. There was scar but no ischemia. Echo 6/15 showed EF 15, mild LAE and mild MR. Had ICD generator change 6/15. Since last seen, he notes some increased dyspnea on exertion, orthopnea and weight gain. No chest pain.  Current Outpatient Prescriptions  Medication Sig Dispense Refill  . acetaminophen (TYLENOL) 500 MG tablet Take 1,000 mg by mouth daily as needed for headache.     [name] amiodarone (PACERONE) 200 MG tablet Take 200 mg by mouth daily.    [name] apixaban (ELIQUIS) 5 MG TABS tablet Take 5 mg by mouth 2 (two) times daily.    [name] atorvastatin (LIPITOR) 80 MG tablet Take 80 mg by mouth daily.    . carvedilol (COREG) 12.5 MG tablet TAKE 1 TABLET BY MOUTH TWICE DAILY 60 tablet 6  . digoxin (LANOXIN) 0.125 MG tablet Take 0.0625 mg by mouth daily.    [name] ELIQUIS 5 MG TABS tablet TAKE 1 TABLET BY MOUTH TWICE DAILY 60 tablet 5  . fluocinolone (VANOS) 0.01 % cream Apply 1 application topically daily as needed.  2  . furosemide (LASIX) 40 MG tablet Take 1 tablet (40 mg total) by mouth 2 (two) times daily. 60 tablet 6  . ketoconazole (NIZORAL) 2 % shampoo     . pantoprazole (PROTONIX) 40 MG tablet Take 1 tablet by mouth Daily.    . ramipril (ALTACE) 2.5 MG capsule TAKE 1 CAPSULE BY MOUTH TWICE DAILY 180 capsule 0  . traZODone (DESYREL) 50 MG tablet Take 25-100 mg by mouth daily as needed for sleep.     [name] triamcinolone cream (KENALOG) 0.1 %      No current facility-administered medications for this visit.     Past Medical History  Diagnosis Date  . HYPERTHYROIDISM     . HYPERLIPIDEMIA-MIXED   . OBESITY-MORBID (>100')   . CAD   . AV BLOCK, COMPLETE   . VENTRICULAR TACHYCARDIA     S/P RFCA x3  2006, 2010,2010  . Atrial fibrillation   . Chronic systolic heart failure   . TRANSAMINASES, SERUM, ELEVATED   . Long term current use of anticoagulant   . Ischemic cardiomyopathy   . Implantable cardiac defibrillator  medtronic     Initially related 1990s, upgrade to dual-chamber 2002, generator change 2005, generator change August 2010, hematoma evacuation September 2010.  [name] Gynecomastia     Past Surgical History  Procedure Laterality Date  . Splenectomy    . Status post medtronic concerto crt-d in august 2010 with pocket revision    . Implantable cardioverter defibrillator generator change N/A 10/23/2013    Procedure: IMPLANTABLE CARDIOVERTER DEFIBRILLATOR GENERATOR CHANGE;  Surgeon: Deboraha Sprang, MD;  Location: [facility] CATH LAB;  Service: Cardiovascular;  Laterality: N/A;    History   Social History  . Marital Status: Single    Spouse Name: N/A  . Number of Children: N/A  . Years of Education: N/A   Occupational History  . Not on file.   Social History Main Topics  . Smoking status: Former Research scientist (life sciences)  . Smokeless tobacco: Never Used  . Alcohol  Use: Yes  . Drug Use: No  . Sexual Activity: Not on file   Other Topics Concern  . Not on file   Social History Narrative    ROS: no fevers or chills, productive cough, hemoptysis, dysphasia, odynophagia, melena, hematochezia, dysuria, hematuria, rash, seizure activity, orthopnea, PND, pedal edema, claudication. Remaining systems are negative.  Physical Exam: Well-developed well-nourished in no acute distress.  Skin is warm and dry.  HEENT is normal.  Neck is supple.  Chest is clear to auscultation with normal expansion.  Cardiovascular exam is regular rate and rhythm.  Abdominal exam nontender or distended. No masses palpated. Extremities show no edema. neuro grossly intact

## 2014-06-27 NOTE — Assessment & Plan Note (Signed)
Followed by electrophysiology. 

## 2014-06-27 NOTE — Assessment & Plan Note (Addendum)
Patient complains of increased CHF symptoms. Increase Lasix to 80 mg in the morning and 40 in the evening. In one week check potassium, renal function and BNP. Patient instructed on low sodium diet, fluid restriction. I asked him to weigh himself daily and take additional 40 mg of Lasix for weight gain of 2 pounds.

## 2014-06-27 NOTE — Assessment & Plan Note (Signed)
Continue ACE inhibitor and beta blocker. 

## 2014-06-27 NOTE — Patient Instructions (Signed)
Your physician recommends that you schedule a follow-up appointment in: [place] physician recommends that you schedule a follow-up appointment in: 8-12 [place]  Your physician recommends that you return for lab work WHEN YOU RETURN TO THE OFFICE  A chest x-ray takes a picture of the organs and structures inside the chest, including the heart, lungs, and blood vessels. This test can show several things, including, whether the heart is enlarges; whether fluid is building up in the lungs; and whether pacemaker / defibrillator leads are still in place.

## 2014-06-27 NOTE — Assessment & Plan Note (Signed)
Continue statin. Not on aspirin given need for anticoagulation. 

## 2014-06-27 NOTE — Assessment & Plan Note (Signed)
Continue amiodarone.

## 2014-07-14 ENCOUNTER — Ambulatory Visit (INDEPENDENT_AMBULATORY_CARE_PROVIDER_SITE_OTHER): Payer: Medicare (Managed Care) | Admitting: Physician Assistant

## 2014-07-14 ENCOUNTER — Other Ambulatory Visit: Payer: Self-pay | Admitting: Cardiology

## 2014-07-14 ENCOUNTER — Encounter: Payer: Self-pay | Admitting: Physician Assistant

## 2014-07-14 VITALS — BP 120/80 | HR 62 | Ht 62.0 in | Wt 180.0 lb

## 2014-07-14 DIAGNOSIS — Z95810 Presence of automatic (implantable) cardiac defibrillator: Secondary | ICD-10-CM

## 2014-07-14 DIAGNOSIS — I472 Ventricular tachycardia, unspecified: Secondary | ICD-10-CM

## 2014-07-14 DIAGNOSIS — I5022 Chronic systolic (congestive) heart failure: Secondary | ICD-10-CM

## 2014-07-14 DIAGNOSIS — I2510 Atherosclerotic heart disease of native coronary artery without angina pectoris: Secondary | ICD-10-CM

## 2014-07-14 DIAGNOSIS — I255 Ischemic cardiomyopathy: Secondary | ICD-10-CM

## 2014-07-14 DIAGNOSIS — I4729 Other ventricular tachycardia: Secondary | ICD-10-CM

## 2014-07-14 DIAGNOSIS — Z7901 Long term (current) use of anticoagulants: Secondary | ICD-10-CM

## 2014-07-14 DIAGNOSIS — I480 Paroxysmal atrial fibrillation: Secondary | ICD-10-CM

## 2014-07-14 NOTE — Patient Instructions (Signed)
Continue your current medications. Labs today:  BMET, BNP, CBC Keep your follow up with Dr. Kirk Ruths next month. Weigh daily and call if:  Weight up 3 lbs in one day, increased swelling or increased shortness of breath.

## 2014-07-14 NOTE — Progress Notes (Signed)
Cardiology Office Note   Date:  07/14/2014   ID:  Michael Krueger, DOB Jun 23, 1955, MRN [id number]  PCP:  No PCP Per Patient  Cardiologist:  Dr. Kirk Ruths   Electrophysiologist:  Dr. [name]   Chief Complaint  Patient presents with  . Atrial Fibrillation     History of Present Illness: Michael Krueger is a 59 y.o. male with a hx of CAD, ischemic cardiomyopathy, systolic HF, status post BiV-ICD, V. tach status post ablation, atrial fibrillation on amiodarone.  Last cardiac catheterization in 10/2008 demonstrated an occluded RCA and nonobstructive disease in the circumflex and LAD, 80% small marginal. Medical therapy was continued. Myoview in 08/2011 demonstrated scar but no ischemia. Last echocardiogram 6/15 demonstrated EF 15%. Last seen by Dr. Stanford Breed 06/27/14.  He was felt to be volume overloaded. Lasix was adjusted and he is brought back for close follow-up.  Since last seen, he is doing well. His breathing has improved. LE edema has resolved. He denies chest pain. He denies orthopnea, PND. He denies syncope or palpitations.  Studies/Reports Reviewed Today:  Echo 09/2013 - EF 15%. There is akinesis of the anteroseptal, inferior, and apical myocardium. Grade 1 diastolic dysfunction.  - Mitral valve: Calcified annulus. There was mild regurgitation. - Left atrium: The atrium was mildly dilated. Impressions:  Anteroseptal and apical akinesis; overall severely reduced LV function; using contrast no apical thrombus noted.  Myoview 08/2011 Large area of scar in the anterior, anteroseptal, inferoseptal, inferior, anterolateral and apical distributions.   No signif ischemia. LV Ejection Fraction: 14%.  LV Wall Motion:  Severe diffuse hypokinesis, inferior, apical akinesis.  Cardiac catheterization 10/2008 LM: Okay LAD: Stent patent with 30% ISR, mid and distal 30% LCx: Tiny marginal branch 80% proximal, proximal to this stent patent with 30% ISR, mid CFX 40% RCA: 70%  after RV branch then occluded (bridging collaterals) EF 10-15%   Past Medical History  Diagnosis Date  . HYPERTHYROIDISM   . HYPERLIPIDEMIA-MIXED   . OBESITY-MORBID (>100')   . CAD   . AV BLOCK, COMPLETE   . VENTRICULAR TACHYCARDIA     S/P RFCA x3  2006, 2010,2010  . Atrial fibrillation   . Chronic systolic heart failure   . TRANSAMINASES, SERUM, ELEVATED   . Long term current use of anticoagulant   . Ischemic cardiomyopathy   . Implantable cardiac defibrillator  medtronic     Initially related 1990s, upgrade to dual-chamber 2002, generator change 2005, generator change August 2010, hematoma evacuation September 2010.  [name] Gynecomastia     Past Surgical History  Procedure Laterality Date  . Splenectomy    . Status post medtronic concerto crt-d in august 2010 with pocket revision    . Implantable cardioverter defibrillator generator change N/A 10/23/2013    Procedure: IMPLANTABLE CARDIOVERTER DEFIBRILLATOR GENERATOR CHANGE;  Surgeon: Deboraha Sprang, MD;  Location: [facility] CATH LAB;  Service: Cardiovascular;  Laterality: N/A;     Current Outpatient Prescriptions  Medication Sig Dispense Refill  . acetaminophen (TYLENOL) 500 MG tablet Take 1,000 mg by mouth daily as needed for headache.     [name] amiodarone (PACERONE) 200 MG tablet Take 200 mg by mouth daily.    [name] apixaban (ELIQUIS) 5 MG TABS tablet Take 5 mg by mouth 2 (two) times daily.    [name] atorvastatin (LIPITOR) 80 MG tablet Take 80 mg by mouth daily.    . carvedilol (COREG) 12.5 MG tablet TAKE 1 TABLET BY MOUTH TWICE DAILY 60 tablet 6  . digoxin (LANOXIN)  0.125 MG tablet Take 0.0625 mg by mouth daily.    [name] ELIQUIS 5 MG TABS tablet TAKE 1 TABLET BY MOUTH TWICE DAILY 60 tablet 5  . fluocinolone (VANOS) 0.01 % cream Apply 1 application topically daily as needed.  2  . furosemide (LASIX) 40 MG tablet Take 1 tablet (40 mg total) by mouth 2 (two) times daily. 60 tablet 6  . ketoconazole (NIZORAL) 2 % shampoo Apply 1 application topically 2  (two) times a week.     . pantoprazole (PROTONIX) 40 MG tablet Take 1 tablet by mouth Daily.    . ramipril (ALTACE) 2.5 MG capsule TAKE 1 CAPSULE BY MOUTH TWICE DAILY 180 capsule 0  . traZODone (DESYREL) 50 MG tablet Take 25-100 mg by mouth daily as needed for sleep.     [name] triamcinolone cream (KENALOG) 0.1 % Apply 1 application topically as needed.     . VENTOLIN HFA 108 (90 BASE) MCG/ACT inhaler 2 (two) times daily.    . digoxin (DIGOX) 0.125 MG tablet Take 0.5 tablets (0.0625 mg total) by mouth daily. 15 tablet 10   No current facility-administered medications for this visit.    Allergies:   Other    Social History:  The patient  reports that he has quit smoking. He has never used smokeless tobacco. He reports that he drinks alcohol. He reports that he does not use illicit drugs.   Family History:  The patient's family history includes Cancer in his mother; Heart attack in his maternal grandfather; Hypertension in his sister; Stroke in his paternal grandfather.    ROS:   Please see the history of present illness.   Review of Systems  Respiratory: Positive for wheezing.        Ex-smoker. Has a history of COPD  All other systems reviewed and are negative.     PHYSICAL EXAM: VS:  BP 120/80 mmHg  Pulse 62  Ht 5\' 2"  (1.575 m)  Wt 180 lb (81.647 kg)  BMI 32.91 kg/m2    Wt Readings from Last 3 Encounters:  07/14/14 180 lb (81.647 kg)  06/27/14 182 lb 3.2 oz (82.645 kg)  06/09/14 183 lb (83.008 kg)     GEN: Well nourished, well developed, in no acute distress HEENT: normal Neck: no JVD, no masses Cardiac:  Normal S1/S2, RRR; no murmur ,  no rubs or gallops, no edema  Respiratory:  clear to auscultation bilaterally, no wheezing, rhonchi or rales. GI: soft, nontender, nondistended, + BS MS: no deformity or atrophy Skin: warm and dry  Neuro:  CNs II-XII intact, Strength and sensation are intact Psych: Normal affect   EKG:  EKG is ordered today.  It demonstrates:   AV  paced, HR 62   Recent Labs: 10/01/2013: Pro B Natriuretic peptide (BNP) 188.0* 12/25/2013: Hemoglobin 15.7; Platelets 232 06/09/2014: ALT 20; BUN 13; Creatinine 1.17; Potassium 3.5; Sodium 138; TSH 2.87    Lipid Panel    Component Value Date/Time   CHOL 138 03/25/2013 1428   TRIG 130.0 03/25/2013 1428   HDL 29.30* 03/25/2013 1428   CHOLHDL 5 03/25/2013 1428   VLDL 26.0 03/25/2013 1428   LDLCALC 83 03/25/2013 1428   LDLDIRECT 96.7 08/08/2011 0950      ASSESSMENT AND PLAN:  Chronic systolic CHF (congestive heart failure) Volume improved. Breathing is improved. He is NYHA 2-2b. Continue current dose of Lasix. Check follow-up basic metabolic panel and BNP today.  Ischemic cardiomyopathy Continue beta blocker, digoxin, ACE inhibitor.  Coronary artery disease involving native  coronary artery of native heart without angina pectoris No angina. Continue statin. He is not on aspirin as he is on Eliquis.  VENTRICULAR TACHYCARDIA Continue amiodarone. Recent LFTs and TSH optimal.  PAF (paroxysmal atrial fibrillation) Maintaining NSR. He remains on Eliquis. Check follow-up CBC, basic metabolic panel.  Automatic implantable cardioverter-defibrillator in situ Follow-up with EP as planned.  Long term current use of anticoagulant  Check follow-up CBC.  Current medicines are reviewed at length with the patient today.  The patient does not have concerns regarding medicines.  The following changes have been made:  As above.   Labs/ tests ordered today include:   Orders Placed This Encounter  Procedures  . Basic metabolic panel  . Brain natriuretic peptide  . CBC with Differential/Platelet  . EKG 12-Lead    Disposition:   FU with Dr. Kirk Ruths as planned.    Signed, Versie Starks, MHS 07/14/2014 4:41 PM    [facility] [place], [place], [place]  [address] Phone: [phone]; Fax: [phone]

## 2014-07-14 NOTE — Telephone Encounter (Signed)
Rx refill sent to patient pharmacy   

## 2014-07-15 LAB — CBC WITH DIFFERENTIAL/PLATELET
Basophils Absolute: 0 10*3/uL (ref 0.0–0.1)
Basophils Relative: 0.5 % (ref 0.0–3.0)
EOS PCT: 1 % (ref 0.0–5.0)
Eosinophils Absolute: 0.1 10*3/uL (ref 0.0–0.7)
HEMATOCRIT: 48.3 % (ref 39.0–52.0)
Hemoglobin: 16.3 g/dL (ref 13.0–17.0)
LYMPHS ABS: 2.7 10*3/uL (ref 0.7–4.0)
Lymphocytes Relative: 26.3 % (ref 12.0–46.0)
MCHC: 33.8 g/dL (ref 30.0–36.0)
MCV: 96.3 fl (ref 78.0–100.0)
MONO ABS: 1 10*3/uL (ref 0.1–1.0)
Monocytes Relative: 9.6 % (ref 3.0–12.0)
Neutro Abs: 6.4 10*3/uL (ref 1.4–7.7)
Neutrophils Relative %: 62.6 % (ref 43.0–77.0)
PLATELETS: 239 10*3/uL (ref 150.0–400.0)
RBC: 5.02 Mil/uL (ref 4.22–5.81)
RDW: 14.6 % (ref 11.5–15.5)
WBC: 10.2 10*3/uL (ref 4.0–10.5)

## 2014-07-15 LAB — BASIC METABOLIC PANEL
BUN: 16 mg/dL (ref 6–23)
CO2: 32 meq/L (ref 19–32)
Calcium: 9.5 mg/dL (ref 8.4–10.5)
Chloride: 100 mEq/L (ref 96–112)
Creatinine, Ser: 1.35 mg/dL (ref 0.40–1.50)
GFR: 57.59 mL/min — ABNORMAL LOW (ref >60.00)
GLUCOSE: 188 mg/dL — AB (ref 70–99)
POTASSIUM: 4.5 meq/L (ref 3.5–5.1)
Sodium: 136 mEq/L (ref 135–145)

## 2014-07-15 LAB — BRAIN NATRIURETIC PEPTIDE: PRO B NATRI PEPTIDE: 114 pg/mL — AB (ref 0.0–100.0)

## 2014-07-21 ENCOUNTER — Telehealth: Payer: Self-pay | Admitting: *Deleted

## 2014-07-21 NOTE — Telephone Encounter (Signed)
pt notified of lab results. When I advised pt to f/u PCP due to elevated glucose and that the PA did not see Diabetes listed in his chart; pt states he has not ever been told he was diabetic. Will fax labs to [facility] in [place]. [phone].

## 2014-07-24 ENCOUNTER — Other Ambulatory Visit: Payer: Self-pay | Admitting: Internal Medicine

## 2014-07-30 ENCOUNTER — Other Ambulatory Visit: Payer: Self-pay | Admitting: Cardiology

## 2014-08-01 ENCOUNTER — Other Ambulatory Visit: Payer: Self-pay | Admitting: Cardiology

## 2014-08-07 ENCOUNTER — Other Ambulatory Visit: Payer: Self-pay | Admitting: *Deleted

## 2014-08-07 MED ORDER — DIGOXIN 125 MCG PO TABS: 0.0625 mg | ORAL_TABLET | Freq: Every day | ORAL | Status: DC

## 2014-08-12 NOTE — Progress Notes (Signed)
HPI: FU coronary artery disease, ischemic cardiomyopathy, history of ICD, history of ventricular tachycardia status post ablation, history of atrial fibrillation on amiodarone therapy. Last cath performed in July of 2010 and revealed an occluded RCA but nonobstructive disease in the Lcx and LAD other than ostial 80 in a small marginal; EF 10-15. He has had CRT and VT ablations. Myoview in April of 2013 showed an ejection fraction of 14%. There was scar but no ischemia. Echo 6/15 showed EF 15, mild LAE and mild MR. Had ICD generator change 6/15. Since last seen,    Current Outpatient Prescriptions  Medication Sig Dispense Refill  . acetaminophen (TYLENOL) 500 MG tablet Take 1,000 mg by mouth daily as needed for headache.     [name] amiodarone (PACERONE) 200 MG tablet TAKE 1 TABLET BY MOUTH EVERY MORNING AND 1/2 TABLET BY MOUTH IN THE EVENING 45 tablet 1  . apixaban (ELIQUIS) 5 MG TABS tablet Take 5 mg by mouth 2 (two) times daily.    [name] atorvastatin (LIPITOR) 80 MG tablet Take 80 mg by mouth daily.    . carvedilol (COREG) 12.5 MG tablet TAKE 1 TABLET BY MOUTH TWICE DAILY 60 tablet 6  . digoxin (DIGOX) 0.125 MG tablet Take 0.5 tablets (0.0625 mg total) by mouth daily. 90 tablet 3  . digoxin (LANOXIN) 0.125 MG tablet Take 0.0625 mg by mouth daily.    [name] ELIQUIS 5 MG TABS tablet TAKE 1 TABLET BY MOUTH TWICE DAILY 60 tablet 5  . fluocinolone (VANOS) 0.01 % cream Apply 1 application topically daily as needed.  2  . furosemide (LASIX) 40 MG tablet TAKE 1 TABLET BY MOUTH TWICE DAILY 60 tablet 6  . ketoconazole (NIZORAL) 2 % shampoo Apply 1 application topically 2 (two) times a week.     . pantoprazole (PROTONIX) 40 MG tablet Take 1 tablet by mouth Daily.    . ramipril (ALTACE) 2.5 MG capsule TAKE 1 CAPSULE BY MOUTH TWICE DAILY 180 capsule 0  . traZODone (DESYREL) 50 MG tablet Take 25-100 mg by mouth daily as needed for sleep.     [name] triamcinolone cream (KENALOG) 0.1 % Apply 1 application topically as  needed.     . VENTOLIN HFA 108 (90 BASE) MCG/ACT inhaler 2 (two) times daily.     No current facility-administered medications for this visit.     Past Medical History  Diagnosis Date  . HYPERTHYROIDISM   . HYPERLIPIDEMIA-MIXED   . OBESITY-MORBID (>100')   . CAD   . AV BLOCK, COMPLETE   . VENTRICULAR TACHYCARDIA     S/P RFCA x3  2006, 2010,2010  . Atrial fibrillation   . Chronic systolic heart failure   . TRANSAMINASES, SERUM, ELEVATED   . Long term current use of anticoagulant   . Ischemic cardiomyopathy   . Implantable cardiac defibrillator  medtronic     Initially related 1990s, upgrade to dual-chamber 2002, generator change 2005, generator change August 2010, hematoma evacuation September 2010.  [name] Gynecomastia     Past Surgical History  Procedure Laterality Date  . Splenectomy    . Status post medtronic concerto crt-d in august 2010 with pocket revision    . Implantable cardioverter defibrillator generator change N/A 10/23/2013    Procedure: IMPLANTABLE CARDIOVERTER DEFIBRILLATOR GENERATOR CHANGE;  Surgeon: Deboraha Sprang, MD;  Location: [facility] CATH LAB;  Service: Cardiovascular;  Laterality: N/A;    History   Social History  . Marital Status: Single    Spouse Name: N/A  .  Number of Children: N/A  . Years of Education: N/A   Occupational History  . Not on file.   Social History Main Topics  . Smoking status: Former Research scientist (life sciences)  . Smokeless tobacco: Never Used  . Alcohol Use: Yes  . Drug Use: No  . Sexual Activity: Not on file   Other Topics Concern  . Not on file   Social History Narrative    ROS: no fevers or chills, productive cough, hemoptysis, dysphasia, odynophagia, melena, hematochezia, dysuria, hematuria, rash, seizure activity, orthopnea, PND, pedal edema, claudication. Remaining systems are negative.  Physical Exam: Well-developed well-nourished in no acute distress.  Skin is warm and dry.  HEENT is normal.  Neck is supple.  Chest is clear to  auscultation with normal expansion.  Cardiovascular exam is regular rate and rhythm.  Abdominal exam nontender or distended. No masses palpated. Extremities show no edema. neuro grossly intact  ECG     This encounter was created in error - please disregard.

## 2014-08-13 ENCOUNTER — Other Ambulatory Visit: Payer: Self-pay | Admitting: Cardiology

## 2014-08-15 ENCOUNTER — Encounter: Payer: Medicare (Managed Care) | Admitting: Cardiology

## 2014-08-19 ENCOUNTER — Encounter: Payer: Self-pay | Admitting: Cardiology

## 2014-08-25 ENCOUNTER — Other Ambulatory Visit: Payer: Self-pay | Admitting: *Deleted

## 2014-08-25 ENCOUNTER — Other Ambulatory Visit: Payer: Self-pay | Admitting: Cardiology

## 2014-08-25 MED ORDER — AMIODARONE HCL 200 MG PO TABS: ORAL_TABLET | ORAL | Status: DC

## 2014-09-04 ENCOUNTER — Other Ambulatory Visit: Payer: Self-pay | Admitting: *Deleted

## 2014-09-04 MED ORDER — CARVEDILOL 12.5 MG PO TABS: 12.5000 mg | ORAL_TABLET | Freq: Two times a day (BID) | ORAL | Status: DC

## 2014-09-04 NOTE — Telephone Encounter (Signed)
Rx(s) sent to pharmacy electronically.  

## 2014-09-08 ENCOUNTER — Encounter: Payer: Medicare (Managed Care) | Admitting: *Deleted

## 2014-09-08 ENCOUNTER — Telehealth: Payer: Self-pay | Admitting: Cardiology

## 2014-09-08 NOTE — Telephone Encounter (Signed)
Attempted to confirm remote transmission with pt. No answer and was unable to leave a message.   

## 2014-09-12 ENCOUNTER — Encounter: Payer: Self-pay | Admitting: Cardiology

## 2014-09-18 ENCOUNTER — Other Ambulatory Visit: Payer: Self-pay

## 2014-09-18 MED ORDER — RAMIPRIL 2.5 MG PO CAPS: 2.5000 mg | ORAL_CAPSULE | Freq: Two times a day (BID) | ORAL | Status: DC

## 2014-09-19 ENCOUNTER — Ambulatory Visit (INDEPENDENT_AMBULATORY_CARE_PROVIDER_SITE_OTHER): Payer: Medicare (Managed Care) | Admitting: *Deleted

## 2014-09-19 DIAGNOSIS — I5022 Chronic systolic (congestive) heart failure: Secondary | ICD-10-CM

## 2014-09-19 DIAGNOSIS — I255 Ischemic cardiomyopathy: Secondary | ICD-10-CM

## 2014-09-23 NOTE — Progress Notes (Signed)
Remote ICD transmission.   

## 2014-09-24 LAB — CUP PACEART REMOTE DEVICE CHECK
Battery Remaining Longevity: 74 mo
Battery Voltage: 2.99 V
Brady Statistic AP VP Percent: 98.69 %
Brady Statistic RA Percent Paced: 98.75 %
Brady Statistic RV Percent Paced: 99.83 %
HighPow Impedance: 47 Ohm
HighPow Impedance: 59 Ohm
Lead Channel Impedance Value: 722 Ohm
Lead Channel Pacing Threshold Amplitude: 1.375 V
Lead Channel Pacing Threshold Pulse Width: 0.4 ms
Lead Channel Sensing Intrinsic Amplitude: 1.875 mV
Lead Channel Setting Pacing Pulse Width: 0.4 ms
Lead Channel Setting Sensing Sensitivity: 0.3 mV
MDC IDC MSMT LEADCHNL LV IMPEDANCE VALUE: 399 Ohm
MDC IDC MSMT LEADCHNL LV IMPEDANCE VALUE: 437 Ohm
MDC IDC MSMT LEADCHNL RA IMPEDANCE VALUE: 437 Ohm
MDC IDC MSMT LEADCHNL RV IMPEDANCE VALUE: 323 Ohm
MDC IDC MSMT LEADCHNL RV IMPEDANCE VALUE: 494 Ohm
MDC IDC SESS DTM: 20160520200032
MDC IDC SET LEADCHNL LV PACING AMPLITUDE: 2.25 V
MDC IDC SET LEADCHNL LV PACING PULSEWIDTH: 0.4 ms
MDC IDC SET LEADCHNL RA PACING AMPLITUDE: 2 V
MDC IDC SET LEADCHNL RV PACING AMPLITUDE: 2.75 V
MDC IDC SET ZONE DETECTION INTERVAL: 270 ms
MDC IDC SET ZONE DETECTION INTERVAL: 450 ms
MDC IDC STAT BRADY AP VS PERCENT: 0.06 %
MDC IDC STAT BRADY AS VP PERCENT: 1.25 %
MDC IDC STAT BRADY AS VS PERCENT: 0 %
Zone Setting Detection Interval: 370 ms
Zone Setting Detection Interval: 450 ms
Zone Setting Detection Interval: 630 ms

## 2014-10-01 ENCOUNTER — Other Ambulatory Visit: Payer: Self-pay | Admitting: Cardiology

## 2014-10-03 ENCOUNTER — Other Ambulatory Visit: Payer: Self-pay | Admitting: Cardiology

## 2014-10-03 NOTE — Telephone Encounter (Signed)
Rx has been sent to the pharmacy electronically. ° °

## 2014-10-08 ENCOUNTER — Encounter: Payer: Self-pay | Admitting: Cardiology

## 2014-10-15 ENCOUNTER — Encounter: Payer: Self-pay | Admitting: Internal Medicine

## 2014-11-24 ENCOUNTER — Other Ambulatory Visit: Payer: Self-pay

## 2014-11-24 MED ORDER — AMIODARONE HCL 200 MG PO TABS: ORAL_TABLET | ORAL | Status: DC

## 2014-12-23 ENCOUNTER — Ambulatory Visit (INDEPENDENT_AMBULATORY_CARE_PROVIDER_SITE_OTHER): Payer: Medicare (Managed Care) | Admitting: *Deleted

## 2014-12-23 DIAGNOSIS — I255 Ischemic cardiomyopathy: Secondary | ICD-10-CM | POA: Diagnosis not present

## 2014-12-23 DIAGNOSIS — I5022 Chronic systolic (congestive) heart failure: Secondary | ICD-10-CM | POA: Diagnosis not present

## 2014-12-23 NOTE — Progress Notes (Signed)
Remote ICD transmission.   

## 2015-01-02 LAB — CUP PACEART REMOTE DEVICE CHECK
Battery Remaining Longevity: 70 mo
Brady Statistic AP VP Percent: 96.01 %
Brady Statistic AS VS Percent: 0 %
Brady Statistic RA Percent Paced: 96.03 %
Brady Statistic RV Percent Paced: 99.87 %
Date Time Interrogation Session: 20160823084223
HIGH POWER IMPEDANCE MEASURED VALUE: 52 Ohm
HighPow Impedance: 41 Ohm
Lead Channel Impedance Value: 380 Ohm
Lead Channel Impedance Value: 399 Ohm
Lead Channel Impedance Value: 494 Ohm
Lead Channel Impedance Value: 703 Ohm
Lead Channel Pacing Threshold Amplitude: 0.875 V
Lead Channel Pacing Threshold Pulse Width: 0.4 ms
Lead Channel Sensing Intrinsic Amplitude: 1.875 mV
Lead Channel Sensing Intrinsic Amplitude: 1.875 mV
Lead Channel Setting Pacing Amplitude: 2 V
Lead Channel Setting Pacing Amplitude: 2.75 V
Lead Channel Setting Pacing Pulse Width: 0.4 ms
Lead Channel Setting Pacing Pulse Width: 0.4 ms
Lead Channel Setting Sensing Sensitivity: 0.3 mV
MDC IDC MSMT BATTERY VOLTAGE: 2.99 V
MDC IDC MSMT LEADCHNL LV IMPEDANCE VALUE: 437 Ohm
MDC IDC MSMT LEADCHNL RV IMPEDANCE VALUE: 323 Ohm
MDC IDC SET LEADCHNL LV PACING AMPLITUDE: 2.25 V
MDC IDC SET ZONE DETECTION INTERVAL: 630 ms
MDC IDC STAT BRADY AP VS PERCENT: 0.02 %
MDC IDC STAT BRADY AS VP PERCENT: 3.97 %
Zone Setting Detection Interval: 270 ms
Zone Setting Detection Interval: 370 ms
Zone Setting Detection Interval: 450 ms
Zone Setting Detection Interval: 450 ms

## 2015-01-07 ENCOUNTER — Telehealth: Payer: Self-pay | Admitting: Cardiology

## 2015-01-07 NOTE — Telephone Encounter (Signed)
Pt called and stated that he is device is making an alert tone. Pt feels fine and will send a transmission with his home monitor tonight when he gets home.

## 2015-01-08 NOTE — Telephone Encounter (Signed)
Transmission reviewed- no alerts. Pt reports one Michael Krueger steady tone from his device yesterday x1 while he was holding a hand-held motor. Advised pt that the motor caused the device to trigger "magnet mode" and device resumed normal function when he stepped away from motor. Pt has no symptoms and is appreciative of call.

## 2015-01-09 ENCOUNTER — Encounter: Payer: Self-pay | Admitting: *Deleted

## 2015-01-21 ENCOUNTER — Encounter: Payer: Self-pay | Admitting: Internal Medicine

## 2015-03-17 ENCOUNTER — Other Ambulatory Visit: Payer: Self-pay | Admitting: Cardiology

## 2015-03-30 ENCOUNTER — Ambulatory Visit (INDEPENDENT_AMBULATORY_CARE_PROVIDER_SITE_OTHER): Payer: Medicare (Managed Care) | Admitting: *Deleted

## 2015-03-30 DIAGNOSIS — I255 Ischemic cardiomyopathy: Secondary | ICD-10-CM | POA: Diagnosis not present

## 2015-03-30 DIAGNOSIS — I5022 Chronic systolic (congestive) heart failure: Secondary | ICD-10-CM

## 2015-03-31 NOTE — Progress Notes (Signed)
Remote ICD transmission.   

## 2015-04-01 NOTE — Progress Notes (Signed)
HPI: FU CAD, ischemic cardiomyopathy, systolic HF, status post BiV-ICD, V. tach status post ablation, atrial fibrillation on amiodarone. Last cardiac catheterization in 10/2008 demonstrated an occluded RCA and nonobstructive disease in the circumflex and LAD, 80% small marginal. Medical therapy was continued. Myoview in 08/2011 demonstrated scar but no ischemia.   Since last seen, the patient has dyspnea with more extreme activities but not with routine activities. It is relieved with rest. It is not associated with chest pain. There is no orthopnea, PND or pedal edema. There is no syncope or palpitations. There is no exertional chest pain.   Studies/Reports Reviewed Today:  Echo 09/2013 - EF 15%. There is akinesis of the anteroseptal, inferior, and apical myocardium. Grade 1 diastolic dysfunction.  - Mitral valve: Calcified annulus. There was mild regurgitation. - Left atrium: The atrium was mildly dilated. Impressions: Anteroseptal and apical akinesis; overall severely reduced LVfunction; using contrast no apical thrombus noted.  Myoview 08/2011 Large area of scar in the anterior, anteroseptal, inferoseptal, inferior, anterolateral and apical distributions.  No signif ischemia. LV Ejection Fraction: 14%. LV Wall Motion: Severe diffuse hypokinesis, inferior, apical akinesis.  Cardiac catheterization 10/2008 LM: Okay LAD: Stent patent with 30% ISR, mid and distal 30% LCx: Tiny marginal branch 80% proximal, proximal to this stent patent with 30% ISR, mid CFX 40% RCA: 70% after RV branch then occluded (bridging collaterals) EF 10-15%  Current Outpatient Prescriptions  Medication Sig Dispense Refill  . acetaminophen (TYLENOL) 500 MG tablet Take 1,000 mg by mouth daily as needed for headache.     [name] amiodarone (PACERONE) 200 MG tablet Take 1 tablet by mouth every morning and 1/2 tablet in the evening 135 tablet 3  . apixaban (ELIQUIS) 5 MG TABS tablet Take 5 mg by mouth 2 (two)  times daily.    [name] atorvastatin (LIPITOR) 80 MG tablet Take 80 mg by mouth daily.    [name] atorvastatin (LIPITOR) 80 MG tablet TAKE 1 TABLET BY MOUTH DAILY 90 tablet 2  . carvedilol (COREG) 12.5 MG tablet Take 1 tablet (12.5 mg total) by mouth 2 (two) times daily. 180 tablet 2  . digoxin (DIGOX) 0.125 MG tablet Take 0.5 tablets (0.0625 mg total) by mouth daily. 90 tablet 3  . digoxin (LANOXIN) 0.125 MG tablet Take 0.0625 mg by mouth daily.    [name] ELIQUIS 5 MG TABS tablet TAKE 1 TABLET BY MOUTH TWICE A DAY 60 tablet 3  . fluocinolone (VANOS) 0.01 % cream Apply 1 application topically daily as needed.  2  . furosemide (LASIX) 40 MG tablet TAKE 1 TABLET BY MOUTH TWICE DAILY 60 tablet 6  . ketoconazole (NIZORAL) 2 % shampoo Apply 1 application topically 2 (two) times a week.     . pantoprazole (PROTONIX) 40 MG tablet Take 1 tablet by mouth Daily.    . ramipril (ALTACE) 2.5 MG capsule Take 1 capsule (2.5 mg total) by mouth 2 (two) times daily. 60 capsule 0  . ramipril (ALTACE) 2.5 MG capsule TAKE 1 CAPSULE BY MOUTH TWICE DAILY 60 capsule 0  . traZODone (DESYREL) 50 MG tablet Take 25-100 mg by mouth daily as needed for sleep.     [name] triamcinolone cream (KENALOG) 0.1 % Apply 1 application topically as needed.     . VENTOLIN HFA 108 (90 BASE) MCG/ACT inhaler 2 (two) times daily.     No current facility-administered medications for this visit.     Past Medical History  Diagnosis Date  . HYPERTHYROIDISM   .  HYPERLIPIDEMIA-MIXED   . OBESITY-MORBID (>100')   . CAD   . AV BLOCK, COMPLETE   . VENTRICULAR TACHYCARDIA     S/P RFCA x3  2006, 2010,2010  . Atrial fibrillation ([place])   . Chronic systolic heart failure ([place])   . TRANSAMINASES, SERUM, ELEVATED   . Long term current use of anticoagulant   . Ischemic cardiomyopathy   . Implantable cardiac defibrillator  medtronic     Initially related 1990s, upgrade to dual-chamber 2002, generator change 2005, generator change August 2010, hematoma evacuation  September 2010.  [name] Gynecomastia     Past Surgical History  Procedure Laterality Date  . Splenectomy    . Status post medtronic concerto crt-d in august 2010 with pocket revision    . Implantable cardioverter defibrillator generator change N/A 10/23/2013    Procedure: IMPLANTABLE CARDIOVERTER DEFIBRILLATOR GENERATOR CHANGE;  Surgeon: Deboraha Sprang, MD;  Location: [facility] CATH LAB;  Service: Cardiovascular;  Laterality: N/A;    Social History   Social History  . Marital Status: Single    Spouse Name: N/A  . Number of Children: N/A  . Years of Education: N/A   Occupational History  . Not on file.   Social History Main Topics  . Smoking status: Former Research scientist (life sciences)  . Smokeless tobacco: Never Used  . Alcohol Use: Yes  . Drug Use: No  . Sexual Activity: Not on file   Other Topics Concern  . Not on file   Social History Narrative    ROS: no fevers or chills, productive cough, hemoptysis, dysphasia, odynophagia, melena, hematochezia, dysuria, hematuria, rash, seizure activity, orthopnea, PND, pedal edema, claudication. Remaining systems are negative.  Physical Exam: Well-developed well-nourished in no acute distress.  Skin is warm and dry.  HEENT is normal.  Neck is supple.  Chest is clear to auscultation with normal expansion.  Cardiovascular exam is regular rate and rhythm.  Abdominal exam nontender or distended. No masses palpated. Extremities show no edema. neuro grossly intact  ECG AV paced.

## 2015-04-09 ENCOUNTER — Ambulatory Visit (INDEPENDENT_AMBULATORY_CARE_PROVIDER_SITE_OTHER): Payer: Medicare (Managed Care) | Admitting: Cardiology

## 2015-04-09 ENCOUNTER — Encounter: Payer: Self-pay | Admitting: Cardiology

## 2015-04-09 ENCOUNTER — Other Ambulatory Visit: Payer: Self-pay | Admitting: *Deleted

## 2015-04-09 VITALS — BP 110/78 | HR 81 | Ht 62.0 in | Wt 167.0 lb

## 2015-04-09 DIAGNOSIS — Z95810 Presence of automatic (implantable) cardiac defibrillator: Secondary | ICD-10-CM

## 2015-04-09 DIAGNOSIS — I2510 Atherosclerotic heart disease of native coronary artery without angina pectoris: Secondary | ICD-10-CM | POA: Diagnosis not present

## 2015-04-09 DIAGNOSIS — I5022 Chronic systolic (congestive) heart failure: Secondary | ICD-10-CM

## 2015-04-09 DIAGNOSIS — I255 Ischemic cardiomyopathy: Secondary | ICD-10-CM

## 2015-04-09 DIAGNOSIS — I4729 Other ventricular tachycardia: Secondary | ICD-10-CM

## 2015-04-09 DIAGNOSIS — I480 Paroxysmal atrial fibrillation: Secondary | ICD-10-CM | POA: Diagnosis not present

## 2015-04-09 DIAGNOSIS — I472 Ventricular tachycardia: Secondary | ICD-10-CM

## 2015-04-09 LAB — CUP PACEART REMOTE DEVICE CHECK
Brady Statistic AP VP Percent: 95.01 %
Brady Statistic AP VS Percent: 0 %
Brady Statistic AS VP Percent: 4.99 %
Brady Statistic RA Percent Paced: 95.01 %
Date Time Interrogation Session: 20161128072704
HIGH POWER IMPEDANCE MEASURED VALUE: 43 Ohm
HIGH POWER IMPEDANCE MEASURED VALUE: 56 Ohm
Implantable Lead Location: 753860
Implantable Lead Model: 5076
Lead Channel Impedance Value: 323 Ohm
Lead Channel Impedance Value: 437 Ohm
Lead Channel Impedance Value: 456 Ohm
Lead Channel Pacing Threshold Amplitude: 1.375 V
Lead Channel Sensing Intrinsic Amplitude: 1.75 mV
Lead Channel Setting Pacing Amplitude: 2 V
Lead Channel Setting Pacing Pulse Width: 0.4 ms
Lead Channel Setting Sensing Sensitivity: 0.3 mV
MDC IDC LEAD IMPLANT DT: 20000626
MDC IDC LEAD IMPLANT DT: 20100818
MDC IDC LEAD LOCATION: 753860
MDC IDC MSMT BATTERY REMAINING LONGEVITY: 66 mo
MDC IDC MSMT BATTERY VOLTAGE: 2.99 V
MDC IDC MSMT LEADCHNL LV IMPEDANCE VALUE: 380 Ohm
MDC IDC MSMT LEADCHNL LV IMPEDANCE VALUE: 703 Ohm
MDC IDC MSMT LEADCHNL RA IMPEDANCE VALUE: 399 Ohm
MDC IDC MSMT LEADCHNL RA SENSING INTR AMPL: 1.75 mV
MDC IDC MSMT LEADCHNL RV PACING THRESHOLD PULSEWIDTH: 0.4 ms
MDC IDC SET LEADCHNL LV PACING AMPLITUDE: 2.25 V
MDC IDC SET LEADCHNL LV PACING PULSEWIDTH: 0.4 ms
MDC IDC SET LEADCHNL RV PACING AMPLITUDE: 2.75 V
MDC IDC STAT BRADY AS VS PERCENT: 0 %
MDC IDC STAT BRADY RV PERCENT PACED: 99.95 %

## 2015-04-09 MED ORDER — FUROSEMIDE 40 MG PO TABS: 40.0000 mg | ORAL_TABLET | Freq: Two times a day (BID) | ORAL | Status: DC

## 2015-04-09 MED ORDER — SACUBITRIL-VALSARTAN 24-26 MG PO TABS: 1.0000 | ORAL_TABLET | Freq: Two times a day (BID) | ORAL | Status: DC

## 2015-04-09 MED ORDER — ATORVASTATIN CALCIUM 80 MG PO TABS: 80.0000 mg | ORAL_TABLET | Freq: Every day | ORAL | Status: DC

## 2015-04-09 MED ORDER — CARVEDILOL 12.5 MG PO TABS: 12.5000 mg | ORAL_TABLET | Freq: Two times a day (BID) | ORAL | Status: DC

## 2015-04-09 NOTE — Assessment & Plan Note (Signed)
Continue apixaban; check hemoglobin and renal function. Continue amiodarone and beta blocker. Check liver functions, TSH and Check chest x-ray.

## 2015-04-09 NOTE — Patient Instructions (Signed)
Medication Instructions:   STOP RAMIPRIL  AFTER 2 DAYS START ENTRESTO 24/26 MG ONE TABLET TWICE DAILY  Labwork:  Your physician recommends that you return for lab work in: [place]   Testing/Procedures:  A chest x-ray takes a picture of the organs and structures inside the chest, including the heart, lungs, and blood vessels. This test can show several things, including, whether the heart is enlarges; whether fluid is building up in the lungs; and whether pacemaker / defibrillator leads are still in place. AT [place]:  Your physician recommends that you schedule a follow-up appointment in: [place]   If you need a refill on your cardiac medications before your next appointment, please call your pharmacy.

## 2015-04-09 NOTE — Assessment & Plan Note (Addendum)
Continue beta blocker. Discontinue altace. 48 hours later we will begin entresto 24/26 BID; check K and renal function one week later.

## 2015-04-09 NOTE — Assessment & Plan Note (Signed)
Followed by electrophysiology. 

## 2015-04-09 NOTE — Assessment & Plan Note (Signed)
Continue statin.No aspirin given need for anticoagulation. 

## 2015-04-09 NOTE — Assessment & Plan Note (Signed)
Continue statin. Check lipids

## 2015-04-09 NOTE — Assessment & Plan Note (Signed)
Continue amiodarone. 

## 2015-04-09 NOTE — Assessment & Plan Note (Signed)
Patient euvolemic on examination.Continue present dose of diuretics. Check potassium and renal function.

## 2015-04-10 ENCOUNTER — Encounter: Payer: Self-pay | Admitting: Cardiology

## 2015-04-10 ENCOUNTER — Other Ambulatory Visit: Payer: Self-pay | Admitting: Cardiology

## 2015-04-20 ENCOUNTER — Ambulatory Visit (HOSPITAL_COMMUNITY)
Admission: RE | Admit: 2015-04-20 | Discharge: 2015-04-20 | Disposition: A | Discharge status: Home / Self Care | Payer: Medicare (Managed Care) | Source: Ambulatory Visit | Attending: Cardiology | Admitting: Cardiology

## 2015-04-20 DIAGNOSIS — I517 Cardiomegaly: Secondary | ICD-10-CM | POA: Diagnosis not present

## 2015-04-20 DIAGNOSIS — Z87891 Personal history of nicotine dependence: Secondary | ICD-10-CM | POA: Diagnosis not present

## 2015-04-20 DIAGNOSIS — I480 Paroxysmal atrial fibrillation: Secondary | ICD-10-CM

## 2015-04-21 LAB — HEPATIC FUNCTION PANEL
ALBUMIN: 3.6 g/dL (ref 3.6–5.1)
ALT: 20 U/L (ref 9–46)
AST: 21 U/L (ref 10–35)
Alkaline Phosphatase: 77 U/L (ref 40–115)
BILIRUBIN INDIRECT: 0.4 mg/dL (ref 0.2–1.2)
Bilirubin, Direct: 0.1 mg/dL (ref <=0.2)
TOTAL PROTEIN: 6.6 g/dL (ref 6.1–8.1)
Total Bilirubin: 0.5 mg/dL (ref 0.2–1.2)

## 2015-04-21 LAB — CBC
HCT: 45.5 % (ref 39.0–52.0)
Hemoglobin: 15.2 g/dL (ref 13.0–17.0)
MCH: 32.4 pg (ref 26.0–34.0)
MCHC: 33.4 g/dL (ref 30.0–36.0)
MCV: 97 fL (ref 78.0–100.0)
MPV: 10.8 fL (ref 8.6–12.4)
PLATELETS: 313 10*3/uL (ref 150–400)
RBC: 4.69 MIL/uL (ref 4.22–5.81)
RDW: 14.6 % (ref 11.5–15.5)
WBC: 10.8 10*3/uL — ABNORMAL HIGH (ref 4.0–10.5)

## 2015-04-21 LAB — BASIC METABOLIC PANEL
BUN: 14 mg/dL (ref 7–25)
CALCIUM: 9 mg/dL (ref 8.6–10.3)
CHLORIDE: 103 mmol/L (ref 98–110)
CO2: 29 mmol/L (ref 20–31)
CREATININE: 1.04 mg/dL (ref 0.70–1.33)
GLUCOSE: 132 mg/dL — AB (ref 65–99)
Potassium: 4.5 mmol/L (ref 3.5–5.3)
Sodium: 139 mmol/L (ref 135–146)

## 2015-04-21 LAB — LIPID PANEL
Cholesterol: 124 mg/dL — ABNORMAL LOW (ref 125–200)
HDL: 26 mg/dL — AB (ref >=40)
LDL CALC: 66 mg/dL (ref <130)
TRIGLYCERIDES: 162 mg/dL — AB (ref <150)
Total CHOL/HDL Ratio: 4.8 Ratio (ref <=5.0)
VLDL: 32 mg/dL — ABNORMAL HIGH (ref <30)

## 2015-04-21 LAB — TSH: TSH: 2.532 u[IU]/mL (ref 0.350–4.500)

## 2015-04-24 ENCOUNTER — Encounter: Payer: Self-pay | Admitting: *Deleted

## 2015-05-28 ENCOUNTER — Telehealth: Payer: Self-pay | Admitting: Cardiology

## 2015-05-28 NOTE — Telephone Encounter (Signed)
New message      Talk to the nurse regarding his medications.  He cannot afford them---eliquis and entresto.  Please call

## 2015-05-28 NOTE — Telephone Encounter (Signed)
Returned call to patient.He stated he cannot afford eliquis and entresto.Stated he has applied for Medicaid and has not heard if he is approved.Advised samples of eliquis 5 mg and entresto 24/26 left at [facility] office front desk.

## 2015-07-07 ENCOUNTER — Encounter: Payer: Self-pay | Admitting: Internal Medicine

## 2015-07-07 ENCOUNTER — Ambulatory Visit (INDEPENDENT_AMBULATORY_CARE_PROVIDER_SITE_OTHER): Payer: Medicare FFS | Admitting: Internal Medicine

## 2015-07-07 VITALS — BP 102/70 | HR 78 | Ht 62.0 in | Wt 173.6 lb

## 2015-07-07 DIAGNOSIS — I472 Ventricular tachycardia: Secondary | ICD-10-CM

## 2015-07-07 DIAGNOSIS — I480 Paroxysmal atrial fibrillation: Secondary | ICD-10-CM | POA: Diagnosis not present

## 2015-07-07 DIAGNOSIS — Z95810 Presence of automatic (implantable) cardiac defibrillator: Secondary | ICD-10-CM | POA: Diagnosis not present

## 2015-07-07 DIAGNOSIS — I255 Ischemic cardiomyopathy: Secondary | ICD-10-CM | POA: Diagnosis not present

## 2015-07-07 DIAGNOSIS — I5022 Chronic systolic (congestive) heart failure: Secondary | ICD-10-CM

## 2015-07-07 DIAGNOSIS — I4729 Other ventricular tachycardia: Secondary | ICD-10-CM

## 2015-07-07 NOTE — Progress Notes (Signed)
Patient Care Team: No Pcp Per Patient as PCP - General (General Practice)   HPI  Michael Krueger is a 60 y.o. male Seen in followup for VT with ICD-CRT implantation   for congestive heart failure in the setting of ischemic heart cardiomyopathy   He underwent later generator replacement 10/23/13 with a support of Aegis pouch.   He has a history of ventricular tachycardia status post ablation. He also has a history of atrial fibrillation and he takes amiodarone for the combination of the 2. .  Last cath performed in July of 2010 and revealed an occluded RCA but nonobstructive disease in the Lcx and LAD other than ostial 80 in a small marginal; EF 10-15.  Echocardiogram 6/15 demonstrated EF of 15%.  Amiodarone surveillance laboratories were normal 12/16 ; last digoxin level is measured one year ago and his level was reduced with repeat check of 0.9  2/16    The patient denies chest pain, orthopnea or peripheral edema. He has had some problems recently with PND. He has no edmea   There have been no palpitations, lightheadedness or syncope.     Past Medical History  Diagnosis Date  . HYPERTHYROIDISM   . HYPERLIPIDEMIA-MIXED   . OBESITY-MORBID (>100')   . CAD   . AV BLOCK, COMPLETE   . VENTRICULAR TACHYCARDIA     S/P RFCA x3  2006, 2010,2010  . Atrial fibrillation ([place])   . Chronic systolic heart failure ([place])   . TRANSAMINASES, SERUM, ELEVATED   . Long term current use of anticoagulant   . Ischemic cardiomyopathy   . Implantable cardiac defibrillator  medtronic     Initially related 1990s, upgrade to dual-chamber 2002, generator change 2005, generator change August 2010, hematoma evacuation September 2010.  [name] Gynecomastia     Past Surgical History  Procedure Laterality Date  . Splenectomy    . Status post medtronic concerto crt-d in august 2010 with pocket revision    . Implantable cardioverter defibrillator generator change N/A 10/23/2013    Procedure:  IMPLANTABLE CARDIOVERTER DEFIBRILLATOR GENERATOR CHANGE;  Surgeon: Deboraha Sprang, MD;  Location: [facility] CATH LAB;  Service: Cardiovascular;  Laterality: N/A;    Current Outpatient Prescriptions  Medication Sig Dispense Refill  . acetaminophen (TYLENOL) 500 MG tablet Take 1,000 mg by mouth daily as needed for headache.     [name] amiodarone (PACERONE) 200 MG tablet Take 1 tablet by mouth every morning and 1/2 tablet in the evening 135 tablet 3  . apixaban (ELIQUIS) 5 MG TABS tablet Take 5 mg by mouth 2 (two) times daily.    [name] atorvastatin (LIPITOR) 80 MG tablet Take 1 tablet (80 mg total) by mouth daily. 90 tablet 3  . carvedilol (COREG) 12.5 MG tablet Take 1 tablet (12.5 mg total) by mouth 2 (two) times daily. 180 tablet 3  . digoxin (LANOXIN) 0.125 MG tablet Take 0.0625 mg by mouth daily.    . fluocinolone (VANOS) 0.01 % cream Apply 1 application topically daily as needed.  2  . furosemide (LASIX) 40 MG tablet Take 1 tablet (40 mg total) by mouth 2 (two) times daily. 180 tablet 3  . ketoconazole (NIZORAL) 2 % shampoo Apply 1 application topically 2 (two) times a week.     . metFORMIN (GLUCOPHAGE) 850 MG tablet Take 1 tablet by mouth daily.    . pantoprazole (PROTONIX) 40 MG tablet Take 1 tablet by mouth Daily.    . sacubitril-valsartan (ENTRESTO) 24-26 MG Take 1 tablet  by mouth 2 (two) times daily. 60 tablet 6  . traZODone (DESYREL) 50 MG tablet Take 25-100 mg by mouth daily as needed for sleep.     [name] triamcinolone cream (KENALOG) 0.1 % Apply 1 application topically as needed.     . VENTOLIN HFA 108 (90 BASE) MCG/ACT inhaler 2 (two) times daily.     No current facility-administered medications for this visit.    Allergies  Allergen Reactions  . Other Itching and Other (See Comments)    CHG wipes/  Caused a rash    Review of Systems negative except from HPI and PMH  Physical Exam BP 102/70 mmHg  Pulse 78  Ht '5\' 2"'$  (1.575 m)  Wt 173 lb 9.6 oz (78.744 kg)  BMI 31.74 kg/m2 Well developed  and well nourished in no acute distress HENT normal E scleral and icterus clear Neck Supple JVP flat; carotids brisk and full Clear to ausculation  Swelling over his left breast, notably chronic  Device pocket well healed; without hematoma or erythema.  There is no tethering  Regular rate and rhythm, no murmurs gallops or rub Soft with active bowel sounds No clubbing cyanosis  Tr Edema Alert and oriented, grossly normal motor and sensory function Skin Warm and Dry  ECG demonstrates AV pacing with an up right QRS in lead V1 and negative QRS in lead 1  Assessment and  Plan  Congestive heart failure-chronic-systolic  Ventricular tachycardia  Implantable defibrillator  Ischemic cardiomyopathy  There has been no interval ventricular tachycardia.   amio labs normal 12/16  Euvolemic continue current meds  Needs Dig level   .

## 2015-07-07 NOTE — Patient Instructions (Signed)
Medication Instructions: No change  Labwork:Your physician recommends that you have lab work today: digoxin level   Procedures/Testing: None ordered   Follow-Up: Your physician wants you to follow-up in:6 months with Chanetta Amiri, NP. You will receive a reminder letter in the mail two months in advance. If you don't receive a letter, please call our office to schedule the follow-up appointment.  Any Additional Special Instructions Will Be Listed Below (If Applicable).     If you need a refill on your cardiac medications before your next appointment, please call your pharmacy.

## 2015-07-08 LAB — DIGOXIN LEVEL: DIGOXIN LVL: 0.5 ug/L — AB (ref 0.8–2.0)

## 2015-07-10 NOTE — Progress Notes (Signed)
HPI: FU CAD, ischemic cardiomyopathy, systolic HF, status post BiV-ICD, V. tach status post ablation, atrial fibrillation on amiodarone.   Since last seen, the patient has dyspnea with more extreme activities but not with routine activities. It is relieved with rest. It is not associated with chest pain. There is no orthopnea, PND or pedal edema. There is no syncope or palpitations. There is no exertional chest pain.    Studies/Reports Reviewed Today:  Echo 09/2013 - EF 15%. There is akinesis of the anteroseptal, inferior, and apical myocardium. Grade 1 diastolic dysfunction.  - Mitral valve: Calcified annulus. There was mild regurgitation. - Left atrium: The atrium was mildly dilated. Impressions: Anteroseptal and apical akinesis; overall severely reduced LVfunction; using contrast no apical thrombus noted.  Myoview 08/2011 Large area of scar in the anterior, anteroseptal, inferoseptal, inferior, anterolateral and apical distributions.  No signif ischemia. LV Ejection Fraction: 14%. LV Wall Motion: Severe diffuse hypokinesis, inferior, apical akinesis.  Cardiac catheterization 10/2008 LM: Okay LAD: Stent patent with 30% ISR, mid and distal 30% LCx: Tiny marginal branch 80% proximal, proximal to this stent patent with 30% ISR, mid CFX 40% RCA: 70% after RV branch then occluded (bridging collaterals) EF 10-15%  Current Outpatient Prescriptions  Medication Sig Dispense Refill  . acetaminophen (TYLENOL) 500 MG tablet Take 1,000 mg by mouth daily as needed for headache.     [name] amiodarone (PACERONE) 200 MG tablet Take 1 tablet by mouth every morning and 1/2 tablet in the evening (Patient taking differently: Take 200 mg by mouth daily. ) 135 tablet 3  . apixaban (ELIQUIS) 5 MG TABS tablet Take 5 mg by mouth 2 (two) times daily.    [name] atorvastatin (LIPITOR) 80 MG tablet Take 1 tablet (80 mg total) by mouth daily. 90 tablet 3  . carvedilol (COREG) 12.5 MG tablet Take 1 tablet  (12.5 mg total) by mouth 2 (two) times daily. 180 tablet 3  . digoxin (LANOXIN) 0.125 MG tablet Take 0.0625 mg by mouth daily.    . fluocinolone (VANOS) 0.01 % cream Apply 1 application topically daily as needed.  2  . furosemide (LASIX) 40 MG tablet Take 1 tablet (40 mg total) by mouth 2 (two) times daily. 180 tablet 3  . ketoconazole (NIZORAL) 2 % shampoo Apply 1 application topically 2 (two) times a week.     . metFORMIN (GLUCOPHAGE) 850 MG tablet Take 1 tablet by mouth daily.    . pantoprazole (PROTONIX) 40 MG tablet Take 1 tablet by mouth Daily.    . sacubitril-valsartan (ENTRESTO) 24-26 MG Take 1 tablet by mouth 2 (two) times daily. 60 tablet 6  . traZODone (DESYREL) 50 MG tablet Take 25-100 mg by mouth daily as needed for sleep.     [name] triamcinolone cream (KENALOG) 0.1 % Apply 1 application topically as needed.     . VENTOLIN HFA 108 (90 BASE) MCG/ACT inhaler 2 (two) times daily.     No current facility-administered medications for this visit.     Past Medical History  Diagnosis Date  . HYPERTHYROIDISM   . HYPERLIPIDEMIA-MIXED   . OBESITY-MORBID (>100')   . CAD   . AV BLOCK, COMPLETE   . VENTRICULAR TACHYCARDIA     S/P RFCA x3  2006, 2010,2010  . Atrial fibrillation ([place])   . Chronic systolic heart failure ([place])   . TRANSAMINASES, SERUM, ELEVATED   . Long term current use of anticoagulant   . Ischemic cardiomyopathy   . Implantable cardiac defibrillator  medtronic     Initially related 1990s, upgrade to dual-chamber 2002, generator change 2005, generator change August 2010, hematoma evacuation September 2010.  [name] Gynecomastia     Past Surgical History  Procedure Laterality Date  . Splenectomy    . Status post medtronic concerto crt-d in august 2010 with pocket revision    . Implantable cardioverter defibrillator generator change N/A 10/23/2013    Procedure: IMPLANTABLE CARDIOVERTER DEFIBRILLATOR GENERATOR CHANGE;  Surgeon: Deboraha Sprang, MD;  Location: [facility] CATH LAB;   Service: Cardiovascular;  Laterality: N/A;    Social History   Social History  . Marital Status: Single    Spouse Name: N/A  . Number of Children: N/A  . Years of Education: N/A   Occupational History  . Not on file.   Social History Main Topics  . Smoking status: Former Research scientist (life sciences)  . Smokeless tobacco: Never Used  . Alcohol Use: Yes  . Drug Use: No  . Sexual Activity: Not on file   Other Topics Concern  . Not on file   Social History Narrative    Family History  Problem Relation Age of Onset  . Heart attack Maternal Grandfather   . Stroke Paternal Grandfather   . Hypertension Sister   . Cancer Mother     ROS: no fevers or chills, productive cough, hemoptysis, dysphasia, odynophagia, melena, hematochezia, dysuria, hematuria, rash, seizure activity, orthopnea, PND, pedal edema, claudication. Remaining systems are negative.  Physical Exam: Well-developed well-nourished in no acute distress.  Skin is warm and dry.  HEENT is normal.  Neck is supple.  Chest is clear to auscultation with normal expansion. ICD left chest Cardiovascular exam is regular rate and rhythm.  Abdominal exam nontender or distended. No masses palpated. Incisional hernia noted Extremities show no edema. neuro grossly intact

## 2015-07-16 ENCOUNTER — Encounter: Payer: Self-pay | Admitting: Cardiology

## 2015-07-16 ENCOUNTER — Ambulatory Visit (INDEPENDENT_AMBULATORY_CARE_PROVIDER_SITE_OTHER): Payer: Medicare FFS | Admitting: Cardiology

## 2015-07-16 VITALS — BP 94/66 | HR 78 | Ht 63.0 in | Wt 172.0 lb

## 2015-07-16 DIAGNOSIS — I480 Paroxysmal atrial fibrillation: Secondary | ICD-10-CM | POA: Diagnosis not present

## 2015-07-16 DIAGNOSIS — I2510 Atherosclerotic heart disease of native coronary artery without angina pectoris: Secondary | ICD-10-CM | POA: Diagnosis not present

## 2015-07-16 DIAGNOSIS — Z95810 Presence of automatic (implantable) cardiac defibrillator: Secondary | ICD-10-CM

## 2015-07-16 DIAGNOSIS — I4729 Other ventricular tachycardia: Secondary | ICD-10-CM

## 2015-07-16 DIAGNOSIS — I255 Ischemic cardiomyopathy: Secondary | ICD-10-CM

## 2015-07-16 DIAGNOSIS — I5022 Chronic systolic (congestive) heart failure: Secondary | ICD-10-CM

## 2015-07-16 DIAGNOSIS — I472 Ventricular tachycardia: Secondary | ICD-10-CM

## 2015-07-16 NOTE — Assessment & Plan Note (Signed)
Followed by electrophysiology. 

## 2015-07-16 NOTE — Assessment & Plan Note (Signed)
Continue entresto and coreg.

## 2015-07-16 NOTE — Assessment & Plan Note (Signed)
Continue amiodarone, apixaban, and Coreg.

## 2015-07-16 NOTE — Assessment & Plan Note (Signed)
Continue statin.No aspirin given need for anticoagulation. 

## 2015-07-16 NOTE — Assessment & Plan Note (Signed)
Euvolemic on examination. Continue present dose of diuretics.

## 2015-07-16 NOTE — Assessment & Plan Note (Signed)
Continue amiodarone. 

## 2015-07-16 NOTE — Assessment & Plan Note (Signed)
Continue statin. 

## 2015-07-16 NOTE — Patient Instructions (Signed)
Your physician wants you to follow-up in: 6 MONTHS WITH DR CRENSHAW You will receive a reminder letter in the mail two months in advance. If you don't receive a letter, please call our office to schedule the follow-up appointment.   If you need a refill on your cardiac medications before your next appointment, please call your pharmacy.  

## 2015-08-03 ENCOUNTER — Telehealth: Payer: Self-pay

## 2015-08-03 NOTE — Telephone Encounter (Signed)
Request for Tiering exception of Entresto 24-26 sent to [facility].

## 2015-08-04 ENCOUNTER — Telehealth: Payer: Self-pay

## 2015-08-04 NOTE — Telephone Encounter (Signed)
Entresto 24-26 approved by [facility], however the Tier exception was denied.

## 2015-08-14 ENCOUNTER — Telehealth: Payer: Self-pay | Admitting: Cardiology

## 2015-08-14 NOTE — Telephone Encounter (Signed)
New message     [employer] does not want to pay for his insurance - Entresto  24-26 mg

## 2015-08-14 NOTE — Telephone Encounter (Signed)
Message left on vm of patient to have the pharmacy contact us directly with the problem of insurance not covering entresto to see if prior Josem Kaufmann is needed. This is not a new prescription for patient.

## 2015-08-21 ENCOUNTER — Telehealth: Payer: Self-pay | Admitting: Cardiology

## 2015-08-21 NOTE — Telephone Encounter (Signed)
Pt called in stating that his insurance company is not going to cover his Delene Loll so he would like to know what to do. Please f/u with pt  Thanks

## 2015-08-21 NOTE — Telephone Encounter (Signed)
Unable to reach pt or leave a message  Will give the pt the number to [facility] central @ [phone].

## 2015-08-24 NOTE — Telephone Encounter (Signed)
Per pt call he as questions about his medication appeal.

## 2015-08-25 NOTE — Telephone Encounter (Signed)
Attempted to call patient. No voicemail set up.

## 2015-08-27 NOTE — Telephone Encounter (Signed)
Left message for patient to call back  

## 2015-08-31 ENCOUNTER — Other Ambulatory Visit: Payer: Self-pay | Admitting: Cardiology

## 2015-08-31 NOTE — Telephone Encounter (Signed)
Spoke with patient and explained that I have a fax from [place] his Michael Krueger. Also have a fax where they explained why they denied a Tier Exception. So none of this makes sense to either of [place]. He is going to call [employer] to see what he can find out.

## 2015-09-14 ENCOUNTER — Other Ambulatory Visit: Payer: Self-pay | Admitting: Cardiology

## 2015-09-14 NOTE — Telephone Encounter (Signed)
Rx request sent to pharmacy.  

## 2015-10-06 ENCOUNTER — Ambulatory Visit (INDEPENDENT_AMBULATORY_CARE_PROVIDER_SITE_OTHER): Payer: Medicare FFS | Admitting: *Deleted

## 2015-10-06 DIAGNOSIS — I472 Ventricular tachycardia: Secondary | ICD-10-CM | POA: Diagnosis not present

## 2015-10-06 DIAGNOSIS — Z95810 Presence of automatic (implantable) cardiac defibrillator: Secondary | ICD-10-CM | POA: Diagnosis not present

## 2015-10-06 DIAGNOSIS — I4729 Other ventricular tachycardia: Secondary | ICD-10-CM

## 2015-10-06 NOTE — Progress Notes (Signed)
Remote ICD transmission.   

## 2015-10-15 LAB — CUP PACEART REMOTE DEVICE CHECK
Battery Voltage: 2.97 V
Brady Statistic RA Percent Paced: 89.54 %
Date Time Interrogation Session: 20170606073423
HIGH POWER IMPEDANCE MEASURED VALUE: 43 Ohm
HIGH POWER IMPEDANCE MEASURED VALUE: 53 Ohm
Implantable Lead Implant Date: 20000626
Implantable Lead Location: 753860
Implantable Lead Location: 753860
Implantable Lead Model: 6945
Lead Channel Impedance Value: 304 Ohm
Lead Channel Impedance Value: 399 Ohm
Lead Channel Pacing Threshold Amplitude: 1.25 V
Lead Channel Sensing Intrinsic Amplitude: 1.625 mV
Lead Channel Sensing Intrinsic Amplitude: 1.625 mV
Lead Channel Setting Pacing Amplitude: 2.5 V
Lead Channel Setting Pacing Pulse Width: 0.4 ms
Lead Channel Setting Pacing Pulse Width: 0.4 ms
MDC IDC LEAD IMPLANT DT: 20100818
MDC IDC MSMT BATTERY REMAINING LONGEVITY: 57 mo
MDC IDC MSMT LEADCHNL LV IMPEDANCE VALUE: 399 Ohm
MDC IDC MSMT LEADCHNL LV IMPEDANCE VALUE: 703 Ohm
MDC IDC MSMT LEADCHNL RA IMPEDANCE VALUE: 380 Ohm
MDC IDC MSMT LEADCHNL RV IMPEDANCE VALUE: 456 Ohm
MDC IDC MSMT LEADCHNL RV PACING THRESHOLD PULSEWIDTH: 0.4 ms
MDC IDC SET LEADCHNL RA PACING AMPLITUDE: 2 V
MDC IDC SET LEADCHNL RV PACING AMPLITUDE: 2.75 V
MDC IDC SET LEADCHNL RV SENSING SENSITIVITY: 0.3 mV
MDC IDC STAT BRADY AP VP PERCENT: 89.53 %
MDC IDC STAT BRADY AP VS PERCENT: 0.01 %
MDC IDC STAT BRADY AS VP PERCENT: 10.44 %
MDC IDC STAT BRADY AS VS PERCENT: 0.02 %
MDC IDC STAT BRADY RV PERCENT PACED: 99.88 %

## 2015-10-21 ENCOUNTER — Encounter: Payer: Self-pay | Admitting: Cardiology

## 2015-12-17 ENCOUNTER — Other Ambulatory Visit: Payer: Self-pay | Admitting: *Deleted

## 2015-12-21 ENCOUNTER — Other Ambulatory Visit: Payer: Self-pay | Admitting: *Deleted

## 2015-12-21 DIAGNOSIS — I255 Ischemic cardiomyopathy: Secondary | ICD-10-CM

## 2015-12-21 MED ORDER — SACUBITRIL-VALSARTAN 24-26 MG PO TABS: 1.0000 | ORAL_TABLET | Freq: Two times a day (BID) | ORAL | 1 refills | Status: DC

## 2016-01-07 ENCOUNTER — Other Ambulatory Visit: Payer: Self-pay | Admitting: Cardiology

## 2016-01-11 NOTE — Progress Notes (Deleted)
Electrophysiology Office Note Date: 01/11/2016  ID:  Michael Krueger, DOB August 22, 1955, MRN [id number]  PCP: Pcp Not In System Primary Cardiologist: *** Electrophysiologist: ***  CC: Routine ICD follow-up  Michael Krueger is a 60 y.o. male seen today for ***.  {He/she (caps):30048} presents today for routine electrophysiology followup.  Since last being seen in our clinic, the patient reports doing very well. {He/she (caps):30048} denies chest pain, palpitations, dyspnea, PND, orthopnea, nausea, vomiting, dizziness, syncope, edema, weight gain, or early satiety.  {He/she (caps):30048} has not had ICD shocks.   Device History: ICD implanted *** for *** History of appropriate therapy: {yes/no:20286} History of AAD therapy: {yes/no:20286}   Past Medical History:  Diagnosis Date  . Atrial fibrillation ([place])   . AV BLOCK, COMPLETE   . CAD   . Chronic systolic heart failure ([place])   . Gynecomastia   . HYPERLIPIDEMIA-MIXED   . HYPERTHYROIDISM   . Implantable cardiac defibrillator  medtronic    Initially related 1990s, upgrade to dual-chamber 2002, generator change 2005, generator change August 2010, hematoma evacuation September 2010.  . Ischemic cardiomyopathy   . Long term current use of anticoagulant   . OBESITY-MORBID (>100')   . TRANSAMINASES, SERUM, ELEVATED   . VENTRICULAR TACHYCARDIA    S/P RFCA x3  2006, 2010,2010   Past Surgical History:  Procedure Laterality Date  . IMPLANTABLE CARDIOVERTER DEFIBRILLATOR GENERATOR CHANGE N/A 10/23/2013   Procedure: IMPLANTABLE CARDIOVERTER DEFIBRILLATOR GENERATOR CHANGE;  Surgeon: Deboraha Sprang, MD;  Location: [facility] CATH LAB;  Service: Cardiovascular;  Laterality: N/A;  . SPLENECTOMY    . Status post Medtronic Concerto CRT-D in August 2010 with pocket revision      Current Outpatient Prescriptions  Medication Sig Dispense Refill  . acetaminophen (TYLENOL) 500 MG tablet Take 1,000 mg by mouth daily as needed for headache.     [name]  amiodarone (PACERONE) 200 MG tablet Take 1 tablet by mouth every morning and 1/2 tablet in the evening (Patient taking differently: Take 200 mg by mouth daily. ) 135 tablet 3  . atorvastatin (LIPITOR) 80 MG tablet Take 1 tablet (80 mg total) by mouth daily. 90 tablet 3  . carvedilol (COREG) 12.5 MG tablet Take 1 tablet (12.5 mg total) by mouth 2 (two) times daily. 180 tablet 3  . digoxin (LANOXIN) 0.125 MG tablet TAKE 1/2 TABLET BY MOUTH ONCE DAILY 45 tablet 1  . ELIQUIS 5 MG TABS tablet TAKE 1 TABLET BY MOUTH TWICE A DAY 60 tablet 5  . fluocinolone (VANOS) 0.01 % cream Apply 1 application topically daily as needed.  2  . furosemide (LASIX) 40 MG tablet Take 1 tablet (40 mg total) by mouth 2 (two) times daily. 180 tablet 3  . ketoconazole (NIZORAL) 2 % shampoo Apply 1 application topically 2 (two) times a week.     . metFORMIN (GLUCOPHAGE) 850 MG tablet Take 1 tablet by mouth daily.    . pantoprazole (PROTONIX) 40 MG tablet Take 1 tablet by mouth Daily.    . sacubitril-valsartan (ENTRESTO) 24-26 MG Take 1 tablet by mouth 2 (two) times daily. 180 tablet 1  . traZODone (DESYREL) 50 MG tablet Take 25-100 mg by mouth daily as needed for sleep.     [name] triamcinolone cream (KENALOG) 0.1 % Apply 1 application topically as needed.     . VENTOLIN HFA 108 (90 BASE) MCG/ACT inhaler 2 (two) times daily.     No current facility-administered medications for this visit.     Allergies:  Other   Social History: Social History   Social History  . Marital status: Single    Spouse name: N/A  . Number of children: N/A  . Years of education: N/A   Occupational History  . Not on file.   Social History Main Topics  . Smoking status: Former Research scientist (life sciences)  . Smokeless tobacco: Never Used  . Alcohol use Yes  . Drug use: No  . Sexual activity: Not on file   Other Topics Concern  . Not on file   Social History Narrative  . No narrative on file    Family History: Family History  Problem Relation Age of  Onset  . Cancer Mother   . Heart attack Maternal Grandfather   . Stroke Paternal Grandfather   . Hypertension Sister     Review of Systems: General: No chills, fever, night sweats or weight changes  Cardiovascular:  No chest pain, dyspnea on exertion, edema, orthopnea, palpitations, paroxysmal nocturnal dyspnea; denies ICD shocks Dermatological: No rash, lesions or masses Respiratory: No cough, dyspnea Urologic: No hematuria, dysuria Abdominal: No nausea, vomiting, diarrhea, bright red blood per rectum, melena, or hematemesis Neurologic: No visual changes, weakness, changes in mental status All other systems reviewed and are otherwise negative except as noted above.   Physical Exam: VS:  There were no vitals taken for this visit. , BMI There is no height or weight on file to calculate BMI.  GEN- The patient is well appearing, alert and oriented x 3 today.   HEENT: normocephalic, atraumatic; sclera clear, conjunctiva pink; hearing intact; oropharynx clear; neck supple, no JVP Lymph- no cervical lymphadenopathy Lungs- Clear to ausculation bilaterally, normal work of breathing.  No wheezes, rales, rhonchi Heart- Regular rate and rhythm, no murmurs, rubs or gallops, PMI not laterally displaced GI- soft, non-tender, non-distended, bowel sounds present, no hepatosplenomegaly Extremities- no clubbing, cyanosis, or edema; DP/PT/radial pulses 2+ bilaterally MS- no significant deformity or atrophy Skin- warm and dry, no rash or lesion; ICD pocket well healed Psych- euthymic mood, full affect Neuro- strength and sensation are intact  ICD interrogation- reviewed in detail today,  See PACEART report  EKG:  EKG {ACTION; IS/IS LKT:[id number]} ordered today. The ekg ordered today shows ***  Recent Labs: 04/20/2015: ALT 20; BUN 14; Creat 1.04; Hemoglobin 15.2; Platelets 313; Potassium 4.5; Sodium 139; TSH 2.532   Wt Readings from Last 3 Encounters:  07/16/15 172 lb (78 kg)  07/07/15 173 lb  9.6 oz (78.7 kg)  04/09/15 167 lb (75.8 kg)     Other studies Reviewed: Additional studies/ records that were reviewed today include: ***  Review of the above records today demonstrates: ***  Assessment and Plan:  1.  Chronic systolic dysfunction euvolemic today Stable on an appropriate medical regimen Normal ICD function See Pace Art report No changes today   Current medicines are reviewed at length with the patient today.   The patient {ACTIONS; HAS/DOES NOT HAVE:19233} concerns regarding his medicines.  The following changes were made today:  {NONE DEFAULTED:18576::"none"}  Labs/ tests ordered today include: *** No orders of the defined types were placed in this encounter.    Disposition:   Follow up with *** {gen number [id number]:[id number]} {TIME; UNITS DAY/WEEK/MONTH:19136}   Signed, Chanetta Paxten, NP 01/11/2016 11:26 AM  [facility] [address] [place] [address] [phone] (office) [phone] (fax

## 2016-01-13 ENCOUNTER — Encounter: Payer: Medicare FFS | Admitting: Nurse Practitioner

## 2016-01-20 ENCOUNTER — Encounter: Payer: Self-pay | Admitting: Cardiology

## 2016-01-21 ENCOUNTER — Other Ambulatory Visit: Payer: Self-pay | Admitting: Cardiology

## 2016-01-21 NOTE — Telephone Encounter (Signed)
Rx(s) sent to pharmacy electronically.  

## 2016-01-29 NOTE — Progress Notes (Deleted)
HPI: FU CAD, ischemic cardiomyopathy, systolic HF, status post BiV-ICD, V. tach status post ablation, atrial fibrillation on amiodarone.   Since last seen,     Studies/Reports Reviewed Today:  Echo 09/2013 - EF 15%. There is akinesis of the anteroseptal, inferior, and apical myocardium. Grade 1 diastolic dysfunction.  - Mitral valve: Calcified annulus. There was mild regurgitation. - Left atrium: The atrium was mildly dilated. Impressions: Anteroseptal and apical akinesis; overall severely reduced LVfunction; using contrast no apical thrombus noted.  Myoview 08/2011 Large area of scar in the anterior, anteroseptal, inferoseptal, inferior, anterolateral and apical distributions.  No signif ischemia. LV Ejection Fraction: 14%. LV Wall Motion: Severe diffuse hypokinesis, inferior, apical akinesis.  Cardiac catheterization 10/2008 LM: Okay LAD: Stent patent with 30% ISR, mid and distal 30% LCx: Tiny marginal branch 80% proximal, proximal to this stent patent with 30% ISR, mid CFX 40% RCA: 70% after RV branch then occluded (bridging collaterals) EF 10-15%  Current Outpatient Prescriptions  Medication Sig Dispense Refill  . acetaminophen (TYLENOL) 500 MG tablet Take 1,000 mg by mouth daily as needed for headache.     [name] amiodarone (PACERONE) 200 MG tablet TAKE 1 TABLET BY MOUTH EVERY MORNING AND 1/2 TABLET IN THE EVENING 135 tablet 2  . atorvastatin (LIPITOR) 80 MG tablet Take 1 tablet (80 mg total) by mouth daily. 90 tablet 3  . carvedilol (COREG) 12.5 MG tablet Take 1 tablet (12.5 mg total) by mouth 2 (two) times daily. 180 tablet 3  . digoxin (LANOXIN) 0.125 MG tablet TAKE 1/2 TABLET BY MOUTH ONCE DAILY 45 tablet 1  . ELIQUIS 5 MG TABS tablet TAKE 1 TABLET BY MOUTH TWICE A DAY 60 tablet 5  . fluocinolone (VANOS) 0.01 % cream Apply 1 application topically daily as needed.  2  . furosemide (LASIX) 40 MG tablet Take 1 tablet (40 mg total) by mouth 2 (two) times  daily. 180 tablet 3  . ketoconazole (NIZORAL) 2 % shampoo Apply 1 application topically 2 (two) times a week.     . metFORMIN (GLUCOPHAGE) 850 MG tablet Take 1 tablet by mouth daily.    . pantoprazole (PROTONIX) 40 MG tablet Take 1 tablet by mouth Daily.    . sacubitril-valsartan (ENTRESTO) 24-26 MG Take 1 tablet by mouth 2 (two) times daily. 180 tablet 1  . traZODone (DESYREL) 50 MG tablet Take 25-100 mg by mouth daily as needed for sleep.     [name] triamcinolone cream (KENALOG) 0.1 % Apply 1 application topically as needed.     . VENTOLIN HFA 108 (90 BASE) MCG/ACT inhaler 2 (two) times daily.     No current facility-administered medications for this visit.      Past Medical History:  Diagnosis Date  . Atrial fibrillation ([place])   . AV BLOCK, COMPLETE   . CAD   . Chronic systolic heart failure ([place])   . Gynecomastia   . HYPERLIPIDEMIA-MIXED   . HYPERTHYROIDISM   . Implantable cardiac defibrillator  medtronic    Initially related 1990s, upgrade to dual-chamber 2002, generator change 2005, generator change August 2010, hematoma evacuation September 2010.  . Ischemic cardiomyopathy   . Long term current use of anticoagulant   . OBESITY-MORBID (>100')   . TRANSAMINASES, SERUM, ELEVATED   . VENTRICULAR TACHYCARDIA    S/P RFCA x3  2006, 2010,2010    Past Surgical History:  Procedure Laterality Date  . IMPLANTABLE CARDIOVERTER DEFIBRILLATOR GENERATOR CHANGE N/A 10/23/2013   Procedure: IMPLANTABLE CARDIOVERTER DEFIBRILLATOR GENERATOR CHANGE;  Surgeon: Deboraha Sprang, MD;  Location: [facility] CATH LAB;  Service: Cardiovascular;  Laterality: N/A;  . SPLENECTOMY    . Status post Medtronic Concerto CRT-D in August 2010 with pocket revision      Social History   Social History  . Marital status: Single    Spouse name: N/A  . Number of children: N/A  . Years of education: N/A   Occupational History  . Not on file.   Social History Main Topics  . Smoking status: Former Research scientist (life sciences)  . Smokeless  tobacco: Never Used  . Alcohol use Yes  . Drug use: No  . Sexual activity: Not on file   Other Topics Concern  . Not on file   Social History Narrative  . No narrative on file    Family History  Problem Relation Age of Onset  . Cancer Mother   . Heart attack Maternal Grandfather   . Stroke Paternal Grandfather   . Hypertension Sister     ROS: no fevers or chills, productive cough, hemoptysis, dysphasia, odynophagia, melena, hematochezia, dysuria, hematuria, rash, seizure activity, orthopnea, PND, pedal edema, claudication. Remaining systems are negative.  Physical Exam: Well-developed well-nourished in no acute distress.  Skin is warm and dry.  HEENT is normal.  Neck is supple.  Chest is clear to auscultation with normal expansion.  Cardiovascular exam is regular rate and rhythm.  Abdominal exam nontender or distended. No masses palpated. Extremities show no edema. neuro grossly intact  ECG

## 2016-02-03 ENCOUNTER — Ambulatory Visit: Payer: Medicare FFS | Admitting: Cardiology

## 2016-02-14 NOTE — Progress Notes (Deleted)
HPI: FU CAD, ischemic cardiomyopathy, systolic HF, status post BiV-ICD, V. tach status post ablation, atrial fibrillation on amiodarone.   Since last seen,     Studies/Reports Reviewed Today:  Echo 09/2013 - EF 15%. There is akinesis of the anteroseptal, inferior, and apical myocardium. Grade 1 diastolic dysfunction.  - Mitral valve: Calcified annulus. There was mild regurgitation. - Left atrium: The atrium was mildly dilated. Impressions: Anteroseptal and apical akinesis; overall severely reduced LVfunction; using contrast no apical thrombus noted.  Myoview 08/2011 Large area of scar in the anterior, anteroseptal, inferoseptal, inferior, anterolateral and apical distributions.  No signif ischemia. LV Ejection Fraction: 14%. LV Wall Motion: Severe diffuse hypokinesis, inferior, apical akinesis.  Cardiac catheterization 10/2008 LM: Okay LAD: Stent patent with 30% ISR, mid and distal 30% LCx: Tiny marginal branch 80% proximal, proximal to this stent patent with 30% ISR, mid CFX 40% RCA: 70% after RV branch then occluded (bridging collaterals) EF 10-15%  Current Outpatient Prescriptions  Medication Sig Dispense Refill  . acetaminophen (TYLENOL) 500 MG tablet Take 1,000 mg by mouth daily as needed for headache.     [name] amiodarone (PACERONE) 200 MG tablet TAKE 1 TABLET BY MOUTH EVERY MORNING AND 1/2 TABLET IN THE EVENING 135 tablet 2  . atorvastatin (LIPITOR) 80 MG tablet Take 1 tablet (80 mg total) by mouth daily. 90 tablet 3  . carvedilol (COREG) 12.5 MG tablet Take 1 tablet (12.5 mg total) by mouth 2 (two) times daily. 180 tablet 3  . digoxin (LANOXIN) 0.125 MG tablet TAKE 1/2 TABLET BY MOUTH ONCE DAILY 45 tablet 1  . ELIQUIS 5 MG TABS tablet TAKE 1 TABLET BY MOUTH TWICE A DAY 60 tablet 5  . fluocinolone (VANOS) 0.01 % cream Apply 1 application topically daily as needed.  2  . furosemide (LASIX) 40 MG tablet Take 1 tablet (40 mg total) by mouth 2 (two) times  daily. 180 tablet 3  . ketoconazole (NIZORAL) 2 % shampoo Apply 1 application topically 2 (two) times a week.     . metFORMIN (GLUCOPHAGE) 850 MG tablet Take 1 tablet by mouth daily.    . pantoprazole (PROTONIX) 40 MG tablet Take 1 tablet by mouth Daily.    . sacubitril-valsartan (ENTRESTO) 24-26 MG Take 1 tablet by mouth 2 (two) times daily. 180 tablet 1  . traZODone (DESYREL) 50 MG tablet Take 25-100 mg by mouth daily as needed for sleep.     [name] triamcinolone cream (KENALOG) 0.1 % Apply 1 application topically as needed.     . VENTOLIN HFA 108 (90 BASE) MCG/ACT inhaler 2 (two) times daily.     No current facility-administered medications for this visit.      Past Medical History:  Diagnosis Date  . Atrial fibrillation ([place])   . AV BLOCK, COMPLETE   . CAD   . Chronic systolic heart failure ([place])   . Gynecomastia   . HYPERLIPIDEMIA-MIXED   . HYPERTHYROIDISM   . Implantable cardiac defibrillator  medtronic    Initially related 1990s, upgrade to dual-chamber 2002, generator change 2005, generator change August 2010, hematoma evacuation September 2010.  . Ischemic cardiomyopathy   . Long term current use of anticoagulant   . OBESITY-MORBID (>100')   . TRANSAMINASES, SERUM, ELEVATED   . VENTRICULAR TACHYCARDIA    S/P RFCA x3  2006, 2010,2010    Past Surgical History:  Procedure Laterality Date  . IMPLANTABLE CARDIOVERTER DEFIBRILLATOR GENERATOR CHANGE N/A 10/23/2013   Procedure: IMPLANTABLE CARDIOVERTER DEFIBRILLATOR GENERATOR CHANGE;  Surgeon: Deboraha Sprang, MD;  Location: [facility] CATH LAB;  Service: Cardiovascular;  Laterality: N/A;  . SPLENECTOMY    . Status post Medtronic Concerto CRT-D in August 2010 with pocket revision      Social History   Social History  . Marital status: Single    Spouse name: N/A  . Number of children: N/A  . Years of education: N/A   Occupational History  . Not on file.   Social History Main Topics  . Smoking status: Former Research scientist (life sciences)  . Smokeless  tobacco: Never Used  . Alcohol use Yes  . Drug use: No  . Sexual activity: Not on file   Other Topics Concern  . Not on file   Social History Narrative  . No narrative on file    Family History  Problem Relation Age of Onset  . Cancer Mother   . Heart attack Maternal Grandfather   . Stroke Paternal Grandfather   . Hypertension Sister     ROS: no fevers or chills, productive cough, hemoptysis, dysphasia, odynophagia, melena, hematochezia, dysuria, hematuria, rash, seizure activity, orthopnea, PND, pedal edema, claudication. Remaining systems are negative.  Physical Exam: Well-developed well-nourished in no acute distress.  Skin is warm and dry.  HEENT is normal.  Neck is supple.  Chest is clear to auscultation with normal expansion.  Cardiovascular exam is regular rate and rhythm.  Abdominal exam nontender or distended. No masses palpated. Extremities show no edema. neuro grossly intact  ECG

## 2016-02-22 ENCOUNTER — Ambulatory Visit: Payer: Medicare FFS | Admitting: Cardiology

## 2016-03-31 ENCOUNTER — Other Ambulatory Visit: Payer: Self-pay | Admitting: Cardiology

## 2016-03-31 NOTE — Telephone Encounter (Signed)
REFILL 

## 2016-04-14 ENCOUNTER — Other Ambulatory Visit: Payer: Self-pay | Admitting: Cardiology

## 2016-05-05 ENCOUNTER — Other Ambulatory Visit: Payer: Self-pay | Admitting: Cardiology

## 2016-06-04 ENCOUNTER — Other Ambulatory Visit: Payer: Self-pay | Admitting: Cardiology

## 2016-06-22 ENCOUNTER — Encounter: Payer: Self-pay | Admitting: Cardiology

## 2016-06-28 ENCOUNTER — Other Ambulatory Visit: Payer: Self-pay | Admitting: Cardiology

## 2016-06-28 NOTE — Telephone Encounter (Signed)
Rx(s) sent to pharmacy electronically. MD OV 07/01/16

## 2016-06-29 NOTE — Progress Notes (Signed)
HPI: FU CAD, ischemic cardiomyopathy, systolic HF, status post BiV-ICD, V. tach status post ablation, atrial fibrillation on amiodarone.   Since last seen, the patient has dyspnea with more extreme activities but not with routine activities. It is relieved with rest. It is not associated with chest pain. There is no orthopnea, PND or pedal edema. There is no syncope or palpitations. There is no exertional chest pain.    Studies/Reports Reviewed Today:  Echo 09/2013 - EF 15%. There is akinesis of the anteroseptal, inferior, and apical myocardium. Grade 1 diastolic dysfunction.  - Mitral valve: Calcified annulus. There was mild regurgitation. - Left atrium: The atrium was mildly dilated. Impressions: Anteroseptal and apical akinesis; overall severely reduced LVfunction; using contrast no apical thrombus noted.  Myoview 08/2011 Large area of scar in the anterior, anteroseptal, inferoseptal, inferior, anterolateral and apical distributions.  No signif ischemia. LV Ejection Fraction: 14%. LV Wall Motion: Severe diffuse hypokinesis, inferior, apical akinesis.  Cardiac catheterization 10/2008 LM: Okay LAD: Stent patent with 30% ISR, mid and distal 30% LCx: Tiny marginal branch 80% proximal, proximal to this stent patent with 30% ISR, mid CFX 40% RCA: 70% after RV branch then occluded (bridging collaterals) EF 10-15%  Current Outpatient Prescriptions  Medication Sig Dispense Refill  . acetaminophen (TYLENOL) 500 MG tablet Take 1,000 mg by mouth daily as needed for headache.     [name] amiodarone (PACERONE) 200 MG tablet TAKE 1 TABLET BY MOUTH EVERY MORNING AND 1/2 TABLET IN THE EVENING 135 tablet 2  . atorvastatin (LIPITOR) 80 MG tablet TAKE 1 TABLET (80 MG TOTAL) BY MOUTH DAILY. 90 tablet 2  . carvedilol (COREG) 12.5 MG tablet TAKE 1 TABLET (12.5 MG TOTAL) BY MOUTH 2 (TWO) TIMES DAILY. 180 tablet 0  . digoxin (LANOXIN) 0.125 MG tablet TAKE 1/2 TABLET BY MOUTH DAILY 45 tablet  0  . ELIQUIS 5 MG TABS tablet TAKE 1 TABLET BY MOUTH TWICE A DAY 60 tablet 5  . furosemide (LASIX) 40 MG tablet TAKE 1 TABLET (40 MG TOTAL) BY MOUTH 2 (TWO) TIMES DAILY. 180 tablet 3  . metFORMIN (GLUCOPHAGE) 850 MG tablet Take 1 tablet by mouth daily.    . pantoprazole (PROTONIX) 40 MG tablet Take 1 tablet by mouth Daily.    . sacubitril-valsartan (ENTRESTO) 24-26 MG Take 1 tablet by mouth 2 (two) times daily. 180 tablet 1  . traZODone (DESYREL) 50 MG tablet Take 25-100 mg by mouth daily as needed for sleep.      No current facility-administered medications for this visit.      Past Medical History:  Diagnosis Date  . Atrial fibrillation ([place])   . AV BLOCK, COMPLETE   . CAD   . Chronic systolic heart failure ([place])   . Gynecomastia   . HYPERLIPIDEMIA-MIXED   . HYPERTHYROIDISM   . Implantable cardiac defibrillator  medtronic    Initially related 1990s, upgrade to dual-chamber 2002, generator change 2005, generator change August 2010, hematoma evacuation September 2010.  . Ischemic cardiomyopathy   . Long term current use of anticoagulant   . OBESITY-MORBID (>100')   . TRANSAMINASES, SERUM, ELEVATED   . VENTRICULAR TACHYCARDIA    S/P RFCA x3  2006, 2010,2010    Past Surgical History:  Procedure Laterality Date  . IMPLANTABLE CARDIOVERTER DEFIBRILLATOR GENERATOR CHANGE N/A 10/23/2013   Procedure: IMPLANTABLE CARDIOVERTER DEFIBRILLATOR GENERATOR CHANGE;  Surgeon: Deboraha Sprang, MD;  Location: [facility] CATH LAB;  Service: Cardiovascular;  Laterality: N/A;  . SPLENECTOMY    .  Status post Medtronic Concerto CRT-D in August 2010 with pocket revision      Social History   Social History  . Marital status: Single    Spouse name: N/A  . Number of children: N/A  . Years of education: N/A   Occupational History  . Not on file.   Social History Main Topics  . Smoking status: Former Research scientist (life sciences)  . Smokeless tobacco: Never Used  . Alcohol use Yes  . Drug use: No  . Sexual activity: Not on  file   Other Topics Concern  . Not on file   Social History Narrative  . No narrative on file    Family History  Problem Relation Age of Onset  . Cancer Mother   . Heart attack Maternal Grandfather   . Stroke Paternal Grandfather   . Hypertension Sister     ROS: night sweats but no fevers or chills, productive cough, hemoptysis, dysphasia, odynophagia, melena, hematochezia, dysuria, hematuria, rash, seizure activity, orthopnea, PND, pedal edema, claudication. Remaining systems are negative.  Physical Exam: Well-developed well-nourished in no acute distress.  Skin is warm and dry.  HEENT is normal.  Neck is supple. No bruits Chest is clear to auscultation with normal expansion.  Cardiovascular exam is regular rate and rhythm.  Abdominal exam nontender or distended. No masses palpated. Extremities show no edema. neuro grossly intact  ECG-AV paced; personally reviewed  A/P  1 Paroxysmal atrial fibrillation-continue amiodarone. Check TSH, liver functions and chest x-ray. Continue carvedilol. Continue apixaban. Check hemoglobin and renal function.  2 prior ICD-followed by electrophysiology.  3 coronary artery disease-continue statin. No aspirin given need for anticoagulation.  4 hyperlipidemia-continue statin. Check lipids and liver.  5 ischemic cardiomyopathy-continue entresto and coreg. Repeat echo.  6 chronic systolic congestive heart failure-patient appears to be euvolemic on examination. Continue present dose of diuretics. Check potassium and renal function.  7 ventricular tachycardia-continue amiodarone.  Kirk Ruths, MD

## 2016-07-01 ENCOUNTER — Ambulatory Visit (HOSPITAL_COMMUNITY)
Admission: RE | Admit: 2016-07-01 | Discharge: 2016-07-01 | Disposition: A | Discharge status: Home / Self Care | Payer: Medicare FFS | Source: Ambulatory Visit | Attending: Cardiology | Admitting: Cardiology

## 2016-07-01 ENCOUNTER — Ambulatory Visit (INDEPENDENT_AMBULATORY_CARE_PROVIDER_SITE_OTHER): Payer: Medicare FFS | Admitting: Cardiology

## 2016-07-01 ENCOUNTER — Encounter: Payer: Self-pay | Admitting: Cardiology

## 2016-07-01 VITALS — BP 118/78 | HR 70 | Ht 70.0 in | Wt 173.8 lb

## 2016-07-01 DIAGNOSIS — Z79899 Other long term (current) drug therapy: Secondary | ICD-10-CM | POA: Insufficient documentation

## 2016-07-01 DIAGNOSIS — I255 Ischemic cardiomyopathy: Secondary | ICD-10-CM | POA: Diagnosis not present

## 2016-07-01 DIAGNOSIS — I4891 Unspecified atrial fibrillation: Secondary | ICD-10-CM | POA: Diagnosis present

## 2016-07-01 LAB — BASIC METABOLIC PANEL
BUN: 19 mg/dL (ref 7–25)
CALCIUM: 9.2 mg/dL (ref 8.6–10.3)
CO2: 27 mmol/L (ref 20–31)
CREATININE: 1.23 mg/dL (ref 0.70–1.25)
Chloride: 104 mmol/L (ref 98–110)
Glucose, Bld: 129 mg/dL — ABNORMAL HIGH (ref 65–99)
Potassium: 4.9 mmol/L (ref 3.5–5.3)
Sodium: 139 mmol/L (ref 135–146)

## 2016-07-01 LAB — HEPATIC FUNCTION PANEL
ALBUMIN: 3.9 g/dL (ref 3.6–5.1)
ALT: 18 U/L (ref 9–46)
AST: 21 U/L (ref 10–35)
Alkaline Phosphatase: 63 U/L (ref 40–115)
BILIRUBIN DIRECT: 0.2 mg/dL (ref <=0.2)
Indirect Bilirubin: 0.5 mg/dL (ref 0.2–1.2)
TOTAL PROTEIN: 6.8 g/dL (ref 6.1–8.1)
Total Bilirubin: 0.7 mg/dL (ref 0.2–1.2)

## 2016-07-01 LAB — CBC
HCT: 42.4 % (ref 38.5–50.0)
Hemoglobin: 14.2 g/dL (ref 13.2–17.1)
MCH: 30.9 pg (ref 27.0–33.0)
MCHC: 33.5 g/dL (ref 32.0–36.0)
MCV: 92.4 fL (ref 80.0–100.0)
MPV: 10.3 fL (ref 7.5–12.5)
PLATELETS: 235 10*3/uL (ref 140–400)
RBC: 4.59 MIL/uL (ref 4.20–5.80)
RDW: 16.4 % — ABNORMAL HIGH (ref 11.0–15.0)
WBC: 9.2 10*3/uL (ref 3.8–10.8)

## 2016-07-01 LAB — TSH: TSH: 4.27 mIU/L (ref 0.40–4.50)

## 2016-07-01 MED ORDER — AMIODARONE HCL 200 MG PO TABS: 200.0000 mg | ORAL_TABLET | Freq: Every day | ORAL | 2 refills | Status: DC

## 2016-07-01 NOTE — Patient Instructions (Signed)
Medication Instructions:   MAKE SURE TO TAKE AMIODARONE 200 MG ONCE DAILY  Labwork:  Your physician recommends that you HAVE LAB WORK TODAY  Testing/Procedures:  A chest x-ray takes a picture of the organs and structures inside the chest, including the heart, lungs, and blood vessels. This test can show several things, including, whether the heart is enlarges; whether fluid is building up in the lungs; and whether pacemaker / defibrillator leads are still in place. AT [facility]  Your physician has requested that you have an echocardiogram. Echocardiography is a painless test that uses sound waves to create images of your heart. It provides your doctor with information about the size and shape of your heart and how well your heart's chambers and valves are working. This procedure takes approximately one hour. There are no restrictions for this procedure.   Follow-Up:  Your physician wants you to follow-up in: [place] will receive a reminder letter in the mail two months in advance. If you don't receive a letter, please call our office to schedule the follow-up appointment.   If you need a refill on your cardiac medications before your next appointment, please call your pharmacy.

## 2016-07-06 ENCOUNTER — Ambulatory Visit: Payer: Medicare FFS | Admitting: Internal Medicine

## 2016-07-08 ENCOUNTER — Encounter: Payer: Self-pay | Admitting: Internal Medicine

## 2016-07-08 ENCOUNTER — Encounter (INDEPENDENT_AMBULATORY_CARE_PROVIDER_SITE_OTHER): Payer: Self-pay

## 2016-07-08 ENCOUNTER — Ambulatory Visit (INDEPENDENT_AMBULATORY_CARE_PROVIDER_SITE_OTHER): Payer: Medicare FFS | Admitting: Internal Medicine

## 2016-07-08 VITALS — BP 106/72 | HR 94 | Ht 62.0 in | Wt 175.6 lb

## 2016-07-08 DIAGNOSIS — I4729 Other ventricular tachycardia: Secondary | ICD-10-CM

## 2016-07-08 DIAGNOSIS — Z79899 Other long term (current) drug therapy: Secondary | ICD-10-CM | POA: Diagnosis not present

## 2016-07-08 DIAGNOSIS — I5022 Chronic systolic (congestive) heart failure: Secondary | ICD-10-CM

## 2016-07-08 DIAGNOSIS — I472 Ventricular tachycardia: Secondary | ICD-10-CM

## 2016-07-08 DIAGNOSIS — I255 Ischemic cardiomyopathy: Secondary | ICD-10-CM

## 2016-07-08 DIAGNOSIS — Z95810 Presence of automatic (implantable) cardiac defibrillator: Secondary | ICD-10-CM | POA: Diagnosis not present

## 2016-07-08 LAB — CUP PACEART INCLINIC DEVICE CHECK
Battery Voltage: 2.96 V
Brady Statistic AS VP Percent: 12.32 %
Brady Statistic AS VS Percent: 0.04 %
Brady Statistic RA Percent Paced: 87.63 %
HIGH POWER IMPEDANCE MEASURED VALUE: 58 Ohm
HighPow Impedance: 46 Ohm
Implantable Lead Implant Date: 20000626
Implantable Lead Implant Date: 20000816
Implantable Lead Location: 753859
Implantable Lead Location: 753860
Implantable Lead Model: 6945
Implantable Pulse Generator Implant Date: 20150624
Lead Channel Impedance Value: 342 Ohm
Lead Channel Impedance Value: 437 Ohm
Lead Channel Impedance Value: 494 Ohm
Lead Channel Pacing Threshold Amplitude: 1.25 V
Lead Channel Pacing Threshold Pulse Width: 0.4 ms
Lead Channel Pacing Threshold Pulse Width: 0.4 ms
Lead Channel Pacing Threshold Pulse Width: 0.4 ms
Lead Channel Setting Pacing Amplitude: 2 V
Lead Channel Setting Pacing Amplitude: 2.75 V
Lead Channel Setting Pacing Pulse Width: 0.4 ms
Lead Channel Setting Sensing Sensitivity: 0.3 mV
MDC IDC LEAD IMPLANT DT: 20100818
MDC IDC LEAD IMPLANT DT: 20100818
MDC IDC LEAD LOCATION: 753858
MDC IDC LEAD LOCATION: 753860
MDC IDC MSMT BATTERY REMAINING LONGEVITY: 45 mo
MDC IDC MSMT LEADCHNL LV IMPEDANCE VALUE: 399 Ohm
MDC IDC MSMT LEADCHNL LV IMPEDANCE VALUE: 665 Ohm
MDC IDC MSMT LEADCHNL RA IMPEDANCE VALUE: 437 Ohm
MDC IDC MSMT LEADCHNL RA PACING THRESHOLD AMPLITUDE: 0.75 V
MDC IDC MSMT LEADCHNL RV PACING THRESHOLD AMPLITUDE: 1.25 V
MDC IDC SESS DTM: 20180309140150
MDC IDC SET LEADCHNL LV PACING AMPLITUDE: 2.5 V
MDC IDC SET LEADCHNL RV PACING PULSEWIDTH: 0.4 ms
MDC IDC STAT BRADY AP VP PERCENT: 87.63 %
MDC IDC STAT BRADY AP VS PERCENT: 0.01 %
MDC IDC STAT BRADY RV PERCENT PACED: 99.81 %

## 2016-07-08 NOTE — Patient Instructions (Signed)
Medication Instructions: - Your physician recommends that you continue on your current medications as directed. Please refer to the Current Medication list given to you today.  Labwork: - none ordered  Procedures/Testing: - none ordered  Follow-Up: - Remote monitoring is used to monitor your Pacemaker of ICD from home. This monitoring reduces the number of office visits required to check your device to one time per year. It allows [place] to keep an eye on the functioning of your device to ensure it is working properly. You are scheduled for a device check from home on 10/10/16. You may send your transmission at any time that day. If you have a wireless device, the transmission will be sent automatically. After your physician reviews your transmission, you will receive a postcard with your next transmission date.  - Your physician wants you to follow-up in: 1 year with Dr. Caryl Comes. You will receive a reminder letter in the mail two months in advance. If you don't receive a letter, please call our office to schedule the follow-up appointment.  Any Additional Special Instructions Will Be Listed Below (If Applicable).     If you need a refill on your cardiac medications before your next appointment, please call your pharmacy.

## 2016-07-08 NOTE — Progress Notes (Signed)
Patient Care Team: Pcp Not In System as PCP - General   HPI  Michael Krueger is a 61 y.o. male Seen in followup for VT with ICD-CRT implantation   for congestive heart failure in the setting of ischemic heart cardiomyopathy  He has a history of ventricular tachycardia status post ablation. He also has a history of atrial fibrillation and he takes amiodarone for the combination of the 2. . He underwent later generator replacement 10/23/13   Last cath performed in July of 2010 and revealed an occluded RCA but nonobstructive disease in the Lcx and LAD other than ostial 80 in a small marginal; EF 10-15.  Echocardiogram 6/15 demonstrated EF of 15%.    Date TSH LFTs Dig  12/16  2.53 20    3/18  4.27 18 0.5     The patient denies chest pain, orthopnea or peripheral edema. He has had some problems recently with PND. He has no edmea   There have been no palpitations or syncope.   Some lightheadedness which is very brief. It is not associated with changes in position. He does have low blood pressure.    Past Medical History:  Diagnosis Date  . Atrial fibrillation ([place])   . AV BLOCK, COMPLETE   . CAD   . Chronic systolic heart failure ([place])   . Gynecomastia   . HYPERLIPIDEMIA-MIXED   . HYPERTHYROIDISM   . Implantable cardiac defibrillator  medtronic    Initially related 1990s, upgrade to dual-chamber 2002, generator change 2005, generator change August 2010, hematoma evacuation September 2010.  . Ischemic cardiomyopathy   . Long term current use of anticoagulant   . OBESITY-MORBID (>100')   . TRANSAMINASES, SERUM, ELEVATED   . VENTRICULAR TACHYCARDIA    S/P RFCA x3  2006, 2010,2010    Past Surgical History:  Procedure Laterality Date  . IMPLANTABLE CARDIOVERTER DEFIBRILLATOR GENERATOR CHANGE N/A 10/23/2013   Procedure: IMPLANTABLE CARDIOVERTER DEFIBRILLATOR GENERATOR CHANGE;  Surgeon: Deboraha Sprang, MD;  Location: [facility] CATH LAB;  Service: Cardiovascular;   Laterality: N/A;  . SPLENECTOMY    . Status post Medtronic Concerto CRT-D in August 2010 with pocket revision      Current Outpatient Prescriptions  Medication Sig Dispense Refill  . acetaminophen (TYLENOL) 500 MG tablet Take 1,000 mg by mouth daily as needed for headache.     [name] amiodarone (PACERONE) 200 MG tablet Take 1 tablet (200 mg total) by mouth daily. 135 tablet 2  . atorvastatin (LIPITOR) 80 MG tablet TAKE 1 TABLET (80 MG TOTAL) BY MOUTH DAILY. 90 tablet 2  . carvedilol (COREG) 12.5 MG tablet TAKE 1 TABLET (12.5 MG TOTAL) BY MOUTH 2 (TWO) TIMES DAILY. 180 tablet 0  . digoxin (LANOXIN) 0.125 MG tablet TAKE 1/2 TABLET BY MOUTH DAILY 45 tablet 0  . ELIQUIS 5 MG TABS tablet TAKE 1 TABLET BY MOUTH TWICE A DAY 60 tablet 5  . furosemide (LASIX) 40 MG tablet TAKE 1 TABLET (40 MG TOTAL) BY MOUTH 2 (TWO) TIMES DAILY. 180 tablet 3  . metFORMIN (GLUCOPHAGE) 850 MG tablet Take 1 tablet by mouth daily.    . pantoprazole (PROTONIX) 40 MG tablet Take 1 tablet by mouth Daily.    . sacubitril-valsartan (ENTRESTO) 24-26 MG Take 1 tablet by mouth 2 (two) times daily. 180 tablet 1  . traZODone (DESYREL) 50 MG tablet Take 25-100 mg by mouth daily as needed for sleep.      No current facility-administered medications for this visit.  Allergies  Allergen Reactions  . Other Itching and Other (See Comments)    CHG wipes/  Caused a rash    Review of Systems negative except from HPI and PMH  Physical Exam BP 106/72   Pulse 94   Ht '5\' 2"'$  (1.575 m)   Wt 175 lb 9.6 oz (79.7 kg)   SpO2 98%   BMI 32.12 kg/m  Well developed and well nourished in no acute distress HENT normal E scleral and icterus clear Neck Supple JVP flat; carotids brisk and full  No bruits  Clear to ausculation Device pocket well healed; without hematoma or erythema.  There is no tethering  Regular rate and rhythm, no murmurs gallops or rub Soft with active bowel sounds No clubbing cyanosis   Edema Alert and oriented,  grossly normal motor and sensory function Skin Warm and Dry  ECG demonstrates AV pacing with an up right QRS in lead V1 and negative QRS in lead 1  Assessment and  Plan  Congestive heart failure-chronic-systolic  Ventricular tachycardia  Implantable defibrillator  Ischemic cardiomyopathy  There has been no interval ventricular tachycardia.   amio labs ok  Without symptoms of ischemia  Euvolemic continue current meds    On Anticoagulation;  No bleeding issues   Marked decrease in activity over recent months but he ascribes to winter      .

## 2016-07-19 ENCOUNTER — Ambulatory Visit (HOSPITAL_COMMUNITY): Payer: Medicare FFS | Attending: Cardiology

## 2016-07-19 ENCOUNTER — Other Ambulatory Visit: Payer: Self-pay

## 2016-07-19 DIAGNOSIS — Z87891 Personal history of nicotine dependence: Secondary | ICD-10-CM | POA: Diagnosis not present

## 2016-07-19 DIAGNOSIS — I340 Nonrheumatic mitral (valve) insufficiency: Secondary | ICD-10-CM | POA: Diagnosis not present

## 2016-07-19 DIAGNOSIS — I255 Ischemic cardiomyopathy: Secondary | ICD-10-CM | POA: Diagnosis not present

## 2016-07-19 DIAGNOSIS — I509 Heart failure, unspecified: Secondary | ICD-10-CM | POA: Insufficient documentation

## 2016-07-19 DIAGNOSIS — I472 Ventricular tachycardia: Secondary | ICD-10-CM | POA: Insufficient documentation

## 2016-07-19 DIAGNOSIS — I2510 Atherosclerotic heart disease of native coronary artery without angina pectoris: Secondary | ICD-10-CM | POA: Diagnosis not present

## 2016-07-19 DIAGNOSIS — I358 Other nonrheumatic aortic valve disorders: Secondary | ICD-10-CM | POA: Insufficient documentation

## 2016-07-19 DIAGNOSIS — Z95810 Presence of automatic (implantable) cardiac defibrillator: Secondary | ICD-10-CM | POA: Diagnosis not present

## 2016-07-19 DIAGNOSIS — I4891 Unspecified atrial fibrillation: Secondary | ICD-10-CM | POA: Diagnosis not present

## 2016-07-19 DIAGNOSIS — R29898 Other symptoms and signs involving the musculoskeletal system: Secondary | ICD-10-CM | POA: Insufficient documentation

## 2016-07-19 DIAGNOSIS — I517 Cardiomegaly: Secondary | ICD-10-CM | POA: Diagnosis not present

## 2016-07-19 MED ORDER — PERFLUTREN LIPID MICROSPHERE
2.0000 mL | INTRAVENOUS | Status: AC | PRN
  Administered 2016-07-19: 2 mL via INTRAVENOUS

## 2016-07-20 ENCOUNTER — Other Ambulatory Visit: Payer: Self-pay | Admitting: Cardiology

## 2016-07-22 ENCOUNTER — Other Ambulatory Visit: Payer: Self-pay | Admitting: Cardiology

## 2016-07-22 DIAGNOSIS — I255 Ischemic cardiomyopathy: Secondary | ICD-10-CM

## 2016-08-02 ENCOUNTER — Other Ambulatory Visit: Payer: Self-pay | Admitting: Cardiology

## 2016-08-16 ENCOUNTER — Telehealth: Payer: Self-pay | Admitting: Cardiology

## 2016-08-16 NOTE — Telephone Encounter (Signed)
Michael Krueger is calling to speak to someone to see if he can stop his Eliquis for 3 days prior to the surgery  . He is having minor surgery on 08/23/16 .Please call

## 2016-08-16 NOTE — Telephone Encounter (Signed)
Patient requesting to hold eliquis for upcoming procedure:  Surgeon: Dr. Burt Knack  Procedure: Removing cyst on his back  Scheduled: 4/24  Meds to hold: Eliquis  Forwarded to Dr. Stanford Breed for review

## 2016-08-17 NOTE — Telephone Encounter (Signed)
Patient aware and verbalized understanding. °

## 2016-08-17 NOTE — Telephone Encounter (Signed)
Hold apixaban 2 days prior to procedure and resume after when ok with surgery Kirk Ruths

## 2016-08-17 NOTE — Telephone Encounter (Signed)
lmtcb

## 2016-10-10 ENCOUNTER — Ambulatory Visit (INDEPENDENT_AMBULATORY_CARE_PROVIDER_SITE_OTHER): Payer: Medicare FFS | Admitting: *Deleted

## 2016-10-10 ENCOUNTER — Telehealth: Payer: Self-pay | Admitting: Cardiology

## 2016-10-10 DIAGNOSIS — I255 Ischemic cardiomyopathy: Secondary | ICD-10-CM

## 2016-10-10 DIAGNOSIS — I5022 Chronic systolic (congestive) heart failure: Secondary | ICD-10-CM

## 2016-10-10 NOTE — Telephone Encounter (Signed)
Spoke with pt and reminded pt of remote transmission that is due today. Pt verbalized understanding.   

## 2016-10-11 LAB — CUP PACEART REMOTE DEVICE CHECK
Battery Remaining Longevity: 39 mo
Brady Statistic AP VP Percent: 88.27 %
Brady Statistic AP VS Percent: 0.01 %
Brady Statistic AS VP Percent: 11.71 %
Brady Statistic RV Percent Paced: 99.92 %
Date Time Interrogation Session: 20180611201659
HighPow Impedance: 46 Ohm
HighPow Impedance: 58 Ohm
Implantable Lead Implant Date: 20000626
Implantable Lead Implant Date: 20000816
Implantable Lead Implant Date: 20100818
Implantable Lead Implant Date: 20100818
Implantable Lead Location: 753858
Implantable Lead Location: 753859
Implantable Lead Location: 753860
Implantable Lead Model: 6945
Lead Channel Impedance Value: 399 Ohm
Lead Channel Impedance Value: 399 Ohm
Lead Channel Impedance Value: 437 Ohm
Lead Channel Impedance Value: 665 Ohm
Lead Channel Pacing Threshold Pulse Width: 0.4 ms
Lead Channel Setting Pacing Amplitude: 2.5 V
Lead Channel Setting Pacing Amplitude: 3 V
Lead Channel Setting Pacing Pulse Width: 0.4 ms
Lead Channel Setting Pacing Pulse Width: 0.4 ms
MDC IDC LEAD LOCATION: 753860
MDC IDC MSMT BATTERY VOLTAGE: 2.96 V
MDC IDC MSMT LEADCHNL RA SENSING INTR AMPL: 2.125 mV
MDC IDC MSMT LEADCHNL RA SENSING INTR AMPL: 2.125 mV
MDC IDC MSMT LEADCHNL RV IMPEDANCE VALUE: 323 Ohm
MDC IDC MSMT LEADCHNL RV IMPEDANCE VALUE: 456 Ohm
MDC IDC MSMT LEADCHNL RV PACING THRESHOLD AMPLITUDE: 1.5 V
MDC IDC PG IMPLANT DT: 20150624
MDC IDC SET LEADCHNL RA PACING AMPLITUDE: 2 V
MDC IDC SET LEADCHNL RV SENSING SENSITIVITY: 0.3 mV
MDC IDC STAT BRADY AS VS PERCENT: 0.01 %
MDC IDC STAT BRADY RA PERCENT PACED: 88.27 %

## 2016-10-11 NOTE — Progress Notes (Signed)
Remote ICD transmission.   

## 2016-10-12 ENCOUNTER — Encounter: Payer: Self-pay | Admitting: Cardiology

## 2016-10-21 ENCOUNTER — Other Ambulatory Visit: Payer: Self-pay | Admitting: Cardiology

## 2017-01-09 ENCOUNTER — Ambulatory Visit (INDEPENDENT_AMBULATORY_CARE_PROVIDER_SITE_OTHER): Payer: Medicare FFS | Admitting: *Deleted

## 2017-01-09 DIAGNOSIS — I255 Ischemic cardiomyopathy: Secondary | ICD-10-CM | POA: Diagnosis not present

## 2017-01-09 NOTE — Progress Notes (Signed)
Remote ICD transmission.   

## 2017-01-10 LAB — CUP PACEART REMOTE DEVICE CHECK
Battery Voltage: 2.96 V
Brady Statistic AS VS Percent: 0.01 %
Brady Statistic RA Percent Paced: 87.09 %
Date Time Interrogation Session: 20180910083823
HIGH POWER IMPEDANCE MEASURED VALUE: 40 Ohm
HIGH POWER IMPEDANCE MEASURED VALUE: 48 Ohm
Implantable Lead Implant Date: 20100818
Implantable Lead Location: 753858
Implantable Lead Location: 753860
Implantable Lead Model: 5076
Implantable Pulse Generator Implant Date: 20150624
Lead Channel Impedance Value: 304 Ohm
Lead Channel Impedance Value: 399 Ohm
Lead Channel Impedance Value: 437 Ohm
Lead Channel Pacing Threshold Amplitude: 1.25 V
Lead Channel Sensing Intrinsic Amplitude: 1.875 mV
Lead Channel Sensing Intrinsic Amplitude: 1.875 mV
Lead Channel Setting Pacing Amplitude: 2 V
Lead Channel Setting Pacing Pulse Width: 0.4 ms
Lead Channel Setting Sensing Sensitivity: 0.3 mV
MDC IDC LEAD IMPLANT DT: 20000626
MDC IDC LEAD IMPLANT DT: 20000816
MDC IDC LEAD IMPLANT DT: 20100818
MDC IDC LEAD LOCATION: 753859
MDC IDC LEAD LOCATION: 753860
MDC IDC MSMT BATTERY REMAINING LONGEVITY: 38 mo
MDC IDC MSMT LEADCHNL LV IMPEDANCE VALUE: 399 Ohm
MDC IDC MSMT LEADCHNL LV IMPEDANCE VALUE: 703 Ohm
MDC IDC MSMT LEADCHNL RA IMPEDANCE VALUE: 399 Ohm
MDC IDC MSMT LEADCHNL RV PACING THRESHOLD PULSEWIDTH: 0.4 ms
MDC IDC SET LEADCHNL LV PACING AMPLITUDE: 2.5 V
MDC IDC SET LEADCHNL LV PACING PULSEWIDTH: 0.4 ms
MDC IDC SET LEADCHNL RV PACING AMPLITUDE: 2.5 V
MDC IDC STAT BRADY AP VP PERCENT: 87.09 %
MDC IDC STAT BRADY AP VS PERCENT: 0.01 %
MDC IDC STAT BRADY AS VP PERCENT: 12.9 %
MDC IDC STAT BRADY RV PERCENT PACED: 99.94 %

## 2017-01-10 NOTE — Progress Notes (Signed)
HPI: FU CAD, ischemic cardiomyopathy, systolic HF, status post BiV-ICD, V. tach status post ablation, atrial fibrillation on amiodarone.   Since last seen, the patient has dyspnea with more extreme activities but not with routine activities. It is relieved with rest. It is not associated with chest pain. There is no orthopnea, PND or pedal edema. There is no syncope or palpitations. There is no exertional chest pain.     Studies/Reports Reviewed Today:  Echo 3/18 - EF 15, grade 2 DD, mild MR, severe LAE  Myoview 08/2011 Large area of scar in the anterior, anteroseptal, inferoseptal, inferior, anterolateral and apical distributions.  No signif ischemia. LV Ejection Fraction: 14%. LV Wall Motion: Severe diffuse hypokinesis, inferior, apical akinesis.  Cardiac catheterization 10/2008 LM: Okay LAD: Stent patent with 30% ISR, mid and distal 30% LCx: Tiny marginal branch 80% proximal, proximal to this stent patent with 30% ISR, mid CFX 40% RCA: 70% after RV branch then occluded (bridging collaterals) EF 10-15%  Current Outpatient Prescriptions  Medication Sig Dispense Refill  . acetaminophen (TYLENOL) 500 MG tablet Take 1,000 mg by mouth daily as needed for headache.     [name] amiodarone (PACERONE) 200 MG tablet Take 1 tablet (200 mg total) by mouth daily. 135 tablet 2  . atorvastatin (LIPITOR) 80 MG tablet TAKE 1 TABLET (80 MG TOTAL) BY MOUTH DAILY. 90 tablet 2  . carvedilol (COREG) 12.5 MG tablet TAKE 1 TABLET (12.5 MG TOTAL) BY MOUTH 2 (TWO) TIMES DAILY. 180 tablet 1  . digoxin (LANOXIN) 0.125 MG tablet TAKE 1/2 TABLET BY MOUTH DAILY 45 tablet 0  . ELIQUIS 5 MG TABS tablet TAKE 1 TABLET BY MOUTH TWICE A DAY 60 tablet 5  . furosemide (LASIX) 40 MG tablet TAKE 1 TABLET (40 MG TOTAL) BY MOUTH 2 (TWO) TIMES DAILY. 180 tablet 3  . metFORMIN (GLUCOPHAGE) 850 MG tablet Take 1,275 mg by mouth daily.     . pantoprazole (PROTONIX) 40 MG tablet Take 1 tablet by mouth Daily.    .  sacubitril-valsartan (ENTRESTO) 24-26 MG Take 1 tablet by mouth 2 (two) times daily. 180 tablet 3  . traZODone (DESYREL) 50 MG tablet Take 25-100 mg by mouth daily as needed for sleep.      No current facility-administered medications for this visit.      Past Medical History:  Diagnosis Date  . Atrial fibrillation ([place])   . AV BLOCK, COMPLETE   . CAD   . Chronic systolic heart failure ([place])   . Gynecomastia   . HYPERLIPIDEMIA-MIXED   . HYPERTHYROIDISM   . Implantable cardiac defibrillator  medtronic    Initially related 1990s, upgrade to dual-chamber 2002, generator change 2005, generator change August 2010, hematoma evacuation September 2010.  . Ischemic cardiomyopathy   . Long term current use of anticoagulant   . OBESITY-MORBID (>100')   . TRANSAMINASES, SERUM, ELEVATED   . VENTRICULAR TACHYCARDIA    S/P RFCA x3  2006, 2010,2010    Past Surgical History:  Procedure Laterality Date  . IMPLANTABLE CARDIOVERTER DEFIBRILLATOR GENERATOR CHANGE N/A 10/23/2013   Procedure: IMPLANTABLE CARDIOVERTER DEFIBRILLATOR GENERATOR CHANGE;  Surgeon: Deboraha Sprang, MD;  Location: [facility] CATH LAB;  Service: Cardiovascular;  Laterality: N/A;  . SPLENECTOMY    . Status post Medtronic Concerto CRT-D in August 2010 with pocket revision      Social History   Social History  . Marital status: Single    Spouse name: N/A  . Number of children: N/A  .  Years of education: N/A   Occupational History  . Not on file.   Social History Main Topics  . Smoking status: Former Research scientist (life sciences)  . Smokeless tobacco: Never Used  . Alcohol use Yes  . Drug use: No  . Sexual activity: Not on file   Other Topics Concern  . Not on file   Social History Narrative  . No narrative on file    Family History  Problem Relation Age of Onset  . Cancer Mother   . Heart attack Maternal Grandfather   . Stroke Paternal Grandfather   . Hypertension Sister     ROS: no fevers or chills, productive cough, hemoptysis,  dysphasia, odynophagia, melena, hematochezia, dysuria, hematuria, rash, seizure activity, orthopnea, PND, pedal edema, claudication. Remaining systems are negative.  Physical Exam: Well-developed well-nourished in no acute distress.  Skin is warm and dry.  HEENT is normal.  Neck is supple.  Chest is clear to auscultation with normal expansion.  Cardiovascular exam is regular rate and rhythm.  Abdominal exam nontender or distended. No masses palpated. Ventral hernia noted.  Extremities show no edema. neuro grossly intact   A/P  1 chronic systolic congestive heart failure-patient appears to be euvolemic on examination. Continue present dose of Lasix. Check potassium and renal function. Patient instructed on low sodium diet and fluid restriction.  2 paroxysmal atrial fibrillation-continue amiodarone. Check TSH, liver functions and chest x-ray. Continue apixaban. Check hemoglobin and renal function.  3 coronary artery disease-continue statin. Patient not on aspirin because of need for anticoagulation.  4 ischemic cardiomyopathy-continue entresto and carvedilol.  5 prior ICD-followed by electrophysiology.  6 ventricular tachycardia-continue amiodarone.  7 hyperlipidemia-continue statin.  Kirk Ruths, MD

## 2017-01-11 ENCOUNTER — Encounter: Payer: Self-pay | Admitting: Cardiology

## 2017-01-16 ENCOUNTER — Encounter: Payer: Self-pay | Admitting: Cardiology

## 2017-01-16 ENCOUNTER — Other Ambulatory Visit: Payer: Self-pay | Admitting: Cardiology

## 2017-01-16 ENCOUNTER — Ambulatory Visit (INDEPENDENT_AMBULATORY_CARE_PROVIDER_SITE_OTHER): Payer: Medicare FFS | Admitting: Cardiology

## 2017-01-16 ENCOUNTER — Ambulatory Visit (HOSPITAL_COMMUNITY)
Admission: RE | Admit: 2017-01-16 | Discharge: 2017-01-16 | Disposition: A | Discharge status: Home / Self Care | Payer: Medicare FFS | Source: Ambulatory Visit | Attending: Cardiology | Admitting: Cardiology

## 2017-01-16 VITALS — BP 108/64 | HR 76 | Ht 62.0 in | Wt 170.0 lb

## 2017-01-16 DIAGNOSIS — I480 Paroxysmal atrial fibrillation: Secondary | ICD-10-CM

## 2017-01-16 DIAGNOSIS — I2510 Atherosclerotic heart disease of native coronary artery without angina pectoris: Secondary | ICD-10-CM | POA: Diagnosis not present

## 2017-01-16 DIAGNOSIS — I255 Ischemic cardiomyopathy: Secondary | ICD-10-CM

## 2017-01-16 DIAGNOSIS — I5022 Chronic systolic (congestive) heart failure: Secondary | ICD-10-CM

## 2017-01-16 NOTE — Patient Instructions (Signed)
Medication Instructions:   NO CHANGE  Labwork:  Your physician recommends that you HAVE LAB WORK TODAY  Testing/Procedures:  A chest x-ray takes a picture of the organs and structures inside the chest, including the heart, lungs, and blood vessels. This test can show several things, including, whether the heart is enlarges; whether fluid is building up in the lungs; and whether pacemaker / defibrillator leads are still in place. AT [facility]  Follow-Up:  Your physician wants you to follow-up in: [place] will receive a reminder letter in the mail two months in advance. If you don't receive a letter, please call our office to schedule the follow-up appointment.   If you need a refill on your cardiac medications before your next appointment, please call your pharmacy.

## 2017-01-17 ENCOUNTER — Other Ambulatory Visit: Payer: Self-pay | Admitting: Cardiology

## 2017-01-17 ENCOUNTER — Encounter: Payer: Self-pay | Admitting: *Deleted

## 2017-01-17 LAB — BASIC METABOLIC PANEL
BUN/Creatinine Ratio: 10 (ref 10–24)
BUN: 12 mg/dL (ref 8–27)
CALCIUM: 8.9 mg/dL (ref 8.6–10.2)
CHLORIDE: 102 mmol/L (ref 96–106)
CO2: 23 mmol/L (ref 20–29)
Creatinine, Ser: 1.24 mg/dL (ref 0.76–1.27)
GFR calc non Af Amer: 62 mL/min/{1.73_m2} (ref >59)
GFR, EST AFRICAN AMERICAN: 72 mL/min/{1.73_m2} (ref >59)
Glucose: 156 mg/dL — ABNORMAL HIGH (ref 65–99)
POTASSIUM: 3.9 mmol/L (ref 3.5–5.2)
Sodium: 141 mmol/L (ref 134–144)

## 2017-01-17 LAB — CBC
Hematocrit: 40.8 % (ref 37.5–51.0)
Hemoglobin: 13.7 g/dL (ref 13.0–17.7)
MCH: 31.1 pg (ref 26.6–33.0)
MCHC: 33.6 g/dL (ref 31.5–35.7)
MCV: 93 fL (ref 79–97)
PLATELETS: 256 10*3/uL (ref 150–379)
RBC: 4.41 x10E6/uL (ref 4.14–5.80)
RDW: 16.5 % — AB (ref 12.3–15.4)
WBC: 8 10*3/uL (ref 3.4–10.8)

## 2017-01-17 LAB — HEPATIC FUNCTION PANEL
ALBUMIN: 4.2 g/dL (ref 3.6–4.8)
ALT: 23 IU/L (ref 0–44)
AST: 27 IU/L (ref 0–40)
Alkaline Phosphatase: 87 IU/L (ref 39–117)
BILIRUBIN TOTAL: 0.6 mg/dL (ref 0.0–1.2)
Bilirubin, Direct: 0.19 mg/dL (ref 0.00–0.40)
Total Protein: 7 g/dL (ref 6.0–8.5)

## 2017-01-17 LAB — TSH: TSH: 2.97 u[IU]/mL (ref 0.450–4.500)

## 2017-01-17 NOTE — Telephone Encounter (Signed)
Rx(s) sent to pharmacy electronically.  

## 2017-01-22 ENCOUNTER — Other Ambulatory Visit: Payer: Self-pay | Admitting: Cardiology

## 2017-02-08 ENCOUNTER — Other Ambulatory Visit: Payer: Self-pay | Admitting: Cardiology

## 2017-02-08 NOTE — Telephone Encounter (Signed)
New Message   pt verbalized that he is returning call for rn   He said that the pharmacy is trying to reach rn about   eliquis 5mg  refills and that he is out    *STAT* If patient is at the pharmacy, call can be transferred to refill team.   1. Which medications need to be refilled? (please list name of each medication and dose if known) eliquis 5mg  2xday  2. Which pharmacy/location (including street and city if local pharmacy) is medication to be sent to? [facility] [place]   3. Do they need a 30 day or 90 day supply? [place]

## 2017-02-19 ENCOUNTER — Other Ambulatory Visit: Payer: Self-pay | Admitting: Cardiology

## 2017-02-20 NOTE — Telephone Encounter (Signed)
REFILL 

## 2017-04-04 ENCOUNTER — Other Ambulatory Visit: Payer: Self-pay | Admitting: Cardiology

## 2017-04-04 DIAGNOSIS — I4891 Unspecified atrial fibrillation: Secondary | ICD-10-CM

## 2017-04-07 ENCOUNTER — Telehealth: Payer: Self-pay | Admitting: Cardiology

## 2017-04-07 NOTE — Telephone Encounter (Signed)
° °  [facility] Pre-operative Risk Assessment    Request for surgical clearance:  1. What type of surgery is being performed?Colonoscopy   2. When is this surgery scheduled? 06-09-17   3. Are there any medications that need to be held prior to surgery and how long?Can pt sop Coumadin,If so when and how long   4. Practice name and name of physician performing surgery? Dr Delbert Phenix   5. What is your office phone and fax number? [phone] and fax is [phone]   6. Anesthesia type (None, local, MAC, general) ? Propasal   Michael Krueger 04/07/2017, 11:09 AM  _________________________________________________________________   (provider comments below)

## 2017-04-10 ENCOUNTER — Ambulatory Visit (INDEPENDENT_AMBULATORY_CARE_PROVIDER_SITE_OTHER): Payer: Medicare FFS | Admitting: *Deleted

## 2017-04-10 DIAGNOSIS — I255 Ischemic cardiomyopathy: Secondary | ICD-10-CM | POA: Diagnosis not present

## 2017-04-10 NOTE — Telephone Encounter (Signed)
[place] for colonoscopy; hold apixaban 2 days prior to procedure and resume after when ok with GI Kirk Ruths

## 2017-04-10 NOTE — Telephone Encounter (Signed)
   Primary Cardiologist: Kirk Ruths, MD  Chart reviewed as part of pre-operative protocol coverage. Patient was contacted 04/10/2017 in reference to pre-operative risk assessment for pending surgery as outlined below.  Michael Krueger was last seen on 01/16/17 by Dr. Stanford Breed. H/o severe LV dysfunction/ICM/chronic systolic CHF s/p ICD with last EF 15%, CAD, PAF on Eliquis/amiodarone, ventricular tachycardia.  I tried to call patient on number listed but received voicemail.Given complex PMH as above, will forward to Dr. Stanford Breed for thoughts on pre-op clearance for colonoscopy in order to optimize status prior to clearing, or thoughts on undergoing procedure. Revised cardiac risk index was calculated at 6.6% indicating he is not at low risk for cardiac complication. Will also forward to pharm in interim for input on Eliquis.  Charlie Pitter, PA-C 04/10/2017, 9:56 AM

## 2017-04-10 NOTE — Progress Notes (Signed)
Remote ICD transmission.   

## 2017-04-12 NOTE — Telephone Encounter (Signed)
   Primary Cardiologist: Kirk Ruths, MD  Chart reviewed as part of pre-operative protocol coverage. This has been discussed with Dr. Stanford Breed and he has been cleared to proceed with planned colonoscopy. Hold Eliquis 2 days prior to the procedure and resume that evening or the day after, at the discretion of the Procedural MD.   I will route this recommendation to the requesting party via [place] fax function and remove from pre-op pool.  Please call with questions.  Erma Heritage, PA-C 04/12/2017, 3:16 PM

## 2017-04-13 LAB — CUP PACEART REMOTE DEVICE CHECK
Battery Remaining Longevity: 32 mo
Battery Voltage: 2.95 V
Brady Statistic AP VP Percent: 85.1 %
Brady Statistic AP VS Percent: 0.01 %
Brady Statistic AS VP Percent: 14.87 %
Brady Statistic AS VS Percent: 0.02 %
Brady Statistic RA Percent Paced: 85.1 %
Brady Statistic RV Percent Paced: 99.85 %
Date Time Interrogation Session: 20181210051804
HighPow Impedance: 43 Ohm
HighPow Impedance: 56 Ohm
Implantable Lead Implant Date: 20000626
Implantable Lead Implant Date: 20000816
Implantable Lead Implant Date: 20100818
Implantable Lead Implant Date: 20100818
Implantable Lead Location: 753858
Implantable Lead Location: 753859
Implantable Lead Location: 753860
Implantable Lead Location: 753860
Implantable Lead Model: 5076
Implantable Lead Model: 6940
Implantable Lead Model: 6945
Implantable Pulse Generator Implant Date: 20150624
Lead Channel Impedance Value: 304 Ohm
Lead Channel Impedance Value: 380 Ohm
Lead Channel Impedance Value: 380 Ohm
Lead Channel Impedance Value: 399 Ohm
Lead Channel Impedance Value: 456 Ohm
Lead Channel Impedance Value: 665 Ohm
Lead Channel Pacing Threshold Amplitude: 1.375 V
Lead Channel Pacing Threshold Pulse Width: 0.4 ms
Lead Channel Sensing Intrinsic Amplitude: 1.75 mV
Lead Channel Sensing Intrinsic Amplitude: 1.75 mV
Lead Channel Setting Pacing Amplitude: 2 V
Lead Channel Setting Pacing Amplitude: 2.5 V
Lead Channel Setting Pacing Amplitude: 2.75 V
Lead Channel Setting Pacing Pulse Width: 0.4 ms
Lead Channel Setting Pacing Pulse Width: 0.4 ms
Lead Channel Setting Sensing Sensitivity: 0.3 mV

## 2017-04-14 ENCOUNTER — Encounter: Payer: Self-pay | Admitting: Cardiology

## 2017-04-19 ENCOUNTER — Telehealth: Payer: Self-pay | Admitting: Cardiology

## 2017-04-19 NOTE — Telephone Encounter (Signed)
LMOM to return call device clinic.

## 2017-04-19 NOTE — Telephone Encounter (Signed)
Patient has some questions about his ICD. Please call back.

## 2017-04-19 NOTE — Telephone Encounter (Signed)
Michael Krueger calling back to find out what his limits were on his ICD. He has recently joined a fitness group and wanted to know what his max HR should be. I advised him not to get his HR above 160bpm. He reported that 86bpm was as high as he got his HR today. I let him know to call back if he had issues with his HR or ICD.

## 2017-04-27 ENCOUNTER — Telehealth: Payer: Self-pay | Admitting: Cardiology

## 2017-04-27 NOTE — Telephone Encounter (Signed)
Patient was previously cleared as outlined in phone note from 04/12/2017. Can hold Eliquis two days prior to the procedure. Will fax manually.   Signed, Erma Heritage, PA-C 04/27/2017, 3:24 PM

## 2017-04-27 NOTE — Telephone Encounter (Signed)
° °  [facility] Pre-operative Risk Assessment    Request for surgical clearance:  1. What type of surgery is being performed? Colonoscopy, may need to do Biopsys  2. When is this surgery scheduled? 06-09-17  3. Are there any medications that need to be held prior to surgery and how long? Eliquis  for 3 days prior  4. Practice name and name of physician performing surgery? [facility]// Dr.James Curtiss  5. What is your office phone and fax number? Office # [phone] option 5 fax [phone]  6. Anesthesia type (None, local, MAC, general) ? Propasal    Michael Krueger 04/27/2017, 10:31 AM  _________________________________________________________________   (provider comments below)

## 2017-05-01 ENCOUNTER — Encounter: Payer: Self-pay | Admitting: Cardiology

## 2017-05-01 ENCOUNTER — Telehealth: Payer: Self-pay | Admitting: Cardiology

## 2017-05-01 NOTE — Telephone Encounter (Signed)
This encounter was created in error - please disregard.

## 2017-05-01 NOTE — Telephone Encounter (Signed)
Michael Krueger Is calling [employer] in [place]. She is calling to let us know that she will be faxing in some lab readings for patient. The lab is a BM[facility] please look for the readings. The BMET was 1,358.

## 2017-05-01 NOTE — Telephone Encounter (Signed)
Pt said he had to go to the doctor on Saturday. He was instructed to call his doctor today and tell him that that he had a BNP of 13.58.

## 2017-05-01 NOTE — Telephone Encounter (Signed)
Contact pt to see if symptoms improved, weight stable, ?peripheral edema, etc Michael Krueger

## 2017-05-01 NOTE — Telephone Encounter (Signed)
Returned call to patient's primary care office. Spoke w Benjamine Mola and was also able to speak w Salome Arnt, DNP who saw patient in office last week & has seen once prior. Pt of Dr. Stanford Breed, hx CHF, [facility] saw for 2 weeks SOB, cough, and congestion in right lower lobe. She treated for pneumonia (including antibiotics & steroids), also drew BNP which was 1358. Wanted to make [place] aware of this. Also, pt has denied weight fluctuations, but peripheral edema observed at visit.  Currently on 40mg  lasix BID.  Benjamine Mola is faxing [place] encounter notes for last week's visit.  Aware I will route to Dr. Stanford Breed for recommendations.

## 2017-05-01 NOTE — Telephone Encounter (Signed)
Spoke with pt, he went to the doctor on Saturday because he could not breathe. He has pneumonia and was given amoxicillin and prednisone and was told to call us because his BNP was high. He has little edema in his feet and leg but feels his abdomen is distended. He is having SOB with any activity, he had one bad episode of orthopia but it has gotten better. His weight is stable at 175 lbs. Advised patient to take 80 mg of furosemide twice daily for the next 3 days. He will come and see the APP Thursday this week for evaluation and possible labs. Patient voiced understanding to call if he has no change prior to Thursday. Pt agreed with this plan.

## 2017-05-04 ENCOUNTER — Ambulatory Visit: Payer: Medicare FFS | Admitting: Physician Assistant

## 2017-05-04 ENCOUNTER — Encounter: Payer: Self-pay | Admitting: Physician Assistant

## 2017-05-04 VITALS — BP 118/72 | HR 70 | Ht 62.0 in | Wt 170.0 lb

## 2017-05-04 DIAGNOSIS — I5022 Chronic systolic (congestive) heart failure: Secondary | ICD-10-CM

## 2017-05-04 DIAGNOSIS — I2510 Atherosclerotic heart disease of native coronary artery without angina pectoris: Secondary | ICD-10-CM | POA: Diagnosis not present

## 2017-05-04 DIAGNOSIS — I480 Paroxysmal atrial fibrillation: Secondary | ICD-10-CM | POA: Diagnosis not present

## 2017-05-04 DIAGNOSIS — Z95810 Presence of automatic (implantable) cardiac defibrillator: Secondary | ICD-10-CM | POA: Diagnosis not present

## 2017-05-04 DIAGNOSIS — Z8679 Personal history of other diseases of the circulatory system: Secondary | ICD-10-CM | POA: Diagnosis not present

## 2017-05-04 DIAGNOSIS — Z9889 Other specified postprocedural states: Secondary | ICD-10-CM

## 2017-05-04 DIAGNOSIS — I255 Ischemic cardiomyopathy: Secondary | ICD-10-CM

## 2017-05-04 NOTE — Patient Instructions (Addendum)
Continue Lasix 80 mg twice a day   Lab work  ( bmet,bnp ) with PCP on Monday 05/08/17 Have faxed to office   Your physician recommends that you schedule a follow-up appointment with Dr.Crenshaw   Monday 08/07/17 at 11:00 am

## 2017-05-04 NOTE — Progress Notes (Signed)
Cardiology Office Note    Date:  05/06/2017   ID:  Michael Krueger, DOB 06/01/1955, MRN [id number]  PCP:  Patient, No Pcp Per  Cardiologist:  Dr. Stanford Breed  Primary electrophysiologist: Dr. Caryl Comes  Chief Complaint  Patient presents with  . Follow-up    seen in primary and pt was told his bnp was elevated and followup with heart doctor    History of Present Illness:  Michael Krueger is a 62 y.o. male with PMH of CAD, ICM s/p Medtronic BiV-ICD, chronic systolic HF with baseline EF 15%, VT s/p ablation and PAF on amiodarone.  Last cardiac catheterization performed on 11/04/2008 showed 80% stenosis in tiny OM, otherwise mild disease in LAD with patent stent after the diagonal, 40% mid left circumflex artery, 70% RCA disease followed by total occlusion.  Myoview in April 2013 showed EF 14%, large area of scarring anterior, anteroseptal, inferoseptal, inferior and anterior lateral distributions, no significant ischemia. Last echocardiogram obtained on 07/19/2016 showed EF 15%, no evidence of LV thrombus by Definity contrast, although there is a swirling of contrast in the apex consistent with slow flow state which increases the risk of developing LV thrombus, akinesis of the apical myocardium, akinesis of the mid apical anterior and lateral myocardium, akinesis of mid anteroseptal myocardium, akinesis of the entire inferoseptal myocardium and inferior myocardium, grade 2 DD, mild MR, severely dilated LAE measuring 47 mm in anterior posterior dimension.  Patient presents today for cardiology office visit.  Recently he has been having more shortness of breath.  On December 23, he woke up in the middle the night feeling smothered.  That was the only episode of PND he had recently.  He also has been noticing some lower extremity edema as well.  He was seen by his primary care provider and was prescribed amoxicillin for possible pneumonia.  However later lab work came back showed his BNP was 1358.  He  contacted the cardiology office, we instructed him to take 80 mg twice daily of Lasix for 3 days.  He presents today for cardiology office evaluation.  His lower extremity edema has resolved.  His breathing is still not at his baseline however improved compared to last week.  He is no longer having any lower extremity edema, orthopnea or PND.  On physical exam, although he has mildly diminished breath sounds in the left base, however he does not have any crackle, wheezing or rhonchi.  I am interested to see if he is able to tolerate 80 mg twice daily of Lasix, I instructed the patient to continue on the current dose of diuretic.  So far he has only had 3 days of higher dose of diuretic, he will need a lab work next Monday, if renal function is stable, I would like to keep him on this higher dose.  However if renal function worsens, I will scale him back to the previous dose. Otherwise, he denies any chest pain recently     Past Medical History:  Diagnosis Date  . Atrial fibrillation ([place])   . AV BLOCK, COMPLETE   . CAD   . Chronic systolic heart failure ([place])   . Gynecomastia   . HYPERLIPIDEMIA-MIXED   . HYPERTHYROIDISM   . Implantable cardiac defibrillator  medtronic    Initially related 1990s, upgrade to dual-chamber 2002, generator change 2005, generator change August 2010, hematoma evacuation September 2010.  . Ischemic cardiomyopathy   . Long term current use of anticoagulant   . OBESITY-MORBID (>100')   .  TRANSAMINASES, SERUM, ELEVATED   . VENTRICULAR TACHYCARDIA    S/P RFCA x3  2006, 2010,2010    Past Surgical History:  Procedure Laterality Date  . IMPLANTABLE CARDIOVERTER DEFIBRILLATOR GENERATOR CHANGE N/A 10/23/2013   Procedure: IMPLANTABLE CARDIOVERTER DEFIBRILLATOR GENERATOR CHANGE;  Surgeon: Deboraha Sprang, MD;  Location: [facility] CATH LAB;  Service: Cardiovascular;  Laterality: N/A;  . SPLENECTOMY    . Status post Medtronic Concerto CRT-D in August 2010 with pocket revision       Current Medications: Outpatient Medications Prior to Visit  Medication Sig Dispense Refill  . acetaminophen (TYLENOL) 500 MG tablet Take 1,000 mg by mouth daily as needed for headache.     [name] amiodarone (PACERONE) 200 MG tablet TAKE 1 TABLET BY MOUTH EVERY MORNING AND 1/2 TABLET IN THE EVENING 135 tablet 0  . amoxicillin-clavulanate (AUGMENTIN) 875-125 MG tablet Take 1 tablet by mouth 2 (two) times daily. Taknig for 10 days started on 04/29/2017  0  . atorvastatin (LIPITOR) 80 MG tablet TAKE 1 TABLET (80 MG TOTAL) BY MOUTH DAILY. 90 tablet 3  . carvedilol (COREG) 12.5 MG tablet TAKE 1 TABLET (12.5 MG TOTAL) BY MOUTH 2 (TWO) TIMES DAILY. 180 tablet 2  . digoxin (LANOXIN) 0.125 MG tablet TAKE 1/2 TABLET BY MOUTH DAILY 45 tablet 3  . ELIQUIS 5 MG TABS tablet TAKE 1 TABLET BY MOUTH TWICE A DAY 180 tablet 1  . furosemide (LASIX) 40 MG tablet TAKE 1 TABLET (40 MG TOTAL) BY MOUTH 2 (TWO) TIMES DAILY. 180 tablet 3  . metFORMIN (GLUCOPHAGE) 850 MG tablet Take 1,275 mg by mouth daily.     . pantoprazole (PROTONIX) 40 MG tablet Take 1 tablet by mouth Daily.    . sacubitril-valsartan (ENTRESTO) 24-26 MG Take 1 tablet by mouth 2 (two) times daily. 180 tablet 3  . traZODone (DESYREL) 50 MG tablet Take 25-100 mg by mouth daily as needed for sleep.      No facility-administered medications prior to visit.      Allergies:   Other   Social History   Socioeconomic History  . Marital status: Single    Spouse name: None  . Number of children: None  . Years of education: None  . Highest education level: None  Social Needs  . Financial resource strain: None  . Food insecurity - worry: None  . Food insecurity - inability: None  . Transportation needs - medical: None  . Transportation needs - non-medical: None  Occupational History  . None  Tobacco Use  . Smoking status: Former Research scientist (life sciences)  . Smokeless tobacco: Never Used  Substance and Sexual Activity  . Alcohol use: Yes  . Drug use: No  . Sexual  activity: None  Other Topics Concern  . None  Social History Narrative  . None     Family History:  The patient's family history includes Cancer in his mother; Heart attack in his maternal grandfather; Hypertension in his sister; Stroke in his paternal grandfather.   ROS:   Please see the history of present illness.    ROS All other systems reviewed and are negative.   PHYSICAL EXAM:   VS:  BP 118/72   Pulse 70   Ht 5\' 2"  (1.575 m)   Wt 170 lb (77.1 kg)   BMI 31.09 kg/m    GEN: Well nourished, well developed, in no acute distress  HEENT: normal  Neck: no JVD, carotid bruits, or masses Cardiac: RRR; no murmurs, rubs, or gallops,no edema  Respiratory:  clear  to auscultation bilaterally, normal work of breathing GI: soft, nontender, nondistended, + BS MS: no deformity or atrophy  Skin: warm and dry, no rash Neuro:  Alert and Oriented x 3, Strength and sensation are intact Psych: euthymic mood, full affect  Wt Readings from Last 3 Encounters:  05/04/17 170 lb (77.1 kg)  01/16/17 170 lb (77.1 kg)  07/08/16 175 lb 9.6 oz (79.7 kg)      Studies/Labs Reviewed:   EKG:  EKG is ordered today.  The ekg ordered today demonstrates AV paced rhythm, heart rate 70  Recent Labs: 01/16/2017: ALT 23; BUN 12; Creatinine, Ser 1.24; Hemoglobin 13.7; Platelets 256; Potassium 3.9; Sodium 141; TSH 2.970   Lipid Panel    Component Value Date/Time   CHOL 124 (L) 04/20/2015 1127   TRIG 162 (H) 04/20/2015 1127   HDL 26 (L) 04/20/2015 1127   CHOLHDL 4.8 04/20/2015 1127   VLDL 32 (H) 04/20/2015 1127   LDLCALC 66 04/20/2015 1127   LDLDIRECT 96.7 08/08/2011 0950    Additional studies/ records that were reviewed today include:   Cath 11/04/2008  HEMODYNAMIC DATA:  1. Central aortic pressure 103/63, mean 80.  2. Left ventricular pressure 102/16 to 18.  3. There was not a gradient on pullback across the aortic valve.   ANGIOGRAPHIC DATA:  1. The left main is free of critical disease.  2.  The LAD is fairly calcified.  There was a previously placed stent      just after the diagonal.  There is less than 30% narrowing      throughout the stent.  There are multiple areas of 30% narrowing      throughout the mid and distal vessel with calcified vessel but no      critical stenoses noted.  3. The circumflex is a fairly large-caliber vessel.  There is a tiny      marginal branch with about 80% ostial proximal narrowing, but it is      about 1.5-mm artery not covering a large territory.  Proximal to      this location is a previously placed stent with less than 30%      narrowing.  There is diffuse 40% narrowing throughout the mid      circumflex, which supplies a large circumflex territory.  There is      no critical narrowing in this area.  4. The right coronary artery is irregular with 70% narrowing after an      RV branch, then totally occluded and then there are bridging      collaterals that provide blood to the posterior descending,      posterolateral system.   Ventriculography in the RAO projection reveals a markedly enlarged  ventricle with diffuse hypokinesis.  Segmentality of this is difficult  to discern, but ejection fraction would be estimated in the 10-15%  range.  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   CONCLUSIONS:  1. Severe reduction in left ventricular function with ejection      fraction estimated at10-15% range.  2. Continued patency of the stents previously placed in the left      anterior descending and circumflex.  3. Small circumflex marginal branch with ostial proximal stenosis but      small vessel.  4. Total occlusion of the right coronary artery with bridging      collateral to the distal vessel.     Echo 07/19/2016 LV EF: 15%  Study Conclusions  - Left ventricle: Although  there is no evidence of Lv thrombus by   definity contrast, there is swirling of contrast in the apex c/w   slow flow state which increases risk of  developing LV thrombus   and longterm anticoagulation should be considered if patient is   not already on. The cavity size was severely dilated. Systolic   function was severely reduced. The estimated ejection fraction   was 15%. There is akinesis of the apical myocardium. There is   akinesis of the mid-apicalanterior and lateral myocardium. There   is akinesis of the midanteroseptal myocardium. There is akinesis   of the entireinferoseptal myocardium. There is akinesis of the   entireinferolateral and inferior myocardium. Features are   consistent with a pseudonormal left ventricular filling pattern,   with concomitant abnormal relaxation and increased filling   pressure (grade 2 diastolic dysfunction). Doppler parameters are   consistent with high ventricular filling pressure. - Aortic valve: Mildly calcified annulus. Trileaflet; normal   thickness, mildly calcified leaflets. - Mitral valve: Calcified annulus. There was mild regurgitation. - Left atrium: The atrium was severely dilated. Anterior-posterior   dimension: 47 mm. - Tricuspid valve: There was trivial regurgitation.   ASSESSMENT:    1. SYSTOLIC HEART FAILURE, CHRONIC   2. Paroxysmal atrial fibrillation (HCC)   3. Ischemic cardiomyopathy   4. Biventricular ICD (implantable cardioverter-defibrillator) in place   5. Coronary artery disease involving native coronary artery of native heart without angina pectoris   6. S/P ablation of ventricular arrhythmia      PLAN:  In order of problems listed above:  1. Chronic systolic heart failure: Lasix was recently to 80 mg twice daily for 3 days, his PND episode has not recurred, his shortness of breath is improving however not at his baseline.  I will continue on 80 mg twice daily of Lasix for now, he will need a metabolic panel next Monday, if renal function is stable, I would prefer him to stay on the current dose of Lasix.  Will need a BNP as well.  Other medications include  digoxin, carvedilol and Entresto  2. Paroxysmal atrial fibrillation: Continue digoxin, Eliquis and carvedilol.  Anticoagulated with Eliquis 5 mg twice daily. CHA2DS2-Vasc score 2 (CAD, HF)  3. ICM s/p BiV ICD: Device managed by Dr. Caryl Comes  4. CAD: Denies any recent chest pain  5. History of VT s/p ablation: No recent dizziness or significant recurrence.  ICD in place.   6. HLD: Monitored by primary care provider. on Lipitor 80 mg daily    Medication Adjustments/Labs and Tests Ordered: Current medicines are reviewed at length with the patient today.  Concerns regarding medicines are outlined above.  Medication changes, Labs and Tests ordered today are listed in the Patient Instructions below. Patient Instructions  Continue Lasix 80 mg twice a day   Lab work  ( bmet,bnp ) with PCP on Monday 05/08/17 Have faxed to office   Your physician recommends that you schedule a follow-up appointment with Dr.Crenshaw   Monday 08/07/17 at 11:00 am    Signed, Almyra Deforest, [place]  05/06/2017 8:42 AM    [address], [place], [place]  [address] Phone: [phone]; Fax: [phone]

## 2017-05-06 ENCOUNTER — Encounter: Payer: Self-pay | Admitting: Physician Assistant

## 2017-05-08 ENCOUNTER — Telehealth: Payer: Self-pay | Admitting: Cardiology

## 2017-05-08 NOTE — Telephone Encounter (Signed)
Diane from the lab stated that she needs the lab orders that had been faxed previously to be signed by Almyra Deforest, [place]. They have been signed and faxed.

## 2017-05-08 NOTE — Telephone Encounter (Signed)
New Message   Michael Krueger is calling from lab. They received lab paper work that is not signed by Almyra Deforest. They need the paper work to be signed and refax.

## 2017-05-16 ENCOUNTER — Telehealth: Payer: Self-pay | Admitting: Cardiology

## 2017-05-16 DIAGNOSIS — Z79899 Other long term (current) drug therapy: Secondary | ICD-10-CM

## 2017-05-16 MED ORDER — POTASSIUM CHLORIDE CRYS ER 20 MEQ PO TBCR: 20.0000 meq | EXTENDED_RELEASE_TABLET | Freq: Every day | ORAL | 5 refills | Status: DC

## 2017-05-16 NOTE — Telephone Encounter (Signed)
New Message   Patient is wanting to know if his lab results  from [facility] has been received. Please call.

## 2017-05-16 NOTE — Telephone Encounter (Signed)
F/u Message  Pt returning RN call

## 2017-05-16 NOTE — Telephone Encounter (Signed)
SPOKE TO PATIENT . INFORMED LAB WORK IN QUESTION  HAS ARRIVED ,AWIATING IN COMMENT   LABS ARE FROM 05/08/17  ( SCANNED) . WILL DEFER TO DR CRENSHAW TO REVIEW PATIENT VERBALIZED UNDERSTANDING

## 2017-05-16 NOTE — Telephone Encounter (Signed)
Add kcl 20 meq daily; bmet one week Kirk Ruths

## 2017-05-16 NOTE — Telephone Encounter (Signed)
Left message for pt to call.

## 2017-05-16 NOTE — Telephone Encounter (Signed)
Patient aware of med changes/recommendations per MD. Rx(s) sent to pharmacy electronically. Patient will have labs at [facility] in [facility] (address, contact info provided)

## 2017-05-23 ENCOUNTER — Telehealth: Payer: Self-pay | Admitting: Cardiology

## 2017-05-23 NOTE — Telephone Encounter (Signed)
Returned call to patient. Patient reports he is suppose to have labs redrawn this week at [facility] in [place].  Patient states he was suppose to get lab orders in mail.   Advised I am unsure if these were mailed out but [facility] should be able to access the orders in the system.   Advised if unable to access when he goes, to call office while he is there to get order faxed.   Patient aware and verbalized understanding.

## 2017-05-23 NOTE — Telephone Encounter (Signed)
Patient calling in regards to lab orders. Patient would like the lab orders mailed to his address.

## 2017-05-25 ENCOUNTER — Telehealth: Payer: Self-pay | Admitting: Internal Medicine

## 2017-05-25 ENCOUNTER — Telehealth: Payer: Self-pay | Admitting: Cardiology

## 2017-05-25 NOTE — Telephone Encounter (Signed)
Spoke with pt Lab slip was faxed to [facility] in [place]

## 2017-05-25 NOTE — Telephone Encounter (Signed)
Returned call to patient no answer.LMTC. 

## 2017-05-25 NOTE — Telephone Encounter (Signed)
Patient calling, states that he went to [facility] to have labwork completed. [facility] does not have orders

## 2017-05-25 NOTE — Telephone Encounter (Signed)
Call and spoke with pt, he stated he went to [facility] in [place] and was told there was no order in the system, called to [facility] in [place] and get fax# Lab slip was faxed to [facility] in [place] at [phone] and pt made aware.

## 2017-05-25 NOTE — Telephone Encounter (Signed)
New message  Pt verbalized that he is calling for the RN  For lab rersults

## 2017-05-26 ENCOUNTER — Telehealth: Payer: Self-pay | Admitting: Cardiology

## 2017-05-26 NOTE — Telephone Encounter (Signed)
Patient called and stated that he had pneumonia about a month ago and he still doesn't feel well. He wonders if it could be related to his ICD b/c he can feel his heart pounding sometimes. Instructed pt to send a remote transmission w/ his home monitor and a Device Tech RN will review and call him back. Pt verbalized understanding.

## 2017-05-26 NOTE — Telephone Encounter (Signed)
Called pt and informed him that transmission was normal, pt voiced understanding.

## 2017-05-27 LAB — BASIC METABOLIC PANEL
BUN/Creatinine Ratio: 10 (ref 10–24)
BUN: 14 mg/dL (ref 8–27)
CALCIUM: 9.3 mg/dL (ref 8.6–10.2)
CHLORIDE: 98 mmol/L (ref 96–106)
CO2: 26 mmol/L (ref 20–29)
Creatinine, Ser: 1.34 mg/dL — ABNORMAL HIGH (ref 0.76–1.27)
GFR, EST AFRICAN AMERICAN: 66 mL/min/{1.73_m2} (ref >59)
GFR, EST NON AFRICAN AMERICAN: 57 mL/min/{1.73_m2} — AB (ref >59)
Glucose: 155 mg/dL — ABNORMAL HIGH (ref 65–99)
POTASSIUM: 4.5 mmol/L (ref 3.5–5.2)
Sodium: 141 mmol/L (ref 134–144)

## 2017-05-29 ENCOUNTER — Other Ambulatory Visit: Payer: Self-pay | Admitting: Cardiology

## 2017-05-29 NOTE — Telephone Encounter (Signed)
Rx request sent to pharmacy.  

## 2017-06-02 ENCOUNTER — Telehealth: Payer: Self-pay | Admitting: Cardiology

## 2017-06-02 MED ORDER — FUROSEMIDE 80 MG PO TABS: 80.0000 mg | ORAL_TABLET | Freq: Two times a day (BID) | ORAL | 1 refills | Status: DC

## 2017-06-02 NOTE — Telephone Encounter (Signed)
New Message    *STAT* If patient is at the pharmacy, call can be transferred to refill team.   1. Which medications need to be refilled? (please list name of each medication and dose if known) furosemide (LASIX) 40 MG tabletfurosemide (LASIX) 40 MG tablet  2. Which pharmacy/location (including street and city if local pharmacy) is medication to be sent to? [facility]   3. Do they need a 30 day or 90 day supply? 30  Patient states that the pharmacy does not have the refill request.

## 2017-06-02 NOTE — Telephone Encounter (Signed)
[facility]  he states that this is too soon to refill. He cannot fill until 06-06-17. I just noticed that his med list was not updated at last visit, I will update and send a refill.  Rx sent to requested pharmacy

## 2017-07-01 ENCOUNTER — Other Ambulatory Visit: Payer: Self-pay | Admitting: Cardiology

## 2017-07-01 DIAGNOSIS — I4891 Unspecified atrial fibrillation: Secondary | ICD-10-CM

## 2017-07-05 ENCOUNTER — Encounter: Payer: Self-pay | Admitting: Internal Medicine

## 2017-07-10 ENCOUNTER — Ambulatory Visit (INDEPENDENT_AMBULATORY_CARE_PROVIDER_SITE_OTHER): Payer: Medicare FFS | Admitting: *Deleted

## 2017-07-10 DIAGNOSIS — I255 Ischemic cardiomyopathy: Secondary | ICD-10-CM | POA: Diagnosis not present

## 2017-07-10 NOTE — Progress Notes (Signed)
Remote ICD transmission.   

## 2017-07-12 ENCOUNTER — Encounter: Payer: Self-pay | Admitting: Cardiology

## 2017-07-13 ENCOUNTER — Encounter: Payer: Self-pay | Admitting: Internal Medicine

## 2017-07-13 ENCOUNTER — Ambulatory Visit: Payer: Medicare FFS | Admitting: Internal Medicine

## 2017-07-13 VITALS — BP 116/72 | HR 82 | Ht 62.0 in | Wt 164.0 lb

## 2017-07-13 DIAGNOSIS — I255 Ischemic cardiomyopathy: Secondary | ICD-10-CM | POA: Diagnosis not present

## 2017-07-13 DIAGNOSIS — Z95810 Presence of automatic (implantable) cardiac defibrillator: Secondary | ICD-10-CM | POA: Diagnosis not present

## 2017-07-13 DIAGNOSIS — I472 Ventricular tachycardia: Secondary | ICD-10-CM

## 2017-07-13 DIAGNOSIS — Z79899 Other long term (current) drug therapy: Secondary | ICD-10-CM

## 2017-07-13 DIAGNOSIS — I4729 Other ventricular tachycardia: Secondary | ICD-10-CM

## 2017-07-13 DIAGNOSIS — I5022 Chronic systolic (congestive) heart failure: Secondary | ICD-10-CM | POA: Diagnosis not present

## 2017-07-13 LAB — CUP PACEART REMOTE DEVICE CHECK
Battery Remaining Longevity: 28 mo
Battery Voltage: 2.95 V
Brady Statistic AP VP Percent: 85.96 %
Brady Statistic AS VS Percent: 0.01 %
HIGH POWER IMPEDANCE MEASURED VALUE: 50 Ohm
HighPow Impedance: 42 Ohm
Implantable Lead Implant Date: 20000816
Implantable Lead Implant Date: 20100818
Implantable Lead Location: 753860
Implantable Lead Model: 5076
Implantable Lead Model: 6945
Implantable Pulse Generator Implant Date: 20150624
Lead Channel Impedance Value: 304 Ohm
Lead Channel Impedance Value: 323 Ohm
Lead Channel Impedance Value: 399 Ohm
Lead Channel Impedance Value: 456 Ohm
Lead Channel Pacing Threshold Pulse Width: 0.4 ms
Lead Channel Sensing Intrinsic Amplitude: 1.25 mV
Lead Channel Setting Pacing Amplitude: 2 V
Lead Channel Setting Pacing Amplitude: 2.5 V
Lead Channel Setting Pacing Pulse Width: 0.4 ms
Lead Channel Setting Sensing Sensitivity: 0.3 mV
MDC IDC LEAD IMPLANT DT: 20000626
MDC IDC LEAD IMPLANT DT: 20100818
MDC IDC LEAD LOCATION: 753858
MDC IDC LEAD LOCATION: 753859
MDC IDC LEAD LOCATION: 753860
MDC IDC MSMT LEADCHNL LV IMPEDANCE VALUE: 380 Ohm
MDC IDC MSMT LEADCHNL LV IMPEDANCE VALUE: 646 Ohm
MDC IDC MSMT LEADCHNL RA SENSING INTR AMPL: 1.25 mV
MDC IDC MSMT LEADCHNL RV PACING THRESHOLD AMPLITUDE: 1.25 V
MDC IDC SESS DTM: 20190311083723
MDC IDC SET LEADCHNL LV PACING AMPLITUDE: 2.5 V
MDC IDC SET LEADCHNL RV PACING PULSEWIDTH: 0.4 ms
MDC IDC STAT BRADY AP VS PERCENT: 0 %
MDC IDC STAT BRADY AS VP PERCENT: 14.03 %
MDC IDC STAT BRADY RA PERCENT PACED: 82.68 %
MDC IDC STAT BRADY RV PERCENT PACED: 99.9 %

## 2017-07-13 NOTE — Progress Notes (Signed)
Patient Care Team: Patient, No Pcp Per as PCP - General (General Practice) Lelon Perla, MD as PCP - Cardiology (Cardiology)   HPI  Michael Krueger is a 62 y.o. male Seen in followup for VT with ICD-CRT implantation   for congestive heart failure in the setting of ischemic heart cardiomyopathy  He has a history of ventricular tachycardia status post ablation. He also has a history of atrial fibrillation and he takes amiodarone for the combination of the 2. . He underwent later generator replacement 10/23/13   Last cath performed in July of 2010 and revealed an occluded RCA but nonobstructive disease in the Lcx and LAD other than ostial 80 in a small marginal;    DATE TEST EF   6/15 Echo   15 %   3/18 Echo   15 % LAE severe        Is  Date Cr K Hgb TSH LFTs Dig  12/16     2.53 20    3/18  1.23 4.9 14.2 4.27 18 0.5  9/18 1.24 3.9 2.97 2.97 23   1/19 1.34 4.5        His PCP said he had pneumonia in December.  He got short of breath got better and then is gotten worse again.  He has had a cough intermittently productive.  He has had no edema and no orthopnea.  He had no chest discomfort.  Past Medical History:  Diagnosis Date  . Atrial fibrillation ([place])   . AV BLOCK, COMPLETE   . CAD   . Chronic systolic heart failure ([place])   . Gynecomastia   . HYPERLIPIDEMIA-MIXED   . HYPERTHYROIDISM   . Implantable cardiac defibrillator  medtronic    Initially related 1990s, upgrade to dual-chamber 2002, generator change 2005, generator change August 2010, hematoma evacuation September 2010.  . Ischemic cardiomyopathy   . Long term current use of anticoagulant   . OBESITY-MORBID (>100')   . TRANSAMINASES, SERUM, ELEVATED   . VENTRICULAR TACHYCARDIA    S/P RFCA x3  2006, 2010,2010    Past Surgical History:  Procedure Laterality Date  . IMPLANTABLE CARDIOVERTER DEFIBRILLATOR GENERATOR CHANGE N/A 10/23/2013   Procedure: IMPLANTABLE CARDIOVERTER DEFIBRILLATOR GENERATOR  CHANGE;  Surgeon: Deboraha Sprang, MD;  Location: [facility] CATH LAB;  Service: Cardiovascular;  Laterality: N/A;  . SPLENECTOMY    . Status post Medtronic Concerto CRT-D in August 2010 with pocket revision      Current Outpatient Medications  Medication Sig Dispense Refill  . acetaminophen (TYLENOL) 500 MG tablet Take 1,000 mg by mouth daily as needed for headache.     [name] amiodarone (PACERONE) 200 MG tablet Take 200 mg by mouth daily.    [name] atorvastatin (LIPITOR) 80 MG tablet TAKE 1 TABLET (80 MG TOTAL) BY MOUTH DAILY. 90 tablet 3  . carvedilol (COREG) 12.5 MG tablet TAKE 1 TABLET (12.5 MG TOTAL) BY MOUTH 2 (TWO) TIMES DAILY. 180 tablet 2  . digoxin (LANOXIN) 0.125 MG tablet TAKE 1/2 TABLET BY MOUTH DAILY 45 tablet 3  . ELIQUIS 5 MG TABS tablet TAKE 1 TABLET BY MOUTH TWICE A DAY 180 tablet 1  . furosemide (LASIX) 80 MG tablet Take 1 tablet (80 mg total) by mouth 2 (two) times daily. 180 tablet 1  . metFORMIN (GLUCOPHAGE) 850 MG tablet Take 1,275 mg by mouth daily.     . pantoprazole (PROTONIX) 40 MG tablet Take 1 tablet by mouth Daily.    . potassium chloride  SA (K-DUR,KLOR-CON) 20 MEQ tablet Take 1 tablet (20 mEq total) by mouth daily. 30 tablet 5  . sacubitril-valsartan (ENTRESTO) 24-26 MG Take 1 tablet by mouth 2 (two) times daily. 180 tablet 3  . traZODone (DESYREL) 50 MG tablet Take 25-100 mg by mouth daily as needed for sleep.      No current facility-administered medications for this visit.     Allergies  Allergen Reactions  . Other Itching and Other (See Comments)    CHG wipes/  Caused a rash    Review of Systems negative except from HPI and PMH  Physical Exam BP 116/72   Pulse 82   Ht 5\' 2"  (1.575 m)   Wt 164 lb (74.4 kg)   SpO2 95%   BMI 30.00 kg/m  Well developed and nourished in no acute distress HENT normal Neck supple with JVP-flat Clear Regular rate and rhythm, no murmurs or gallops Abd-soft with active BS No Clubbing cyanosis edema Skin-warm and dry A & Oriented   Grossly normal sensory and motor function   ECG demonstrates AV pacing with an upright QRS in lead V1 and negative QRS in lead I and a QRS duration of  154 ms  Assessment and  Plan  Congestive heart failure-chronic-systolic  Ventricular tachycardia  Implantable defibrillator  Ischemic cardiomyopathy  Shortness of breath No intercurrent Ventricular tachycardia  No intercurrent atrial fibrillation or flutter  Euvolemic continue current meds  With the shortness of breath, the differential diagnosis includes heart failure, so we will check a BNP.    There is no evidence of right-sided volume overload.  Symptom wise he does not have significant left-sided overload, but. 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  We will check pulmonary function testing with DLCO.  On Anticoagulation;  No bleeding issues     .We spent more than 50% of our >25 min visit in face to face counseling regarding the above

## 2017-07-13 NOTE — Patient Instructions (Addendum)
Medication Instructions:  Your physician recommends that you continue on your current medications as directed. Please refer to the Current Medication list given to you today.  Labwork: Your physician recommends that you have lab work today: CBC, LFTs, TSH, and Digoxin level  Testing/Procedures: Pulmonary Function Test   Pulmonary Function Tests Pulmonary function tests (PFTs) are used to measure how well your lungs work, find out what is causing your lung problems, and figure out the best treatment for you. You may have PFTs:  When you have an illness involving the lungs.  To follow changes in your lung function over time if you have a chronic lung disease.  If you are an Nature conservation officer. This checks the effects of being exposed to chemicals over a long period of time.  To check lung function before having surgery or other procedures.  To check your lungs if you smoke.  To check if prescribed medicines or treatments are helping your lungs.  Your results will be compared to the expected lung function of someone with healthy lungs who is similar to you in:  Age.  Gender.  Height.  Weight.  Race or ethnicity.  This is done to show how your lungs compare to normal lung function (percent predicted). This is how your health care provider knows if your lung function is normal or not. If you have had PFTs done before, your health care provider will compare your current results with past results. This shows if your lung function is better, worse, or the same as before. Tell a health care provider about:  Any allergies you have.  All medicines you are taking, including inhaler or nebulizer medicines, vitamins, herbs, eye drops, creams, and over-the-counter medicines.  Any blood disorders you have.  Any surgeries you have had, especially recent eye surgery, abdominal surgery, or chest surgery. These can make PFTs difficult or unsafe.  Any medical conditions you have,  including chest pain or heart problems, tuberculosis, or respiratory infections such as pneumonia, a cold, or the flu.  Any fear of being in closed spaces (claustrophobia). Some of your tests may be in a closed space. What are the risks? Generally, this is a safe procedure. However, problems may occur, including:  Light-headedness due to over-breathing (hyperventilation).  An asthma attack from deep breathing.  A collapsed lung.  What happens before the procedure?  Take over-the-counter and prescription medicines only as told by your health care provider. If you take inhaler or nebulizer medicines, ask your health care provider which medicines you should take on the day of your testing. Some inhaler medicines may interfere with PFTs if they are taken shortly before the tests.  Follow your health care provider's instructions on eating and drinking restrictions. This may include avoiding eating large meals and drinking alcohol before the testing.  Do not use any products that contain nicotine or tobacco, such as cigarettes and e-cigarettes. If you need help quitting, ask your health care provider.  Wear comfortable clothing that will not interfere with breathing. What happens during the procedure?  You will be given a soft nose clip to wear. This is done so all of your breaths will go through your mouth instead of your nose.  You will be given a germ-free (sterile) mouthpiece. It will be attached to a machine that measures your breathing (spirometer).  You will be asked to do various breathing maneuvers. The maneuvers will be done by breathing in (inhaling) and breathing out (exhaling). You may be asked to  repeat the maneuvers several times before the testing is done.  It is important to follow the instructions exactly to get accurate results. Make sure to blow as hard and as fast as you can when you are told to do so.  You may be given a medicine that makes the small air passages in  your lungs larger (bronchodilator) after testing has been done. This medicine will make it easier for you to breathe.  The tests will be repeated after the bronchodilator has taken effect.  You will be monitored carefully during the procedure for faintness, dizziness, trouble breathing, or any other problems. The procedure may vary among health care providers and hospitals. What happens after the procedure?  It is up to you to get your test results. Ask your health care provider, or the department that is doing the tests, when your results will be ready. After you have received your test results, talk with your health care provider about treatment options, if necessary. Summary  Pulmonary function tests (PFTs) are used to measure how well your lungs work, find out what is causing your lung problems, and figure out the best treatment for you.  Wear comfortable clothing that will not interfere with breathing.  It is up to you to get your test results. After you have received them, talk with your health care provider about treatment options, if necessary. This information is not intended to replace advice given to you by your health care provider. Make sure you discuss any questions you have with your health care provider. Document Released: 12/10/2003 Document Revised: 03/10/2016 Document Reviewed: 03/10/2016 Elsevier Interactive Patient Education  2018 [place]: Your physician recommends that you schedule a follow-up appointment in:  6 months to see Chanetta Benney, PA  Remote monitoring is used to monitor your Pacemaker of ICD from home. This monitoring reduces the number of office visits required to check your device to one time per year. It allows [place] to keep an eye on the functioning of your device to ensure it is working properly. You are scheduled for a device check from home on 10/09/2017. You may send your transmission at any time that day. If you have a wireless device,  the transmission will be sent automatically. After your physician reviews your transmission, you will receive a postcard with your next transmission date.   Any Other Special Instructions Will Be Listed Below (If Applicable).     If you need a refill on your cardiac medications before your next appointment, please call your pharmacy.

## 2017-07-14 ENCOUNTER — Telehealth: Payer: Self-pay

## 2017-07-14 LAB — CUP PACEART INCLINIC DEVICE CHECK
Brady Statistic AP VP Percent: 86.37 %
Brady Statistic AS VP Percent: 13.6 %
Brady Statistic AS VS Percent: 0.01 %
Brady Statistic RA Percent Paced: 85.68 %
HIGH POWER IMPEDANCE MEASURED VALUE: 44 Ohm
HIGH POWER IMPEDANCE MEASURED VALUE: 56 Ohm
Implantable Lead Implant Date: 20000626
Implantable Lead Implant Date: 20000816
Implantable Lead Implant Date: 20100818
Implantable Lead Location: 753858
Implantable Lead Location: 753860
Implantable Lead Location: 753860
Implantable Lead Model: 6940
Implantable Pulse Generator Implant Date: 20150624
Lead Channel Impedance Value: 342 Ohm
Lead Channel Impedance Value: 399 Ohm
Lead Channel Impedance Value: 456 Ohm
Lead Channel Impedance Value: 665 Ohm
Lead Channel Pacing Threshold Amplitude: 1.5 V
Lead Channel Pacing Threshold Pulse Width: 0.4 ms
Lead Channel Pacing Threshold Pulse Width: 0.8 ms
Lead Channel Setting Sensing Sensitivity: 0.3 mV
MDC IDC LEAD IMPLANT DT: 20100818
MDC IDC LEAD LOCATION: 753859
MDC IDC MSMT BATTERY REMAINING LONGEVITY: 28 mo
MDC IDC MSMT BATTERY VOLTAGE: 2.92 V
MDC IDC MSMT LEADCHNL LV IMPEDANCE VALUE: 380 Ohm
MDC IDC MSMT LEADCHNL RA PACING THRESHOLD AMPLITUDE: 1.5 V
MDC IDC MSMT LEADCHNL RV IMPEDANCE VALUE: 323 Ohm
MDC IDC MSMT LEADCHNL RV PACING THRESHOLD AMPLITUDE: 1.25 V
MDC IDC MSMT LEADCHNL RV PACING THRESHOLD PULSEWIDTH: 0.4 ms
MDC IDC SESS DTM: 20190314194842
MDC IDC SET LEADCHNL LV PACING AMPLITUDE: 2.5 V
MDC IDC SET LEADCHNL LV PACING PULSEWIDTH: 0.4 ms
MDC IDC SET LEADCHNL RA PACING AMPLITUDE: 2.5 V
MDC IDC SET LEADCHNL RV PACING AMPLITUDE: 2.75 V
MDC IDC SET LEADCHNL RV PACING PULSEWIDTH: 0.4 ms
MDC IDC STAT BRADY AP VS PERCENT: 0.01 %
MDC IDC STAT BRADY RV PERCENT PACED: 99.88 %

## 2017-07-14 LAB — CBC WITH DIFFERENTIAL/PLATELET
BASOS ABS: 0.1 10*3/uL (ref 0.0–0.2)
Basos: 1 %
EOS (ABSOLUTE): 0.1 10*3/uL (ref 0.0–0.4)
EOS: 1 %
HEMATOCRIT: 39.4 % (ref 37.5–51.0)
Hemoglobin: 13.4 g/dL (ref 13.0–17.7)
IMMATURE GRANULOCYTES: 0 %
Immature Grans (Abs): 0 10*3/uL (ref 0.0–0.1)
Lymphocytes Absolute: 2.9 10*3/uL (ref 0.7–3.1)
Lymphs: 33 %
MCH: 30.2 pg (ref 26.6–33.0)
MCHC: 34 g/dL (ref 31.5–35.7)
MCV: 89 fL (ref 79–97)
MONOS ABS: 1 10*3/uL — AB (ref 0.1–0.9)
Monocytes: 11 %
NEUTROS PCT: 54 %
Neutrophils Absolute: 4.8 10*3/uL (ref 1.4–7.0)
PLATELETS: 237 10*3/uL (ref 150–379)
RBC: 4.44 x10E6/uL (ref 4.14–5.80)
RDW: 17.9 % — AB (ref 12.3–15.4)
WBC: 8.8 10*3/uL (ref 3.4–10.8)

## 2017-07-14 LAB — COMPREHENSIVE METABOLIC PANEL
A/G RATIO: 1.3 (ref 1.2–2.2)
ALT: 24 IU/L (ref 0–44)
AST: 29 IU/L (ref 0–40)
Albumin: 4 g/dL (ref 3.6–4.8)
Alkaline Phosphatase: 110 IU/L (ref 39–117)
BUN/Creatinine Ratio: 10 (ref 10–24)
BUN: 15 mg/dL (ref 8–27)
Bilirubin Total: 0.3 mg/dL (ref 0.0–1.2)
CALCIUM: 9.1 mg/dL (ref 8.6–10.2)
CO2: 24 mmol/L (ref 20–29)
CREATININE: 1.46 mg/dL — AB (ref 0.76–1.27)
Chloride: 101 mmol/L (ref 96–106)
GFR calc Af Amer: 59 mL/min/{1.73_m2} — ABNORMAL LOW (ref >59)
GFR, EST NON AFRICAN AMERICAN: 51 mL/min/{1.73_m2} — AB (ref >59)
Globulin, Total: 3 g/dL (ref 1.5–4.5)
Glucose: 113 mg/dL — ABNORMAL HIGH (ref 65–99)
POTASSIUM: 4.2 mmol/L (ref 3.5–5.2)
Sodium: 141 mmol/L (ref 134–144)
Total Protein: 7 g/dL (ref 6.0–8.5)

## 2017-07-14 LAB — DIGOXIN LEVEL: DIGOXIN, SERUM: 0.6 ng/mL (ref 0.5–0.9)

## 2017-07-14 LAB — TSH: TSH: 2.61 u[IU]/mL (ref 0.450–4.500)

## 2017-07-14 NOTE — Telephone Encounter (Signed)
-----   Message from Deboraha Sprang, MD sent at 07/14/2017  8:34 AM EDT ----- Please Inform Patient that labs are normal x modest worsening of renal function  This can be recehecked when he sees Dr London Sheer next month  Thanks

## 2017-07-17 ENCOUNTER — Ambulatory Visit (INDEPENDENT_AMBULATORY_CARE_PROVIDER_SITE_OTHER): Payer: Medicare FFS | Admitting: Internal Medicine

## 2017-07-17 DIAGNOSIS — Z79899 Other long term (current) drug therapy: Secondary | ICD-10-CM | POA: Diagnosis not present

## 2017-07-17 LAB — PULMONARY FUNCTION TEST
DL/VA % pred: 94 %
DL/VA: 3.83 ml/min/mmHg/L
DLCO COR: 16.37 ml/min/mmHg
DLCO UNC % PRED: 70 %
DLCO cor % pred: 73 %
DLCO unc: 15.79 ml/min/mmHg
FEF 25-75 POST: 1.09 L/s
FEF 25-75 Pre: 1.03 L/sec
FEF2575-%Change-Post: 6 %
FEF2575-%PRED-PRE: 46 %
FEF2575-%Pred-Post: 48 %
FEV1-%Change-Post: 0 %
FEV1-%PRED-POST: 64 %
FEV1-%PRED-PRE: 65 %
FEV1-POST: 1.71 L
FEV1-Pre: 1.73 L
FEV1FVC-%CHANGE-POST: -3 %
FEV1FVC-%PRED-PRE: 93 %
FEV6-%Change-Post: 3 %
FEV6-%PRED-PRE: 73 %
FEV6-%Pred-Post: 75 %
FEV6-POST: 2.52 L
FEV6-Pre: 2.44 L
FEV6FVC-%Change-Post: 0 %
FEV6FVC-%PRED-POST: 106 %
FEV6FVC-%Pred-Pre: 105 %
FVC-%Change-Post: 2 %
FVC-%PRED-PRE: 69 %
FVC-%Pred-Post: 71 %
FVC-PRE: 2.45 L
FVC-Post: 2.52 L
POST FEV6/FVC RATIO: 100 %
PRE FEV1/FVC RATIO: 70 %
Post FEV1/FVC ratio: 68 %
Pre FEV6/FVC Ratio: 99 %
RV % pred: 155 %
RV: 2.89 L
TLC % PRED: 101 %
TLC: 5.57 L

## 2017-07-17 NOTE — Progress Notes (Signed)
Patient completed full PFT today. 

## 2017-07-25 NOTE — Progress Notes (Signed)
HPI: FU CAD, ischemic cardiomyopathy, systolic HF, status post BiV-ICD, V. tach status post ablation, atrial fibrillation on amiodarone.   Since last seen,  he has mild dyspnea on exertion but no orthopnea, PND, pedal edema, chest pain or syncope.  He apparently has been diagnosed with masses in his chest that sound to be malignancy.  No other details available.     Studies/Reports Reviewed Today:  Echo 3/18 - EF 15, grade 2 DD, mild MR, severe LAE  Myoview 08/2011 Large area of scar in the anterior, anteroseptal, inferoseptal, inferior, anterolateral and apical distributions.  No signif ischemia. LV Ejection Fraction: 14%. LV Wall Motion: Severe diffuse hypokinesis, inferior, apical akinesis.  Cardiac catheterization 10/2008 LM: Okay LAD: Stent patent with 30% ISR, mid and distal 30% LCx: Tiny marginal branch 80% proximal, proximal to this stent patent with 30% ISR, mid CFX 40% RCA: 70% after RV branch then occluded (bridging collaterals) EF 10-15%  Current Outpatient Medications  Medication Sig Dispense Refill  . acetaminophen (TYLENOL) 500 MG tablet Take 1,000 mg by mouth daily as needed for headache.     [name] amiodarone (PACERONE) 200 MG tablet Take 200 mg by mouth daily.    [name] atorvastatin (LIPITOR) 80 MG tablet TAKE 1 TABLET (80 MG TOTAL) BY MOUTH DAILY. 90 tablet 3  . carvedilol (COREG) 12.5 MG tablet TAKE 1 TABLET (12.5 MG TOTAL) BY MOUTH 2 (TWO) TIMES DAILY. 180 tablet 2  . digoxin (LANOXIN) 0.125 MG tablet TAKE 1/2 TABLET BY MOUTH DAILY 45 tablet 3  . ELIQUIS 5 MG TABS tablet TAKE 1 TABLET BY MOUTH TWICE A DAY 180 tablet 1  . furosemide (LASIX) 80 MG tablet Take 1 tablet (80 mg total) by mouth 2 (two) times daily. 180 tablet 1  . metFORMIN (GLUCOPHAGE) 850 MG tablet Take 1,275 mg by mouth daily.     . pantoprazole (PROTONIX) 40 MG tablet Take 1 tablet by mouth Daily.    . potassium chloride SA (K-DUR,KLOR-CON) 20 MEQ tablet Take 1 tablet (20 mEq total) by  mouth daily. 30 tablet 5  . sacubitril-valsartan (ENTRESTO) 24-26 MG Take 1 tablet by mouth 2 (two) times daily. 180 tablet 3  . traZODone (DESYREL) 50 MG tablet Take 25-100 mg by mouth daily as needed for sleep.      No current facility-administered medications for this visit.      Past Medical History:  Diagnosis Date  . Atrial fibrillation ([place])   . AV BLOCK, COMPLETE   . CAD   . Chronic systolic heart failure ([place])   . Gynecomastia   . HYPERLIPIDEMIA-MIXED   . HYPERTHYROIDISM   . Implantable cardiac defibrillator  medtronic    Initially related 1990s, upgrade to dual-chamber 2002, generator change 2005, generator change August 2010, hematoma evacuation September 2010.  . Ischemic cardiomyopathy   . Long term current use of anticoagulant   . OBESITY-MORBID (>100')   . TRANSAMINASES, SERUM, ELEVATED   . VENTRICULAR TACHYCARDIA    S/P RFCA x3  2006, 2010,2010    Past Surgical History:  Procedure Laterality Date  . IMPLANTABLE CARDIOVERTER DEFIBRILLATOR GENERATOR CHANGE N/A 10/23/2013   Procedure: IMPLANTABLE CARDIOVERTER DEFIBRILLATOR GENERATOR CHANGE;  Surgeon: Deboraha Sprang, MD;  Location: [facility] CATH LAB;  Service: Cardiovascular;  Laterality: N/A;  . SPLENECTOMY    . Status post Medtronic Concerto CRT-D in August 2010 with pocket revision      Social History   Socioeconomic History  . Marital status: Single    Spouse  name: Not on file  . Number of children: Not on file  . Years of education: Not on file  . Highest education level: Not on file  Occupational History  . Not on file  Social Needs  . Financial resource strain: Not on file  . Food insecurity:    Worry: Not on file    Inability: Not on file  . Transportation needs:    Medical: Not on file    Non-medical: Not on file  Tobacco Use  . Smoking status: Former Research scientist (life sciences)  . Smokeless tobacco: Never Used  Substance and Sexual Activity  . Alcohol use: Yes  . Drug use: No  . Sexual activity: Not on file    Lifestyle  . Physical activity:    Days per week: Not on file    Minutes per session: Not on file  . Stress: Not on file  Relationships  . Social connections:    Talks on phone: Not on file    Gets together: Not on file    Attends religious service: Not on file    Active member of club or organization: Not on file    Attends meetings of clubs or organizations: Not on file    Relationship status: Not on file  . Intimate partner violence:    Fear of current or ex partner: Not on file    Emotionally abused: Not on file    Physically abused: Not on file    Forced sexual activity: Not on file  Other Topics Concern  . Not on file  Social History Narrative  . Not on file    Family History  Problem Relation Age of Onset  . Cancer Mother   . Heart attack Maternal Grandfather   . Stroke Paternal Grandfather   . Hypertension Sister     ROS: no fevers or chills, productive cough, hemoptysis, dysphasia, odynophagia, melena, hematochezia, dysuria, hematuria, rash, seizure activity, orthopnea, PND, pedal edema, claudication. Remaining systems are negative.  Physical Exam: Well-developed well-nourished in no acute distress.  Skin is warm and dry.  HEENT is normal.  Neck is supple.  Chest is clear to auscultation with normal expansion.  Cardiovascular exam is regular rate and rhythm.  Abdominal exam nontender or distended. No masses palpated. Extremities show no edema. neuro grossly intact  A/P  1 chronic systolic congestive heart failure-patient is euvolemic today.  We will continue with present dose of diuretics.  We discussed the importance of low-sodium diet and fluid restriction.  2 paroxysmal atrial fibrillation-continue amiodarone.  Patient remains in sinus rhythm.  Continue apixaban.  Patient had recent laboratories.  He apparently had a chest CT in [place] and is being evaluated for chest masses.  3 coronary artery disease-continue statin.  Patient is not on aspirin  given need for anticoagulation.  4 ischemic cardia myopathy-continue Entresto and beta-blocker.  5 prior ICD-patient is followed by electrophysiology.  6 ventricular tachycardia-continue amiodarone.  7 hyperlipidemia-continue statin.    Kirk Ruths, MD

## 2017-08-07 ENCOUNTER — Ambulatory Visit: Payer: Medicare FFS | Admitting: Cardiology

## 2017-08-07 ENCOUNTER — Encounter: Payer: Self-pay | Admitting: Cardiology

## 2017-08-07 VITALS — BP 102/62 | HR 83 | Ht 62.0 in | Wt 163.8 lb

## 2017-08-07 DIAGNOSIS — I2510 Atherosclerotic heart disease of native coronary artery without angina pectoris: Secondary | ICD-10-CM | POA: Diagnosis not present

## 2017-08-07 DIAGNOSIS — I5022 Chronic systolic (congestive) heart failure: Secondary | ICD-10-CM

## 2017-08-07 DIAGNOSIS — I480 Paroxysmal atrial fibrillation: Secondary | ICD-10-CM

## 2017-08-07 DIAGNOSIS — I255 Ischemic cardiomyopathy: Secondary | ICD-10-CM | POA: Diagnosis not present

## 2017-08-07 NOTE — Patient Instructions (Signed)
Your physician wants you to follow-up in: 6 MONTHS WITH DR CRENSHAW You will receive a reminder letter in the mail two months in advance. If you don't receive a letter, please call our office to schedule the follow-up appointment.   If you need a refill on your cardiac medications before your next appointment, please call your pharmacy.  

## 2017-08-09 ENCOUNTER — Other Ambulatory Visit: Payer: Self-pay | Admitting: Cardiology

## 2017-08-09 DIAGNOSIS — I255 Ischemic cardiomyopathy: Secondary | ICD-10-CM

## 2017-08-10 NOTE — Telephone Encounter (Signed)
REFILL 

## 2017-08-23 ENCOUNTER — Other Ambulatory Visit: Payer: Self-pay | Admitting: Cardiology

## 2017-08-23 NOTE — Telephone Encounter (Signed)
REFILL 

## 2017-09-12 ENCOUNTER — Telehealth: Payer: Self-pay | Admitting: Internal Medicine

## 2017-09-12 NOTE — Telephone Encounter (Signed)
Phone number of physician's office given to SK for a return call.

## 2017-09-12 NOTE — Telephone Encounter (Signed)
New Message:      Dr. Tawana Scale is calling to speak with Dr. Caryl Comes. He is calling from [facility] in reference to pt

## 2017-10-09 ENCOUNTER — Ambulatory Visit (INDEPENDENT_AMBULATORY_CARE_PROVIDER_SITE_OTHER): Payer: Medicare PPO | Admitting: *Deleted

## 2017-10-09 DIAGNOSIS — I255 Ischemic cardiomyopathy: Secondary | ICD-10-CM | POA: Diagnosis not present

## 2017-10-09 NOTE — Progress Notes (Signed)
Remote ICD transmission.   

## 2017-10-10 ENCOUNTER — Encounter: Payer: Self-pay | Admitting: Cardiology

## 2017-10-10 LAB — CUP PACEART REMOTE DEVICE CHECK
Battery Remaining Longevity: 20 mo
Brady Statistic AP VP Percent: 80.84 %
Brady Statistic AP VS Percent: 0 %
Brady Statistic AS VS Percent: 0.01 %
Brady Statistic RA Percent Paced: 80.66 %
HIGH POWER IMPEDANCE MEASURED VALUE: 43 Ohm
HIGH POWER IMPEDANCE MEASURED VALUE: 55 Ohm
Implantable Lead Implant Date: 20000626
Implantable Lead Implant Date: 20000816
Implantable Lead Implant Date: 20100818
Implantable Lead Implant Date: 20100818
Implantable Lead Location: 753858
Implantable Lead Location: 753860
Implantable Lead Location: 753860
Implantable Lead Model: 5076
Implantable Lead Model: 6940
Implantable Lead Model: 6945
Lead Channel Impedance Value: 342 Ohm
Lead Channel Impedance Value: 456 Ohm
Lead Channel Impedance Value: 456 Ohm
Lead Channel Impedance Value: 779 Ohm
Lead Channel Sensing Intrinsic Amplitude: 1.375 mV
Lead Channel Setting Pacing Amplitude: 3 V
Lead Channel Setting Sensing Sensitivity: 0.3 mV
MDC IDC LEAD LOCATION: 753859
MDC IDC MSMT BATTERY VOLTAGE: 2.93 V
MDC IDC MSMT LEADCHNL LV IMPEDANCE VALUE: 456 Ohm
MDC IDC MSMT LEADCHNL RA SENSING INTR AMPL: 1.375 mV
MDC IDC MSMT LEADCHNL RV IMPEDANCE VALUE: 323 Ohm
MDC IDC MSMT LEADCHNL RV PACING THRESHOLD AMPLITUDE: 1.5 V
MDC IDC MSMT LEADCHNL RV PACING THRESHOLD PULSEWIDTH: 0.4 ms
MDC IDC PG IMPLANT DT: 20150624
MDC IDC SESS DTM: 20190610052405
MDC IDC SET LEADCHNL LV PACING AMPLITUDE: 2.5 V
MDC IDC SET LEADCHNL LV PACING PULSEWIDTH: 0.4 ms
MDC IDC SET LEADCHNL RA PACING AMPLITUDE: 2.5 V
MDC IDC SET LEADCHNL RV PACING PULSEWIDTH: 0.4 ms
MDC IDC STAT BRADY AS VP PERCENT: 19.14 %
MDC IDC STAT BRADY RV PERCENT PACED: 99.86 %

## 2017-10-13 ENCOUNTER — Telehealth: Payer: Self-pay | Admitting: *Deleted

## 2017-10-13 NOTE — Telephone Encounter (Signed)
LMTCB/sss  VT episodes treated with ATP on 10/05/17.

## 2017-10-13 NOTE — Telephone Encounter (Signed)
Patient had gotten a call from the office and was returning the call.  I reviewed the notes and he had some episodes of ATP for VT picked up on routine transmission.  This happened in 10/09/2017.  I spoke to the patient about this.  He said he had had some spells of being lightheaded and weak but they had resolved.  He just had chemotherapy, and does not feel very good today.  However, he expects to feel better in the next couple of days.  The cancer center at [facility] had done labs, I was able to review these.  Labs were done 5/30.  His potassium was 4.0 and his magnesium was 2.1.  I reviewed the data with Dr. Lovena Le and nothing further needs to be done at this time.  I will route this to Dr. Caryl Comes.  Rosaria Ferries, PA-C 10/13/2017 2:53 PM Beeper [phone]

## 2017-10-17 ENCOUNTER — Telehealth: Payer: Self-pay | Admitting: Internal Medicine

## 2017-10-17 NOTE — Telephone Encounter (Signed)
Spoke with pt, he states his oncologist would like to speak with Dr Caryl Comes. I informed pt either his oncologist may reach out to Dr Caryl Comes directly, or call the office and I can forward Dr Caryl Comes their message. Pt states he will let his oncology office know.  We also reviewed his ICD episode on 6/6. Pt verbalized understanding and had no additional questions.

## 2017-10-17 NOTE — Telephone Encounter (Signed)
Pt calling   Pt taking chemo and need to speak with you concerning some issues he had. Please call pt.

## 2017-10-19 NOTE — Telephone Encounter (Signed)
Noted  

## 2017-10-30 ENCOUNTER — Ambulatory Visit: Payer: Medicare FFS | Admitting: Adult Health

## 2017-11-05 NOTE — Progress Notes (Signed)
Cardiology Office Note   Date:  11/06/2017   ID:  Michael Krueger, DOB 1955-07-31, MRN [id number]  PCP:  System, Pcp Not In  Cardiologist: Dr. Stanford Breed Chief Complaint  Patient presents with  . Follow-up  . Edema    feet swelling     History of Present Illness: Michael Krueger is a 62 y.o. male who presents for ongoing assessment and management of ischemic cardiomyopathy, coronary artery disease, chronic systolic heart failure, status post by V ICD, history of V. tach status post ablation, atrial fibrillation on amiodarone.  Most recent echocardiogram revealed an ejection fraction of 15% with grade 2 diastolic dysfunction, mild MR, and severe LAE.  Most recent echocardiogram dated 10/2008 revealed a left main was normal, LAD stent patent with 30% ISR, mid to distal 30%, tiny marginal branch 80% proximal, proximal to the stent patent with 30% ISR, mid circumflex 40%.  RCA 70% after RV branch then occluded (bridging collaterals).  EF of 10% to 15%.  On last office visit with Dr. Stanford Breed on 08/07/2017 the patient appeared to be stable with no evidence of volume overload.  He was continued on his current medication regimen and importance of low-sodium diet was stressed.  He was continued on anticoagulation therapy with apixaban, but apparently had a CT chest in [place] and is being evaluated for chest masses.  He is currently undergoing chemotherapy at [facility].  He has periods of weakness and dizziness and has called our office to report this.  He was to continue follow-up with electrophysiology for pacemaker interrogation protocol.  He was to continue on Entresto beta-blocker and amiodarone along with statin therapy.  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 He is on his 3rd round, and has one more round to go before reassessment. He is followed by Dr. Aniceto Boss.   Past Medical History:  Diagnosis Date  . Atrial fibrillation ([place])   . AV  BLOCK, COMPLETE   . CAD   . Chronic systolic heart failure ([place])   . Gynecomastia   . HYPERLIPIDEMIA-MIXED   . HYPERTHYROIDISM   . Implantable cardiac defibrillator  medtronic    Initially related 1990s, upgrade to dual-chamber 2002, generator change 2005, generator change August 2010, hematoma evacuation September 2010.  . Ischemic cardiomyopathy   . Long term current use of anticoagulant   . OBESITY-MORBID (>100')   . TRANSAMINASES, SERUM, ELEVATED   . VENTRICULAR TACHYCARDIA    S/P RFCA x3  2006, 2010,2010    Past Surgical History:  Procedure Laterality Date  . IMPLANTABLE CARDIOVERTER DEFIBRILLATOR GENERATOR CHANGE N/A 10/23/2013   Procedure: IMPLANTABLE CARDIOVERTER DEFIBRILLATOR GENERATOR CHANGE;  Surgeon: Deboraha Sprang, MD;  Location: [facility] CATH LAB;  Service: Cardiovascular;  Laterality: N/A;  . SPLENECTOMY    . Status post Medtronic Concerto CRT-D in August 2010 with pocket revision       Current Outpatient Medications  Medication Sig Dispense Refill  . acetaminophen (TYLENOL) 500 MG tablet Take 1,000 mg by mouth daily as needed for headache.     [name] amiodarone (PACERONE) 200 MG tablet Take 200 mg by mouth daily.    [name] atorvastatin (LIPITOR) 80 MG tablet TAKE 1 TABLET (80 MG TOTAL) BY MOUTH DAILY. 90 tablet 3  . carvedilol (COREG) 12.5 MG tablet TAKE 1 TABLET (12.5 MG TOTAL) BY MOUTH 2 (TWO) TIMES DAILY. 180 tablet 1  . cycloSPORINE (RESTASIS) 0.05 % ophthalmic emulsion Place 1 drop into both eyes 2 (two) times daily.    [name]  digoxin (LANOXIN) 0.125 MG tablet TAKE 1/2 TABLET BY MOUTH DAILY 45 tablet 3  . ELIQUIS 5 MG TABS tablet TAKE 1 TABLET BY MOUTH TWICE A DAY 180 tablet 1  . ENTRESTO 24-26 MG TAKE 1 TABLET BY MOUTH 2 (TWO) TIMES DAILY. 180 tablet 3  . furosemide (LASIX) 80 MG tablet Take 1 tablet (80 mg total) by mouth 2 (two) times daily. 180 tablet 1  . metFORMIN (GLUCOPHAGE) 850 MG tablet Take 1,275 mg by mouth daily.     . pantoprazole (PROTONIX) 40 MG tablet Take 1  tablet by mouth Daily.    . potassium chloride SA (K-DUR,KLOR-CON) 20 MEQ tablet Take 40 mEq by mouth daily.    . predniSONE (DELTASONE) 10 MG tablet Take 50 mg by mouth as directed.  1  . prochlorperazine (COMPAZINE) 10 MG tablet Take 1 tablet by mouth every 6 (six) hours as needed.    . traZODone (DESYREL) 50 MG tablet Take 25-100 mg by mouth daily as needed for sleep.      No current facility-administered medications for this visit.     Allergies:   Other    Social History:  The patient  reports that he has quit smoking. He has never used smokeless tobacco. He reports that he drinks alcohol. He reports that he does not use drugs.   Family History:  The patient's family history includes Cancer in his mother; Heart attack in his maternal grandfather; Hypertension in his sister; Stroke in his paternal grandfather.    ROS: All other systems are reviewed and negative. Unless otherwise mentioned in H&P    PHYSICAL EXAM: VS:  BP 99/63   Pulse 65   Ht 5\' 2"  (1.575 m)   Wt 159 lb 9.6 oz (72.4 kg)   BMI 29.19 kg/m  , BMI Body mass index is 29.19 kg/m. GEN: Well nourished, well developed, in no acute distress  HEENT: normal  Neck: no JVD, carotid bruits, or masses Cardiac: RRR; no murmurs, rubs, or gallops,no edema  Respiratory:  Cear to auscultation bilaterally, mild crackles, normal work of breathing GI: soft, nontender, nondistended, + BS MS: no deformity or atrophy  Skin: warm and dry, no rash Neuro:  Strength and sensation are intact Psych: euthymic mood, full affect   EKG:  Not completed this visit.   Recent Labs: 07/13/2017: ALT 24; BUN 15; Creatinine, Ser 1.46; Hemoglobin 13.4; Platelets 237; Potassium 4.2; Sodium 141; TSH 2.610    Lipid Panel    Component Value Date/Time   CHOL 124 (L) 04/20/2015 1127   TRIG 162 (H) 04/20/2015 1127   HDL 26 (L) 04/20/2015 1127   CHOLHDL 4.8 04/20/2015 1127   VLDL 32 (H) 04/20/2015 1127   LDLCALC 66 04/20/2015 1127   LDLDIRECT  96.7 08/08/2011 0950      Wt Readings from Last 3 Encounters:  11/06/17 159 lb 9.6 oz (72.4 kg)  08/07/17 163 lb 12.8 oz (74.3 kg)  07/13/17 164 lb (74.4 kg)      Other studies Reviewed: Echo 07/19/2016 Left ventricle: Although there is no evidence of Lv thrombus by   definity contrast, there is swirling of contrast in the apex c/w   slow flow state which increases risk of developing LV thrombus   and longterm anticoagulation should be considered if patient is   not already on. The cavity size was severely dilated. Systolic   function was severely reduced. The estimated ejection fraction   was 15%. There is akinesis of the apical myocardium. There is  akinesis of the mid-apicalanterior and lateral myocardium. There   is akinesis of the midanteroseptal myocardium. There is akinesis   of the entireinferoseptal myocardium. There is akinesis of the   entireinferolateral and inferior myocardium. Features are   consistent with a pseudonormal left ventricular filling pattern,   with concomitant abnormal relaxation and increased filling   pressure (grade 2 diastolic dysfunction). Doppler parameters are   consistent with high ventricular filling pressure. - Aortic valve: Mildly calcified annulus. Trileaflet; normal   thickness, mildly calcified leaflets. - Mitral valve: Calcified annulus. There was mild regurgitation. - Left atrium: The atrium was severely dilated. Anterior-posterior   dimension: 47 mm. - Tricuspid valve: There was trivial regurgitation.  ASSESSMENT AND PLAN:  1.ICM: Most recent echo in 06/2016 demonstrated EF of 15%. He remains on lasix, Entresto, and carvedilol. His BP is low normal and he is asymptomatic from this.  Will check BMET.   2. CAD: Most recent cath demonstrated patent stent to the LAD, with non-obstructive disease elsewhere, . Continue secondary prevention.  3. ICD in situ: Placed in the setting of VT and significantly reduced EF. He is followed by Dr.  Caryl Comes. Remains on amiodarone.   4. Atrial fib: On amiodarone and digoxin, on Eliquis.  No complaints of bleeding.  5. Small Cell Carcinoma of the Lung: Followed at [facility], on chemo therapy and immunotherapy.  On third round with one more to go, with follow up CT/PET thereafter.   Current medicines are reviewed at length with the patient today.    Labs/ tests ordered today include: BMET.   Phill Myron. West Pugh, ANP, AACC   11/06/2017 11:51 AM    [facility] [address] [place], [place] [address] Phone: [phone]; Fax: [phone]

## 2017-11-06 ENCOUNTER — Encounter: Payer: Self-pay | Admitting: Adult Health

## 2017-11-06 ENCOUNTER — Ambulatory Visit: Payer: Medicare PPO | Admitting: Adult Health

## 2017-11-06 VITALS — BP 99/63 | HR 65 | Ht 62.0 in | Wt 159.6 lb

## 2017-11-06 DIAGNOSIS — Z79899 Other long term (current) drug therapy: Secondary | ICD-10-CM | POA: Diagnosis not present

## 2017-11-06 DIAGNOSIS — C3490 Malignant neoplasm of unspecified part of unspecified bronchus or lung: Secondary | ICD-10-CM | POA: Diagnosis not present

## 2017-11-06 DIAGNOSIS — I5022 Chronic systolic (congestive) heart failure: Secondary | ICD-10-CM

## 2017-11-06 DIAGNOSIS — Z95810 Presence of automatic (implantable) cardiac defibrillator: Secondary | ICD-10-CM

## 2017-11-06 DIAGNOSIS — I255 Ischemic cardiomyopathy: Secondary | ICD-10-CM | POA: Diagnosis not present

## 2017-11-06 DIAGNOSIS — I480 Paroxysmal atrial fibrillation: Secondary | ICD-10-CM | POA: Diagnosis not present

## 2017-11-06 DIAGNOSIS — I2510 Atherosclerotic heart disease of native coronary artery without angina pectoris: Secondary | ICD-10-CM

## 2017-11-06 LAB — CBC
Hematocrit: 33.3 % — ABNORMAL LOW (ref 37.5–51.0)
Hemoglobin: 11.7 g/dL — ABNORMAL LOW (ref 13.0–17.7)
MCH: 31.7 pg (ref 26.6–33.0)
MCHC: 35.1 g/dL (ref 31.5–35.7)
MCV: 90 fL (ref 79–97)
PLATELETS: 240 10*3/uL (ref 150–450)
RBC: 3.69 x10E6/uL — ABNORMAL LOW (ref 4.14–5.80)
RDW: 19.7 % — AB (ref 12.3–15.4)
WBC: 7.8 10*3/uL (ref 3.4–10.8)

## 2017-11-06 LAB — BASIC METABOLIC PANEL
BUN / CREAT RATIO: 14 (ref 10–24)
BUN: 14 mg/dL (ref 8–27)
CHLORIDE: 100 mmol/L (ref 96–106)
CO2: 25 mmol/L (ref 20–29)
Calcium: 8.4 mg/dL — ABNORMAL LOW (ref 8.6–10.2)
Creatinine, Ser: 1.02 mg/dL (ref 0.76–1.27)
GFR calc Af Amer: 91 mL/min/{1.73_m2} (ref >59)
GFR calc non Af Amer: 79 mL/min/{1.73_m2} (ref >59)
GLUCOSE: 142 mg/dL — AB (ref 65–99)
Potassium: 5.5 mmol/L — ABNORMAL HIGH (ref 3.5–5.2)
SODIUM: 137 mmol/L (ref 134–144)

## 2017-11-06 NOTE — Patient Instructions (Signed)
Medication Instructions:  NO CHANGES- Your physician recommends that you continue on your current medications as directed. Please refer to the Current Medication list given to you today.  If you need a refill on your cardiac medications before your next appointment, please call your pharmacy.  Labwork: BMET AND CBC TODAY HERE IN OUR OFFICE AT [facility]  Take the provided lab slips with you to the lab for your blood draw.   Testing/Procedures: DR Stanford Breed MAY ORDER ECHO AT FOLLOW UP APPT  Follow-Up: Your physician wants you to follow-up in: [place].    Thank you for choosing [facility] at [facility]!!

## 2017-11-07 ENCOUNTER — Other Ambulatory Visit: Payer: Self-pay

## 2017-11-07 DIAGNOSIS — Z79899 Other long term (current) drug therapy: Secondary | ICD-10-CM

## 2017-11-07 NOTE — Progress Notes (Signed)
Notes recorded by Lendon Colonel, NP on 11/07/2017 at 7:18 AM EDT Potassium is elevated. Reduce potassium supplement to 10 mEq daily from 20 mEq daily. Recheck labs in 2 weeks. Please forward to oncology.  Pt states (and it is on medication list) he is currently  taking 40mg  daily ONC increased to this dosage because it was low.   D/W Lawrence,NP, take potassium 70mEg daily and re-draw labs as discussed . Patient notified, no further questions

## 2017-11-15 ENCOUNTER — Other Ambulatory Visit: Payer: Self-pay | Admitting: Cardiology

## 2017-11-15 DIAGNOSIS — I4891 Unspecified atrial fibrillation: Secondary | ICD-10-CM

## 2017-11-15 NOTE — Telephone Encounter (Signed)
Rx request sent to pharmacy.  

## 2017-11-29 ENCOUNTER — Other Ambulatory Visit: Payer: Self-pay | Admitting: Cardiology

## 2017-12-05 LAB — BASIC METABOLIC PANEL
BUN / CREAT RATIO: 7 — AB (ref 10–24)
BUN: 8 mg/dL (ref 8–27)
CHLORIDE: 99 mmol/L (ref 96–106)
CO2: 27 mmol/L (ref 20–29)
CREATININE: 1.14 mg/dL (ref 0.76–1.27)
Calcium: 9 mg/dL (ref 8.6–10.2)
GFR calc non Af Amer: 69 mL/min/{1.73_m2} (ref >59)
GFR, EST AFRICAN AMERICAN: 80 mL/min/{1.73_m2} (ref >59)
Glucose: 83 mg/dL (ref 65–99)
Potassium: 4 mmol/L (ref 3.5–5.2)
SODIUM: 142 mmol/L (ref 134–144)

## 2017-12-05 LAB — BRAIN NATRIURETIC PEPTIDE: BNP: 433.3 pg/mL — ABNORMAL HIGH (ref 0.0–100.0)

## 2017-12-05 NOTE — Progress Notes (Signed)
Renal function and electrolyte ok.

## 2017-12-21 ENCOUNTER — Telehealth: Payer: Self-pay | Admitting: Internal Medicine

## 2017-12-21 NOTE — Telephone Encounter (Signed)
New Message:    Dr. Merleen Nicely requesting a call concern treating the pt for cancer, need pacmarker  To be removed

## 2017-12-21 NOTE — Telephone Encounter (Signed)
Dr Caryl Comes called and spoke with Dr Merleen Nicely regarding options with ICD mgt during radiation treatment.

## 2018-01-08 ENCOUNTER — Telehealth: Payer: Self-pay | Admitting: Cardiology

## 2018-01-08 ENCOUNTER — Ambulatory Visit (INDEPENDENT_AMBULATORY_CARE_PROVIDER_SITE_OTHER): Payer: Medicare PPO | Admitting: *Deleted

## 2018-01-08 DIAGNOSIS — I255 Ischemic cardiomyopathy: Secondary | ICD-10-CM

## 2018-01-08 NOTE — Progress Notes (Signed)
Remote ICD transmission.   

## 2018-01-08 NOTE — Telephone Encounter (Signed)
Remote transmission reviewed. Presenting rhythm: ApBp w/PACs. Stable lead measurements. (1) VT episode from 01/04/18 treated successfully with ATP x7 (5 bursts, 2 ramp), (1) VT-NS episode x 6bts. Thoracic impedance returning to baseline. 99.6%Bp. Normal device function.  Spoke to patient about audible alert that he heard on 01/07/18. Patient states that he heard an alert tone twice yesterday, but none today. I informed patient that he didn't have any "patient alerts" on his device. I told him to call back if he hears the tone again. Patient verbalized understanding.  I informed patient about the episode of VT from 9/5 treated with ATP. Patient states that he remembers feeling palpitations during the time, but denies any ShOB, dizziness, or CP. Patient states that he's been taking all of his medications as Rx'd. I informed patient of the DMV driving restriction x 6 months. Shock plan and ER precautions reviewed with patient. Patient verbalized understanding.  Will inform Dr.Klein about episode and notify patient if anything further is recommended.

## 2018-01-08 NOTE — Telephone Encounter (Signed)
Patient stated that he heard an alert tone from his device over the weekend. I instructed pt to send a manual transmission w/ his home monitor and a Device Tech RN will review and call him back. Pt verbalized understanding.

## 2018-01-09 ENCOUNTER — Encounter: Payer: Self-pay | Admitting: Cardiology

## 2018-01-16 ENCOUNTER — Telehealth: Payer: Self-pay | Admitting: *Deleted

## 2018-01-16 NOTE — Telephone Encounter (Signed)
Jenny Reichmann, RN from [place] called in stating that patient is scheduled to have radiation to his lung, Monday-Friday, from now until 02/21/18. She states that Medtr[employer] recommended that patient sends in a manual transmission after every treatment. [employer] instructed patient on how to do this. Will make note of this information and contact patient if anything abnormal is discovered.   Staff message sent to Theodoro Doing, RN and Debroah Loop, RN as well.

## 2018-01-17 ENCOUNTER — Other Ambulatory Visit: Payer: Self-pay | Admitting: Cardiology

## 2018-01-18 ENCOUNTER — Telehealth: Payer: Self-pay

## 2018-01-18 ENCOUNTER — Telehealth: Payer: Self-pay | Admitting: Cardiology

## 2018-01-18 MED ORDER — AMIODARONE HCL 200 MG PO TABS: 200.0000 mg | ORAL_TABLET | Freq: Every day | ORAL | 3 refills | Status: DC

## 2018-01-18 NOTE — Telephone Encounter (Signed)
Patient called and stated that he believes he had another tachycardia episode. He become very dizzy and had to sit down. This happened today at 2:00 PM. I instructed pt to send a remote transmission and pt verbalized understanding.

## 2018-01-18 NOTE — Telephone Encounter (Signed)
Spoke with pt regarding ATP episode from 9/18@ 1747pm pt stated that he knew the episode had occurred because he had some dizziness. Pt reported compliance with Amio, Coreg and Lanoxin. Pt aware of driving restrictions x 6 months. Stressed importance of keeping apt with [facility] on 02/21/2018 pt voiced understanding.

## 2018-01-18 NOTE — Telephone Encounter (Signed)
LVM for pt to call device clinic back regarding Amiodarone recommendations from [facility].

## 2018-01-18 NOTE — Telephone Encounter (Signed)
Spoke with pt regarding Dr .Aquilla Hacker recommendations to increase Amiodarone to 400mg  twice a day for 2 weeks, then 400mg  daily for 2 weeks and then 200mg  daily. Pt voiced understanding of these medication recommendations.

## 2018-01-19 ENCOUNTER — Other Ambulatory Visit: Payer: Self-pay | Admitting: Internal Medicine

## 2018-01-27 LAB — CUP PACEART REMOTE DEVICE CHECK
Battery Remaining Longevity: 20 mo
Battery Voltage: 2.93 V
Brady Statistic AP VP Percent: 84.32 %
Brady Statistic AS VS Percent: 0.05 %
Brady Statistic RA Percent Paced: 82.71 %
HIGH POWER IMPEDANCE MEASURED VALUE: 54 Ohm
HighPow Impedance: 41 Ohm
Implantable Lead Implant Date: 20000626
Implantable Lead Implant Date: 20000816
Implantable Lead Implant Date: 20100818
Implantable Lead Implant Date: 20100818
Implantable Lead Location: 753859
Implantable Lead Location: 753860
Implantable Lead Model: 5076
Implantable Lead Model: 6945
Lead Channel Impedance Value: 304 Ohm
Lead Channel Impedance Value: 323 Ohm
Lead Channel Impedance Value: 437 Ohm
Lead Channel Impedance Value: 456 Ohm
Lead Channel Impedance Value: 456 Ohm
Lead Channel Pacing Threshold Amplitude: 1.5 V
Lead Channel Sensing Intrinsic Amplitude: 1 mV
Lead Channel Setting Pacing Amplitude: 2.5 V
Lead Channel Setting Pacing Amplitude: 2.5 V
Lead Channel Setting Pacing Pulse Width: 0.4 ms
Lead Channel Setting Pacing Pulse Width: 0.4 ms
Lead Channel Setting Sensing Sensitivity: 0.3 mV
MDC IDC LEAD LOCATION: 753858
MDC IDC LEAD LOCATION: 753860
MDC IDC MSMT LEADCHNL LV IMPEDANCE VALUE: 760 Ohm
MDC IDC MSMT LEADCHNL RA SENSING INTR AMPL: 1 mV
MDC IDC MSMT LEADCHNL RV PACING THRESHOLD PULSEWIDTH: 0.4 ms
MDC IDC PG IMPLANT DT: 20150624
MDC IDC SESS DTM: 20190909062603
MDC IDC SET LEADCHNL RV PACING AMPLITUDE: 3 V
MDC IDC STAT BRADY AP VS PERCENT: 0.01 %
MDC IDC STAT BRADY AS VP PERCENT: 15.61 %
MDC IDC STAT BRADY RV PERCENT PACED: 99.59 %

## 2018-01-28 ENCOUNTER — Other Ambulatory Visit: Payer: Self-pay | Admitting: Cardiology

## 2018-01-29 ENCOUNTER — Telehealth: Payer: Self-pay | Admitting: Cardiology

## 2018-01-29 NOTE — Telephone Encounter (Signed)
Nurse from [facility] health called and wanted to make sure patient remote transmission results. Call routed to [place].

## 2018-01-29 NOTE — Telephone Encounter (Signed)
Spoke to Antelope  from [place] about patient's most recent remote transmission. I informed Jenny Reichmann that patient's impedance measurements and RV threshold are all stable. Cindy verbalized understanding and states that she will call back next week to check on patient's remotes again.

## 2018-02-07 NOTE — Progress Notes (Deleted)
HPI: FU CAD, ischemic cardiomyopathy, systolic HF, status post BiV-ICD, V. tach status post ablation, atrial fibrillation on amiodarone.   Since last seen,patient has been diagnosed with small cell lung cancer.     Studies/Reports Reviewed Today:  Echo3/18 -EF 15, grade 2 DD, mild MR, severe LAE  Myoview 08/2011 Large area of scar in the anterior, anteroseptal, inferoseptal, inferior, anterolateral and apical distributions.  No signif ischemia. LV Ejection Fraction: 14%. LV Wall Motion: Severe diffuse hypokinesis, inferior, apical akinesis.  Cardiac catheterization 10/2008 LM: Okay LAD: Stent patent with 30% ISR, mid and distal 30% LCx: Tiny marginal branch 80% proximal, proximal to this stent patent with 30% ISR, mid CFX 40% RCA: 70% after RV branch then occluded (bridging collaterals) EF 10-15%  Current Outpatient Medications  Medication Sig Dispense Refill  . acetaminophen (TYLENOL) 500 MG tablet Take 1,000 mg by mouth daily as needed for headache.     [name] amiodarone (PACERONE) 200 MG tablet Take 200 mg by mouth daily.    [name] amiodarone (PACERONE) 200 MG tablet TAKE 1 TABLET BY MOUTH EVERY MORNING AND 1/2 TABLET IN THE EVENING 135 tablet 0  . amiodarone (PACERONE) 200 MG tablet Take 1 tablet (200 mg total) by mouth daily for 77 doses. 90 tablet 3  . atorvastatin (LIPITOR) 80 MG tablet TAKE 1 TABLET (80 MG TOTAL) BY MOUTH DAILY. 90 tablet 3  . carvedilol (COREG) 12.5 MG tablet TAKE 1 TABLET (12.5 MG TOTAL) BY MOUTH 2 (TWO) TIMES DAILY. 180 tablet 1  . cycloSPORINE (RESTASIS) 0.05 % ophthalmic emulsion Place 1 drop into both eyes 2 (two) times daily.    . digoxin (LANOXIN) 0.125 MG tablet TAKE 1/2 TABLET BY MOUTH DAILY 45 tablet 3  . ELIQUIS 5 MG TABS tablet TAKE 1 TABLET BY MOUTH TWICE A DAY 180 tablet 1  . ENTRESTO 24-26 MG TAKE 1 TABLET BY MOUTH 2 (TWO) TIMES DAILY. 180 tablet 3  . furosemide (LASIX) 80 MG tablet TAKE 1 TABLET BY MOUTH TWICE A DAY 180  tablet 3  . metFORMIN (GLUCOPHAGE) 850 MG tablet Take 1,275 mg by mouth daily.     . pantoprazole (PROTONIX) 40 MG tablet Take 1 tablet by mouth Daily.    . potassium chloride SA (K-DUR,KLOR-CON) 20 MEQ tablet Take 40 mEq by mouth daily.    . potassium chloride SA (KLOR-CON M20) 20 MEQ tablet Take 2 tablets (40 mEq total) by mouth daily. 90 tablet 3  . predniSONE (DELTASONE) 10 MG tablet Take 50 mg by mouth as directed.  1  . prochlorperazine (COMPAZINE) 10 MG tablet Take 1 tablet by mouth every 6 (six) hours as needed.    . traZODone (DESYREL) 50 MG tablet Take 25-100 mg by mouth daily as needed for sleep.      No current facility-administered medications for this visit.      Past Medical History:  Diagnosis Date  . Atrial fibrillation ([place])   . AV BLOCK, COMPLETE   . CAD   . Chronic systolic heart failure ([place])   . Gynecomastia   . HYPERLIPIDEMIA-MIXED   . HYPERTHYROIDISM   . Implantable cardiac defibrillator  medtronic    Initially related 1990s, upgrade to dual-chamber 2002, generator change 2005, generator change August 2010, hematoma evacuation September 2010.  . Ischemic cardiomyopathy   . Long term current use of anticoagulant   . OBESITY-MORBID (>100')   . TRANSAMINASES, SERUM, ELEVATED   . VENTRICULAR TACHYCARDIA    S/P RFCA x3  2006, 2010,2010  Past Surgical History:  Procedure Laterality Date  . IMPLANTABLE CARDIOVERTER DEFIBRILLATOR GENERATOR CHANGE N/A 10/23/2013   Procedure: IMPLANTABLE CARDIOVERTER DEFIBRILLATOR GENERATOR CHANGE;  Surgeon: Deboraha Sprang, MD;  Location: [facility] C[facility] LAB;  Service: Cardiovascular;  Laterality: N/A;  . SPLENECTOMY    . Status post Medtronic Concerto CRT-D in August 2010 with pocket revision      Social History   Socioeconomic History  . Marital status: Single    Spouse name: Not on file  . Number of children: Not on file  . Years of education: Not on file  . Highest education level: Not on file  Occupational History  . Not  on file  Social Needs  . Financial resource strain: Not on file  . Food insecurity:    Worry: Not on file    Inability: Not on file  . Transportation needs:    Medical: Not on file    Non-medical: Not on file  Tobacco Use  . Smoking status: Former Research scientist (life sciences)  . Smokeless tobacco: Never Used  Substance and Sexual Activity  . Alcohol use: Yes  . Drug use: No  . Sexual activity: Not on file  Lifestyle  . Physical activity:    Days per week: Not on file    Minutes per session: Not on file  . Stress: Not on file  Relationships  . Social connections:    Talks on phone: Not on file    Gets together: Not on file    Attends religious service: Not on file    Active member of club or organization: Not on file    Attends meetings of clubs or organizations: Not on file    Relationship status: Not on file  . Intimate partner violence:    Fear of current or ex partner: Not on file    Emotionally abused: Not on file    Physically abused: Not on file    Forced sexual activity: Not on file  Other Topics Concern  . Not on file  Social History Narrative  . Not on file    Family History  Problem Relation Age of Onset  . Cancer Mother   . Heart attack Maternal Grandfather   . Stroke Paternal Grandfather   . Hypertension Sister     ROS: no fevers or chills, productive cough, hemoptysis, dysphasia, odynophagia, melena, hematochezia, dysuria, hematuria, rash, seizure activity, orthopnea, PND, pedal edema, claudication. Remaining systems are negative.  Physical Exam: Well-developed well-nourished in no acute distress.  Skin is warm and dry.  HEENT is normal.  Neck is supple.  Chest is clear to auscultation with normal expansion.  Cardiovascular exam is regular rate and rhythm.  Abdominal exam nontender or distended. No masses palpated. Extremities show no edema. neuro grossly intact  ECG- personally reviewed  A/P  1  Kirk Ruths, MD

## 2018-02-08 ENCOUNTER — Telehealth: Payer: Self-pay | Admitting: Cardiology

## 2018-02-08 MED ORDER — POTASSIUM CHLORIDE 20 MEQ/15ML (10%) PO SOLN: 40.0000 meq | Freq: Every day | ORAL | 6 refills | Status: DC

## 2018-02-08 NOTE — Telephone Encounter (Signed)
New Message:     Pt wants to know if Dr Stanford Breed can change his Potassium pill to a liquid form please. If so, please call to [facility] [phone].

## 2018-02-08 NOTE — Telephone Encounter (Signed)
New script sent to the pharmacy for liquid potassium.

## 2018-02-16 NOTE — Progress Notes (Signed)
Electrophysiology Office Note Date: 02/21/2018  ID:  Michael Krueger, DOB 1955/08/27, MRN [id number]  PCP: System, Pcp Not In Primary Cardiologist: Stanford Breed Electrophysiologist: Caryl Comes  CC: Routine ICD follow-up  Michael BALDERSON is a 62 y.o. male seen today for Dr Caryl Comes.  He presents today for routine electrophysiology followup.  Since last being seen in our clinic, the patient reports doing relatively well. He denies chest pain, palpitations, dyspnea, PND, orthopnea, nausea, vomiting, dizziness, syncope, edema, weight gain, or early satiety.  He has not had ICD shocks.   Device History: MDT CRTD implanted 2010 for cardiomyopathy, CHF; gen change 2015 History of appropriate therapy: Yes History of AAD therapy: Yes - amiodarone   Past Medical History:  Diagnosis Date  . Atrial fibrillation ([place])   . AV BLOCK, COMPLETE   . CAD   . Chronic systolic heart failure ([place])   . Gynecomastia   . HYPERLIPIDEMIA-MIXED   . HYPERTHYROIDISM   . Implantable cardiac defibrillator  medtronic    Initially related 1990s, upgrade to dual-chamber 2002, generator change 2005, generator change August 2010, hematoma evacuation September 2010.  . Ischemic cardiomyopathy   . Long term current use of anticoagulant   . OBESITY-MORBID (>100')   . TRANSAMINASES, SERUM, ELEVATED   . VENTRICULAR TACHYCARDIA    S/P RFCA x3  2006, 2010,2010   Past Surgical History:  Procedure Laterality Date  . IMPLANTABLE CARDIOVERTER DEFIBRILLATOR GENERATOR CHANGE N/A 10/23/2013   Procedure: IMPLANTABLE CARDIOVERTER DEFIBRILLATOR GENERATOR CHANGE;  Surgeon: Deboraha Sprang, MD;  Location: [facility] CATH LAB;  Service: Cardiovascular;  Laterality: N/A;  . SPLENECTOMY    . Status post Medtronic Concerto CRT-D in August 2010 with pocket revision      Current Outpatient Medications  Medication Sig Dispense Refill  . acetaminophen (TYLENOL) 500 MG tablet Take 1,000 mg by mouth daily as needed for headache.     Michael Krueger  amiodarone (PACERONE) 200 MG tablet Take 1 tablet (200 mg total) by mouth daily for 77 doses. 90 tablet 3  . atorvastatin (LIPITOR) 80 MG tablet TAKE 1 TABLET (80 MG TOTAL) BY MOUTH DAILY. 90 tablet 3  . carvedilol (COREG) 12.5 MG tablet TAKE 1 TABLET (12.5 MG TOTAL) BY MOUTH 2 (TWO) TIMES DAILY. 180 tablet 1  . cycloSPORINE (RESTASIS) 0.05 % ophthalmic emulsion Place 1 drop into both eyes 2 (two) times daily.    . digoxin (LANOXIN) 0.125 MG tablet TAKE 1/2 TABLET BY MOUTH DAILY 45 tablet 3  . ELIQUIS 5 MG TABS tablet TAKE 1 TABLET BY MOUTH TWICE A DAY 180 tablet 1  . ENTRESTO 24-26 MG TAKE 1 TABLET BY MOUTH 2 (TWO) TIMES DAILY. 180 tablet 3  . furosemide (LASIX) 80 MG tablet TAKE 1 TABLET BY MOUTH TWICE A DAY 180 tablet 3  . metFORMIN (GLUCOPHAGE) 850 MG tablet Take 1,275 mg by mouth daily.     . pantoprazole (PROTONIX) 40 MG tablet Take 1 tablet by mouth Daily.    . potassium chloride 20 MEQ/15ML (10%) SOLN Take 30 mLs (40 mEq total) by mouth daily. 900 mL 6  . prochlorperazine (COMPAZINE) 10 MG tablet Take 1 tablet by mouth every 6 (six) hours as needed.    . traZODone (DESYREL) 50 MG tablet Take 25-100 mg by mouth daily as needed for sleep.      No current facility-administered medications for this visit.     Allergies:   Other   Social History: Social History   Socioeconomic History  . Marital status: Single  Spouse name: Not on file  . Number of children: Not on file  . Years of education: Not on file  . Highest education level: Not on file  Occupational History  . Not on file  Social Needs  . Financial resource strain: Not on file  . Food insecurity:    Worry: Not on file    Inability: Not on file  . Transportation needs:    Medical: Not on file    Non-medical: Not on file  Tobacco Use  . Smoking status: Former Research scientist (life sciences)  . Smokeless tobacco: Never Used  Substance and Sexual Activity  . Alcohol use: Yes  . Drug use: No  . Sexual activity: Not on file  Lifestyle  .  Physical activity:    Days per week: Not on file    Minutes per session: Not on file  . Stress: Not on file  Relationships  . Social connections:    Talks on phone: Not on file    Gets together: Not on file    Attends religious service: Not on file    Active member of club or organization: Not on file    Attends meetings of clubs or organizations: Not on file    Relationship status: Not on file  . Intimate partner violence:    Fear of current or ex partner: Not on file    Emotionally abused: Not on file    Physically abused: Not on file    Forced sexual activity: Not on file  Other Topics Concern  . Not on file  Social History Narrative  . Not on file    Family History: Family History  Problem Relation Age of Onset  . Cancer Mother   . Heart attack Maternal Grandfather   . Stroke Paternal Grandfather   . Hypertension Sister     Review of Systems: All other systems reviewed and are otherwise negative except as noted above.   Physical Exam: VS:  BP (!) 82/58   Pulse 72   Ht 5\' 2"  (1.575 m)   Wt 156 lb (70.8 kg)   BMI 28.53 kg/m  , BMI Body mass index is 28.53 kg/m.  GEN- The patient is well appearing, alert and oriented x 3 today.   HEENT: normocephalic, atraumatic; sclera clear, conjunctiva pink; hearing intact; oropharynx clear; neck supple Lungs- Clear to ausculation bilaterally, normal work of breathing.  No wheezes, rales, rhonchi Heart- Regular rate and rhythm (paced) GI- soft, non-tender, non-distended, bowel sounds present  Extremities- no clubbing, cyanosis, or edema  MS- no significant deformity or atrophy Skin- warm and dry, no rash or lesion; ICD pocket well healed, +radiation burn right neck Psych- euthymic mood, full affect Neuro- strength and sensation are intact  ICD interrogation- reviewed in detail today,  See PACEART report  EKG:  EKG is not ordered today.  Recent Labs: 07/13/2017: ALT 24; TSH 2.610 11/06/2017: Hemoglobin 11.7; Platelets  240 12/04/2017: BNP 433.3; BUN 8; Creatinine, Ser 1.14; Potassium 4.0; Sodium 142   Wt Readings from Last 3 Encounters:  02/21/18 156 lb (70.8 kg)  11/06/17 159 lb 9.6 oz (72.4 kg)  08/07/17 163 lb 12.8 oz (74.3 kg)     Other studies Reviewed: Additional studies/ records that were reviewed today include: Dr Caryl Comes and Dr Jacalyn Lefevre office notes   Assessment and Plan:  1.  Chronic systolic dysfunction euvolemic today Stable on an appropriate medical regimen Normal ICD function See Pace Art report No changes today BP is low today, but he is  asymptomatic. Will follow  2.  VT No recent recurrence of sustained VT Continue amiodarone  3.  Paroxysmal atrial fibrillation Burden by device interrogation 0% Continue Eliquis for CHADS2VASC of 2  4. CAD No recent ischemic symptoms Continue current therapy    Current medicines are reviewed at length with the patient today.   The patient does not have concerns regarding his medicines.  The following changes were made today:  Michael Krueger  Labs/ tests ordered today include: Michael Krueger Orders Placed This Encounter  Procedures  . CUP PACEART INCLINIC DEVICE CHECK     Disposition:   Follow up with [facility], Dr Stanford Breed as scheduled, Dr Caryl Comes 3 months    Signed, Chanetta Maximo, NP 02/21/2018 9:39 AM  [address] [place] [address] [phone] (office) [phone] (fax)

## 2018-02-18 ENCOUNTER — Other Ambulatory Visit: Payer: Self-pay | Admitting: Cardiology

## 2018-02-18 DIAGNOSIS — I4891 Unspecified atrial fibrillation: Secondary | ICD-10-CM

## 2018-02-19 ENCOUNTER — Other Ambulatory Visit: Payer: Self-pay | Admitting: Cardiology

## 2018-02-20 ENCOUNTER — Ambulatory Visit: Payer: Medicare PPO | Admitting: Cardiology

## 2018-02-21 ENCOUNTER — Encounter: Payer: Self-pay | Admitting: Nurse Practitioner

## 2018-02-21 ENCOUNTER — Ambulatory Visit: Payer: Medicare PPO | Admitting: Nurse Practitioner

## 2018-02-21 VITALS — BP 82/58 | HR 72 | Ht 62.0 in | Wt 156.0 lb

## 2018-02-21 DIAGNOSIS — I472 Ventricular tachycardia: Secondary | ICD-10-CM

## 2018-02-21 DIAGNOSIS — I255 Ischemic cardiomyopathy: Secondary | ICD-10-CM

## 2018-02-21 DIAGNOSIS — I480 Paroxysmal atrial fibrillation: Secondary | ICD-10-CM

## 2018-02-21 DIAGNOSIS — I2510 Atherosclerotic heart disease of native coronary artery without angina pectoris: Secondary | ICD-10-CM | POA: Diagnosis not present

## 2018-02-21 DIAGNOSIS — I5022 Chronic systolic (congestive) heart failure: Secondary | ICD-10-CM | POA: Diagnosis not present

## 2018-02-21 DIAGNOSIS — I4729 Other ventricular tachycardia: Secondary | ICD-10-CM

## 2018-02-21 LAB — CUP PACEART INCLINIC DEVICE CHECK
Date Time Interrogation Session: 20191023092248
Implantable Lead Implant Date: 20100818
Implantable Lead Location: 753860
Implantable Lead Model: 5076
Implantable Lead Model: 6940
MDC IDC LEAD IMPLANT DT: 20000626
MDC IDC LEAD IMPLANT DT: 20000816
MDC IDC LEAD IMPLANT DT: 20100818
MDC IDC LEAD LOCATION: 753858
MDC IDC LEAD LOCATION: 753859
MDC IDC LEAD LOCATION: 753860
MDC IDC PG IMPLANT DT: 20150624

## 2018-02-21 NOTE — Patient Instructions (Addendum)
Medication Instructions:   Your physician recommends that you continue on your current medications as directed. Please refer to the Current Medication list given to you today.    If you need a refill on your cardiac medications before your next appointment, please call your pharmacy.  Labwork: NONE ORDERED  TODAY    Testing/Procedures:  NONE ORDERED  TODAY    Follow-Up:  IN 3 MONTHS WITH DR Caryl Comes   Remote monitoring is used to monitor your Pacemaker of ICD from home. This monitoring reduces the number of office visits required to check your device to one time per year. It allows [place] to keep an eye on the functioning of your device to ensure it is working properly. You are scheduled for a device check from home on . 04-09-18 You may send your transmission at any time that day. If you have a wireless device, the transmission will be sent automatically. After your physician reviews your transmission, you will receive a postcard with your next transmission date.     Any Other Special Instructions Will Be Listed Below (If Applicable).

## 2018-02-22 ENCOUNTER — Other Ambulatory Visit: Payer: Self-pay | Admitting: Cardiology

## 2018-03-22 NOTE — Progress Notes (Signed)
HPI: FU CAD, ischemic cardiomyopathy, systolic HF, status post BiV-ICD, V. tach status post ablation, atrial fibrillation on amiodarone.  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  Since last seen,he denies dyspnea, chest pain or palpitations.  Over the past 1 to 2 months he has had problems with nausea and vomiting after eating and has had decreased p.o. intake.  He also describes diarrhea after eating.  He is describing weakness and dizziness with standing.  He has not had syncope.     Studies/Reports Reviewed Today:  Echo3/18 -EF 15, grade 2 DD, mild MR, severe LAE  Myoview 08/2011 Large area of scar in the anterior, anteroseptal, inferoseptal, inferior, anterolateral and apical distributions.  No signif ischemia. LV Ejection Fraction: 14%. LV Wall Motion: Severe diffuse hypokinesis, inferior, apical akinesis.  Cardiac catheterization 10/2008 LM: Okay LAD: Stent patent with 30% ISR, mid and distal 30% LCx: Tiny marginal branch 80% proximal, proximal to this stent patent with 30% ISR, mid CFX 40% RCA: 70% after RV branch then occluded (bridging collaterals) EF 10-15%  Current Outpatient Medications  Medication Sig Dispense Refill  . acetaminophen (TYLENOL) 500 MG tablet Take 1,000 mg by mouth daily as needed for headache.     [name] amiodarone (PACERONE) 200 MG tablet Take 1 tablet (200 mg total) by mouth daily for 77 doses. 90 tablet 3  . atorvastatin (LIPITOR) 80 MG tablet TAKE 1 TABLET (80 MG TOTAL) BY MOUTH DAILY. 90 tablet 3  . carvedilol (COREG) 12.5 MG tablet TAKE 1 TABLET (12.5 MG TOTAL) BY MOUTH 2 (TWO) TIMES DAILY. 180 tablet 1  . cycloSPORINE (RESTASIS) 0.05 % ophthalmic emulsion Place 1 drop into both eyes 2 (two) times daily.    . digoxin (LANOXIN) 0.125 MG tablet TAKE 1/2 TABLET BY MOUTH DAILY 45 tablet 3  . ELIQUIS 5 MG TABS tablet TAKE 1 TABLET BY MOUTH TWICE A DAY 180 tablet 1  . ENTRESTO 24-26 MG TAKE 1 TABLET BY MOUTH 2 (TWO) TIMES  DAILY. 180 tablet 3  . furosemide (LASIX) 80 MG tablet TAKE 1 TABLET BY MOUTH TWICE A DAY 180 tablet 3  . metFORMIN (GLUCOPHAGE) 850 MG tablet Take 850 mg by mouth daily.     . ondansetron (ZOFRAN) 8 MG tablet Take 8 mg by mouth every 8 (eight) hours as needed for nausea or vomiting.    . pantoprazole (PROTONIX) 40 MG tablet Take 1 tablet by mouth Daily.    [name] PARoxetine (PAXIL) 20 MG tablet Take 20 mg by mouth daily.    . potassium chloride (KLOR-CON) 20 MEQ packet Take 20 mEq by mouth daily.    . traZODone (DESYREL) 50 MG tablet Take 25-100 mg by mouth daily as needed for sleep.      No current facility-administered medications for this visit.      Past Medical History:  Diagnosis Date  . Atrial fibrillation ([place])   . AV BLOCK, COMPLETE   . CAD   . Chronic systolic heart failure ([place])   . Gynecomastia   . HYPERLIPIDEMIA-MIXED   . HYPERTHYROIDISM   . Implantable cardiac defibrillator  medtronic    Initially related 1990s, upgrade to dual-chamber 2002, generator change 2005, generator change August 2010, hematoma evacuation September 2010.  . Ischemic cardiomyopathy   . Long term current use of anticoagulant   . OBESITY-MORBID (>100')   . TRANSAMINASES, SERUM, ELEVATED   . VENTRICULAR TACHYCARDIA    S/P RFCA x3  2006, 2010,2010    Past Surgical  History:  Procedure Laterality Date  . IMPLANTABLE CARDIOVERTER DEFIBRILLATOR GENERATOR CHANGE N/A 10/23/2013   Procedure: IMPLANTABLE CARDIOVERTER DEFIBRILLATOR GENERATOR CHANGE;  Surgeon: Deboraha Sprang, MD;  Location[facility] C[facility] LAB;  Service: Cardiovascular;  Laterality: N/A;  . SPLENECTOMY    . Status post Medtronic Concerto CRT-D in August 2010 with pocket revision      Social History   Socioeconomic History  . Marital status: Single    Spouse name: Not on file  . Number of children: Not on file  . Years of education: Not on file  . Highest education level: Not on file  Occupational History  . Not on file  Social Needs  .  Financial resource strain: Not on file  . Food insecurity:    Worry: Not on file    Inability: Not on file  . Transportation needs:    Medical: Not on file    Non-medical: Not on file  Tobacco Use  . Smoking status: Former Research scientist (life sciences)  . Smokeless tobacco: Never Used  Substance and Sexual Activity  . Alcohol use: Yes  . Drug use: No  . Sexual activity: Not on file  Lifestyle  . Physical activity:    Days per week: Not on file    Minutes per session: Not on file  . Stress: Not on file  Relationships  . Social connections:    Talks on phone: Not on file    Gets together: Not on file    Attends religious service: Not on file    Active member of club or organization: Not on file    Attends meetings of clubs or organizations: Not on file    Relationship status: Not on file  . Intimate partner violence:    Fear of current or ex partner: Not on file    Emotionally abused: Not on file    Physically abused: Not on file    Forced sexual activity: Not on file  Other Topics Concern  . Not on file  Social History Narrative  . Not on file    Family History  Problem Relation Age of Onset  . Cancer Mother   . Heart attack Maternal Grandfather   . Stroke Paternal Grandfather   . Hypertension Sister     ROS: no fevers or chills, productive cough, hemoptysis, dysphasia, odynophagia, melena, hematochezia, dysuria, hematuria, rash, seizure activity, orthopnea, PND, pedal edema, claudication. Remaining systems are negative.  Physical Exam: Well-developed well-nourished in no acute distress.  Skin is warm and dry.  HEENT is normal.  Neck is supple.  Chest is clear to auscultation with normal expansion.  Cardiovascular exam is regular rate and rhythm.  Abdominal exam nontender or distended. No masses palpated. Extremities show no edema. neuro grossly intact  A/P  1 chronic systolic congestive heart failure-patient is not volume overloaded on examination.  However his blood pressure is  low likely from dehydration/decreased oral intake/nausea and vomiting after eating.  This has been since he received radiation for his lung cancer.  He is also describing weakness and orthostatic symptoms.  Systolic blood pressure in the office today is 70.  I will transfer the patient to the emergency room and he will likely need gentle hydration (I have discussed the patient with the emergency room physicians at [facility] long; would likely admit to hospitalist service; cardiology will round tomorrow).  Would also hold Lasix, potassium, Entresto and carvedilol.  He will need baseline laboratories checked as well.  He likely will need admission overnight for  gentle hydration and adjustment of cardiac medications.  Presentation is likely related to recent chemotherapy/radiation therapy for his lung cancer with diminished p.o. intake.  2 ischemic cardiomyopathy-blood pressure is low today.  Likely related to dehydration.  Hold Entresto and carvedilol for now until blood pressure improves.  Resume at lower dose as tolerated.  3 paroxysmal atrial fibrillation-continue present dose of amiodarone.  He remains in sinus rhythm on examination.  Continue apixaban.  Check TSH and liver functions.    4 coronary artery disease-continue statin.  We have not treated with aspirin given need for apixaban.  5 prior ICD-followed by electrophysiology.  6 ventricular tachycardia-continue present dose of amiodarone.  7 hyperlipidemia-continue statin.  Kirk Ruths, MD

## 2018-04-03 ENCOUNTER — Ambulatory Visit: Payer: Medicare PPO | Admitting: Cardiology

## 2018-04-03 ENCOUNTER — Observation Stay (HOSPITAL_COMMUNITY): Payer: Medicare PPO

## 2018-04-03 ENCOUNTER — Encounter: Payer: Self-pay | Admitting: Cardiology

## 2018-04-03 ENCOUNTER — Observation Stay (HOSPITAL_COMMUNITY)
Admission: EM | Admit: 2018-04-03 | Discharge: 2018-04-05 | Disposition: A | Discharge status: Home / Self Care | Payer: Medicare PPO | Attending: Internal Medicine | Admitting: Internal Medicine

## 2018-04-03 ENCOUNTER — Encounter (HOSPITAL_COMMUNITY): Payer: Self-pay

## 2018-04-03 ENCOUNTER — Other Ambulatory Visit: Payer: Self-pay

## 2018-04-03 VITALS — BP 70/50 | HR 75 | Ht 62.0 in | Wt 147.0 lb

## 2018-04-03 DIAGNOSIS — I480 Paroxysmal atrial fibrillation: Secondary | ICD-10-CM

## 2018-04-03 DIAGNOSIS — I2510 Atherosclerotic heart disease of native coronary artery without angina pectoris: Secondary | ICD-10-CM | POA: Diagnosis not present

## 2018-04-03 DIAGNOSIS — Z7901 Long term (current) use of anticoagulants: Secondary | ICD-10-CM | POA: Insufficient documentation

## 2018-04-03 DIAGNOSIS — I4891 Unspecified atrial fibrillation: Secondary | ICD-10-CM | POA: Diagnosis present

## 2018-04-03 DIAGNOSIS — I472 Ventricular tachycardia: Secondary | ICD-10-CM | POA: Diagnosis not present

## 2018-04-03 DIAGNOSIS — I951 Orthostatic hypotension: Secondary | ICD-10-CM

## 2018-04-03 DIAGNOSIS — Z79899 Other long term (current) drug therapy: Secondary | ICD-10-CM | POA: Insufficient documentation

## 2018-04-03 DIAGNOSIS — N179 Acute kidney failure, unspecified: Secondary | ICD-10-CM | POA: Diagnosis present

## 2018-04-03 DIAGNOSIS — Z95810 Presence of automatic (implantable) cardiac defibrillator: Secondary | ICD-10-CM | POA: Insufficient documentation

## 2018-04-03 DIAGNOSIS — I5022 Chronic systolic (congestive) heart failure: Secondary | ICD-10-CM | POA: Diagnosis not present

## 2018-04-03 DIAGNOSIS — C3490 Malignant neoplasm of unspecified part of unspecified bronchus or lung: Secondary | ICD-10-CM | POA: Diagnosis not present

## 2018-04-03 DIAGNOSIS — Z9081 Acquired absence of spleen: Secondary | ICD-10-CM | POA: Diagnosis not present

## 2018-04-03 DIAGNOSIS — I9589 Other hypotension: Secondary | ICD-10-CM

## 2018-04-03 DIAGNOSIS — E0590 Thyrotoxicosis, unspecified without thyrotoxic crisis or storm: Secondary | ICD-10-CM | POA: Diagnosis not present

## 2018-04-03 DIAGNOSIS — F17210 Nicotine dependence, cigarettes, uncomplicated: Secondary | ICD-10-CM | POA: Insufficient documentation

## 2018-04-03 DIAGNOSIS — E119 Type 2 diabetes mellitus without complications: Secondary | ICD-10-CM | POA: Insufficient documentation

## 2018-04-03 DIAGNOSIS — R112 Nausea with vomiting, unspecified: Secondary | ICD-10-CM | POA: Diagnosis present

## 2018-04-03 DIAGNOSIS — E782 Mixed hyperlipidemia: Secondary | ICD-10-CM | POA: Diagnosis not present

## 2018-04-03 DIAGNOSIS — E860 Dehydration: Secondary | ICD-10-CM | POA: Insufficient documentation

## 2018-04-03 DIAGNOSIS — Z923 Personal history of irradiation: Secondary | ICD-10-CM | POA: Diagnosis not present

## 2018-04-03 DIAGNOSIS — E669 Obesity, unspecified: Secondary | ICD-10-CM | POA: Insufficient documentation

## 2018-04-03 DIAGNOSIS — E861 Hypovolemia: Secondary | ICD-10-CM | POA: Insufficient documentation

## 2018-04-03 DIAGNOSIS — Z7984 Long term (current) use of oral hypoglycemic drugs: Secondary | ICD-10-CM | POA: Diagnosis not present

## 2018-04-03 DIAGNOSIS — I959 Hypotension, unspecified: Secondary | ICD-10-CM | POA: Diagnosis present

## 2018-04-03 DIAGNOSIS — I5023 Acute on chronic systolic (congestive) heart failure: Secondary | ICD-10-CM | POA: Diagnosis present

## 2018-04-03 DIAGNOSIS — Z9221 Personal history of antineoplastic chemotherapy: Secondary | ICD-10-CM | POA: Diagnosis not present

## 2018-04-03 DIAGNOSIS — Z6826 Body mass index (BMI) 26.0-26.9, adult: Secondary | ICD-10-CM | POA: Diagnosis not present

## 2018-04-03 DIAGNOSIS — I255 Ischemic cardiomyopathy: Secondary | ICD-10-CM | POA: Diagnosis not present

## 2018-04-03 DIAGNOSIS — Z8249 Family history of ischemic heart disease and other diseases of the circulatory system: Secondary | ICD-10-CM | POA: Insufficient documentation

## 2018-04-03 LAB — CBC WITH DIFFERENTIAL/PLATELET
Abs Immature Granulocytes: 0.04 10*3/uL (ref 0.00–0.07)
BASOS ABS: 0 10*3/uL (ref 0.0–0.1)
Basophils Relative: 0 %
Eosinophils Absolute: 0 10*3/uL (ref 0.0–0.5)
Eosinophils Relative: 0 %
HCT: 40 % (ref 39.0–52.0)
Hemoglobin: 13.8 g/dL (ref 13.0–17.0)
IMMATURE GRANULOCYTES: 1 %
Lymphocytes Relative: 9 %
Lymphs Abs: 0.7 10*3/uL (ref 0.7–4.0)
MCH: 34.1 pg — ABNORMAL HIGH (ref 26.0–34.0)
MCHC: 34.5 g/dL (ref 30.0–36.0)
MCV: 98.8 fL (ref 80.0–100.0)
Monocytes Absolute: 1.1 10*3/uL — ABNORMAL HIGH (ref 0.1–1.0)
Monocytes Relative: 13 %
NRBC: 0 % (ref 0.0–0.2)
Neutro Abs: 6.2 10*3/uL (ref 1.7–7.7)
Neutrophils Relative %: 77 %
Platelets: 195 10*3/uL (ref 150–400)
RBC: 4.05 MIL/uL — ABNORMAL LOW (ref 4.22–5.81)
RDW: 15.8 % — ABNORMAL HIGH (ref 11.5–15.5)
WBC: 8 10*3/uL (ref 4.0–10.5)

## 2018-04-03 LAB — COMPREHENSIVE METABOLIC PANEL
ALT: 26 U/L (ref 0–44)
AST: 41 U/L (ref 15–41)
Albumin: 3.8 g/dL (ref 3.5–5.0)
Alkaline Phosphatase: 75 U/L (ref 38–126)
Anion gap: 11 (ref 5–15)
BUN: 25 mg/dL — ABNORMAL HIGH (ref 8–23)
CO2: 28 mmol/L (ref 22–32)
Calcium: 8.8 mg/dL — ABNORMAL LOW (ref 8.9–10.3)
Chloride: 98 mmol/L (ref 98–111)
Creatinine, Ser: 2.27 mg/dL — ABNORMAL HIGH (ref 0.61–1.24)
GFR calc Af Amer: 35 mL/min — ABNORMAL LOW (ref >60)
GFR calc non Af Amer: 30 mL/min — ABNORMAL LOW (ref >60)
Glucose, Bld: 101 mg/dL — ABNORMAL HIGH (ref 70–99)
Potassium: 3.7 mmol/L (ref 3.5–5.1)
Sodium: 137 mmol/L (ref 135–145)
TOTAL PROTEIN: 7.2 g/dL (ref 6.5–8.1)
Total Bilirubin: 1 mg/dL (ref 0.3–1.2)

## 2018-04-03 LAB — GLUCOSE, CAPILLARY: Glucose-Capillary: 103 mg/dL — ABNORMAL HIGH (ref 70–99)

## 2018-04-03 MED ORDER — INSULIN ASPART 100 UNIT/ML ~~LOC~~ SOLN
0.0000 [IU] | Freq: Three times a day (TID) | SUBCUTANEOUS | Status: DC
  Administered 2018-04-04 – 2018-04-05 (×3): 3 [IU] via SUBCUTANEOUS

## 2018-04-03 MED ORDER — DIGOXIN 0.0625 MG HALF TABLET
0.0625 mg | ORAL_TABLET | Freq: Every day | ORAL | Status: DC
  Administered 2018-04-04: 0.0625 mg via ORAL
  Filled 2018-04-03: qty 1
  Filled 2018-04-03: qty 0.5

## 2018-04-03 MED ORDER — PANTOPRAZOLE SODIUM 40 MG PO TBEC
40.0000 mg | DELAYED_RELEASE_TABLET | Freq: Every day | ORAL | Status: DC
  Administered 2018-04-04 – 2018-04-05 (×2): 40 mg via ORAL
  Filled 2018-04-03 (×2): qty 1

## 2018-04-03 MED ORDER — ACETAMINOPHEN 650 MG RE SUPP: 650.0000 mg | Freq: Four times a day (QID) | RECTAL | Status: DC | PRN

## 2018-04-03 MED ORDER — APIXABAN 5 MG PO TABS
5.0000 mg | ORAL_TABLET | Freq: Two times a day (BID) | ORAL | Status: DC
  Administered 2018-04-03 – 2018-04-05 (×4): 5 mg via ORAL
  Filled 2018-04-03 (×5): qty 1

## 2018-04-03 MED ORDER — ENSURE ENLIVE PO LIQD
237.0000 mL | Freq: Two times a day (BID) | ORAL | Status: DC
  Administered 2018-04-04 – 2018-04-05 (×3): 237 mL via ORAL

## 2018-04-03 MED ORDER — SODIUM CHLORIDE 0.9 % IV BOLUS
500.0000 mL | Freq: Once | INTRAVENOUS | Status: AC
  Administered 2018-04-03: 500 mL via INTRAVENOUS

## 2018-04-03 MED ORDER — ACETAMINOPHEN 325 MG PO TABS: 650.0000 mg | ORAL_TABLET | Freq: Four times a day (QID) | ORAL | Status: DC | PRN

## 2018-04-03 MED ORDER — CYCLOSPORINE 0.05 % OP EMUL
1.0000 [drp] | Freq: Two times a day (BID) | OPHTHALMIC | Status: DC
  Administered 2018-04-03 – 2018-04-05 (×4): 1 [drp] via OPHTHALMIC
  Filled 2018-04-03 (×4): qty 1

## 2018-04-03 MED ORDER — TRAZODONE HCL 50 MG PO TABS: 50.0000 mg | ORAL_TABLET | Freq: Every evening | ORAL | Status: DC | PRN

## 2018-04-03 MED ORDER — PAROXETINE HCL 20 MG PO TABS
20.0000 mg | ORAL_TABLET | Freq: Every day | ORAL | Status: DC
  Administered 2018-04-04 – 2018-04-05 (×2): 20 mg via ORAL
  Filled 2018-04-03 (×2): qty 1

## 2018-04-03 MED ORDER — AMIODARONE HCL 200 MG PO TABS
200.0000 mg | ORAL_TABLET | Freq: Every day | ORAL | Status: DC
  Administered 2018-04-04 – 2018-04-05 (×2): 200 mg via ORAL
  Filled 2018-04-03 (×2): qty 1

## 2018-04-03 MED ORDER — DIGOXIN 0.0625 MG HALF TABLET: 0.0625 mg | ORAL_TABLET | Freq: Every day | ORAL | Status: DC

## 2018-04-03 MED ORDER — ATORVASTATIN CALCIUM 40 MG PO TABS
80.0000 mg | ORAL_TABLET | Freq: Every day | ORAL | Status: DC
  Administered 2018-04-03 – 2018-04-05 (×3): 80 mg via ORAL
  Filled 2018-04-03 (×3): qty 2

## 2018-04-03 MED ORDER — SODIUM CHLORIDE 0.9 % IV SOLN
INTRAVENOUS | Status: AC
  Administered 2018-04-03: 20:00:00 via INTRAVENOUS

## 2018-04-03 NOTE — H&P (Addendum)
[facility] History and Physical  Michael Krueger KVQ:[id number] DOB: 20-Feb-1956 DOA: 04/03/2018   PCP: Primary care physician is in [place] [facility]: Dr. Stanford Breed is his cardiologist.  Chief Complaint: Lightheadedness  HPI: Michael Krueger is a 62 y.o. male with a past medical history of chronic systolic CHF with a EF of 15% based on echocardiogram in 2018, followed by [facility] cardiology, history of atrial fibrillation on amiodarone and anticoagulation, diagnosed with a small cell lung cancer in March of this year and has completed chemotherapy and radiation in August and October respectively.  Patient presented to his cardiologist office today and was found to be hypotensive with a blood pressure in the 70s.  He mentioned that he has been feeling dizzy and lightheaded for the past few days.  Apparently has had a poor oral intake the last month or so which he attributed to his chemotherapy and radiation treatment.  He has had episodes of vomiting whenever he tries to eat foods.  He denies any difficulty swallowing.  He vomits about 30 to 40 minutes after he has eaten something.  He denies any history of diabetic gastroparesis.  Denies any abdominal pain.  Has had some loose stools recently as well.  Denies any chest pain shortness of breath.  No fever no chills.  No sick contacts.  No weakness in any one side of his body.  In the emergency department patient was initially noted to be hypotensive with blood pressure in the 70s and 80s.  He was given fluids and blood pressure has improved to 90 systolic.  He was found to have acute renal failure.  He will be hospitalized for further management.  Home Medications: Prior to Admission medications   Medication Sig Start Date End Date Taking? Authorizing Provider  acetaminophen (TYLENOL) 500 MG tablet Take 1,000 mg by mouth daily as needed for headache.    Yes [provider]  amiodarone (PACERONE) 200 MG tablet Take 1 tablet (200  mg total) by mouth daily for 77 doses. 01/18/18 04/05/18 Yes Deboraha Sprang, MD  atorvastatin (LIPITOR) 80 MG tablet TAKE 1 TABLET (80 MG TOTAL) BY MOUTH DAILY. 01/17/18  Yes Lelon Perla, MD  carvedilol (COREG) 12.5 MG tablet TAKE 1 TABLET (12.5 MG TOTAL) BY MOUTH 2 (TWO) TIMES DAILY. 02/22/18  Yes Lelon Perla, MD  cycloSPORINE (RESTASIS) 0.05 % ophthalmic emulsion Place 1 drop into both eyes 2 (two) times daily. 10/16/17  Yes [provider]  digoxin (LANOXIN) 0.125 MG tablet TAKE 1/2 TABLET BY MOUTH DAILY Patient taking differently: Take 0.0625 mg by mouth daily.  01/29/18  Yes Crenshaw, Denice Bors, MD  ELIQUIS 5 MG TABS tablet TAKE 1 TABLET BY MOUTH TWICE A DAY Patient taking differently: Take 5 mg by mouth 2 (two) times daily.  02/19/18  Yes Crenshaw, Denice Bors, MD  ENTRESTO 24-26 MG TAKE 1 TABLET BY MOUTH 2 (TWO) TIMES DAILY. Patient taking differently: Take 1 tablet by mouth 2 (two) times daily.  08/10/17  Yes Lelon Perla, MD  furosemide (LASIX) 80 MG tablet TAKE 1 TABLET BY MOUTH TWICE A DAY Patient taking differently: Take 80 mg by mouth 2 (two) times daily.  11/29/17  Yes Lelon Perla, MD  metFORMIN (GLUCOPHAGE) 850 MG tablet Take 850 mg by mouth daily.  07/06/15  Yes [provider]  ondansetron (ZOFRAN) 8 MG tablet Take 8 mg by mouth every 8 (eight) hours as needed for nausea or vomiting.   Yes [provider]  pantoprazole (PROTONIX) 40 MG tablet Take 1 tablet by mouth Daily. 07/11/11  Yes [provider]  PARoxetine (PAXIL) 20 MG tablet Take 20 mg by mouth daily.   Yes [provider]  potassium chloride (KLOR-CON) 20 MEQ packet Take 20 mEq by mouth daily.   Yes [provider]  traZODone (DESYREL) 50 MG tablet Take 50 mg by mouth daily as needed for sleep.  08/06/13  Yes [provider]    Allergies:  Allergies  Allergen Reactions  . Other Itching and Other (See Comments)    CHG wipes/  Caused a rash     Past Medical History: Past Medical History:  Diagnosis Date  . Atrial fibrillation ([place])   . AV BLOCK, COMPLETE   . CAD   . Chronic systolic heart failure ([place])   . Gynecomastia   . HYPERLIPIDEMIA-MIXED   . HYPERTHYROIDISM   . Implantable cardiac defibrillator  medtronic    Initially related 1990s, upgrade to dual-chamber 2002, generator change 2005, generator change August 2010, hematoma evacuation September 2010.  . Ischemic cardiomyopathy   . Long term current use of anticoagulant   . OBESITY-MORBID (>100')   . TRANSAMINASES, SERUM, ELEVATED   . VENTRICULAR TACHYCARDIA    S/P RFCA x3  2006, 2010,2010    Past Surgical History:  Procedure Laterality Date  . IMPLANTABLE CARDIOVERTER DEFIBRILLATOR GENERATOR CHANGE N/A 10/23/2013   Procedure: IMPLANTABLE CARDIOVERTER DEFIBRILLATOR GENERATOR CHANGE;  Surgeon: Deboraha Sprang, MD;  Location: [facility] CATH LAB;  Service: Cardiovascular;  Laterality: N/A;  . SPLENECTOMY    . Status post Medtronic Concerto CRT-D in August 2010 with pocket revision      Social History: Lives in [place] by himself.  Smokes about 7 to 8 cigarettes/day.  Denies any alcohol use or illicit drug use.  Usually independent with daily activities.  Family History:  Family History  Problem Relation Age of Onset  . Cancer Mother   . Heart attack Maternal Grandfather   . Stroke Paternal Grandfather   . Hypertension Sister      Review of Systems - History obtained from the patient General ROS: positive for  - fatigue Psychological ROS: negative Ophthalmic ROS: negative ENT ROS: negative Allergy and Immunology ROS: negative Hematological and Lymphatic ROS: negative Endocrine ROS: negative Respiratory ROS: no cough, shortness of breath, or wheezing Cardiovascular ROS: no chest pain or dyspnea on exertion Gastrointestinal ROS: as in hpi Genito-Urinary ROS: Feels like he has to use a lot of pressure to urinate Musculoskeletal ROS:  negative Neurological ROS: no TIA or stroke symptoms Dermatological ROS: negative  Physical Examination  Vitals:   04/03/18 1323 04/03/18 1400 04/03/18 1454 04/03/18 1716  BP: 109/76 103/70 (!) 88/64 105/70  Pulse: 61 61 70 62  Resp:  10 17 16   Temp: 98.1 F (36.7 C)     TempSrc: Oral     SpO2: 98% 97% 97% 96%    BP 105/70 (BP Location: Right Arm)   Pulse 62   Temp 98.1 F (36.7 C) (Oral)   Resp 16   SpO2 96%   General appearance: alert, cooperative, appears stated age and no distress Head: Normocephalic, without obvious abnormality, atraumatic Eyes: conjunctivae/corneas clear. PERRL, EOM's intact.  Throat: lips, mucosa, and tongue normal; teeth and gums normal Neck: no adenopathy, no carotid bruit, no JVD, supple, symmetrical, trachea midline and thyroid not enlarged, symmetric, no tenderness/mass/nodules Resp: clear to auscultation bilaterally Cardio: regular rate and rhythm, S1, S2 normal, no murmur, click, rub or gallop  GI: Abdomen is soft.  Nontender.  Ventral hernia is present without any tenderness.  Bowel sounds are present.  No obvious masses organomegaly. Extremities: extremities normal, atraumatic, no cyanosis or edema Pulses: 2+ and symmetric Skin: Skin color, texture, turgor normal. No rashes or lesions Lymph nodes: Cervical, supraclavicular, and axillary nodes normal. Neurologic: Alert and oriented x3.  Cranial nerves II through XII intact.  Motor strength equal bilateral upper and lower extremities.   Labs on Admission: I have personally reviewed following labs and imaging studies  CBC: Recent Labs  Lab 04/03/18 1336  WBC 8.0  NEUTROABS 6.2  HGB 13.8  HCT 40.0  MCV 98.8  PLT 124   Basic Metabolic Panel: Recent Labs  Lab 04/03/18 1336  NA 137  K 3.7  CL 98  CO2 28  GLUCOSE 101*  BUN 25*  CREATININE 2.27*  CALCIUM 8.8*   GFR: Estimated Creatinine Clearance: 28.3 mL/min (A) (by C-G formula based on SCr of 2.27 mg/dL (H)). Liver Function  Tests: Recent Labs  Lab 04/03/18 1336  AST 41  ALT 26  ALKPHOS 75  BILITOT 1.0  PROT 7.2  ALBUMIN 3.8     Radiological Exams on Admission: No results found.   Problem List  Principal Problem:   ARF (acute renal failure) (HCC) Active Problems:   ATRIAL FIBRILLATION   SYSTOLIC HEART FAILURE, CHRONIC   Automatic implantable cardioverter-defibrillator in situ   Hypotension   Nausea and vomiting   SCLC (small cell lung carcinoma) (HCC)   Assessment: This is a 62 year old Caucasian male with a past medical history as stated earlier who presents with lightheadedness and found to be hypotensive.  He is afebrile.  No obvious infectious source is identified.  WBC is normal.  Hypotension most likely due to hypovolemia.  He has had poor oral intake for the last many weeks.  He has had some episodes of diarrhea and nausea and vomiting.  Plus he is on diuretics at home.  Plan:  #1 Hypotension likely secondary to hypovolemia: He has been given fluid bolus in the ED.  He will be gently hydrated through the night.  Using caution due to history of CHF.  Recheck his labs tomorrow.  Check orthostatics in the morning.  Monitor blood pressures closely.  Hold his blood pressure lowering agents.  #2  Acute renal failure: Most likely due to hypovolemia.  We will gently hydrate him.  Monitor urine output.  Recheck his labs tomorrow.  If there is no improvement consider renal ultrasound.  #3 Nausea vomiting and occasional episodes of diarrhea: He tells me that he has had vomiting ever since his chemo and radiation.  His abdomen is benign on examination.  Care everywhere was reviewed.  He had a CT scan back in August which did not show any acute abnormalities or other concerning findings in the abdomen.  We will check acute abdominal series.  Give him a diet and see what happens.  He may need more imaging studies if he continues to have symptoms.  #4  Chronic systolic CHF: EF noted to be 15% status post  AICD.  Patient is noted to be on carvedilol, Entresto and diuretics.  This will be held for now.  Hold all of his other blood pressure lowering agents as well.  He was seen in cardiology office this morning.  According to that note cardiology will round on the patient tomorrow.  #5  History of atrial fibrillation: Continue amiodarone.  Continue apixaban.  #6 history of coronary  artery disease and ischemic cardiomyopathy: Stable.  Denies any chest pain.  #7  History of ventricular tachycardia: Continue amiodarone  #8  History of diabetes mellitus type 2: Hold his metformin.  Put him on sliding scale coverage.  #9 Small cell lung cancer: Followed by oncology at [facility].  Underwent chemoradiation.  Chemotherapy was completed in August and radiation treatments were completed end of October.   DVT Prophylaxis: On apixaban Code Status: Full code Family Communication: Discussed with the patient Consults called: None  Severity of Illness: The appropriate patient status for this patient is OBSERVATION. Observation status is judged to be reasonable and necessary in order to provide the required intensity of service to ensure the patient's safety. The patient's presenting symptoms, physical exam findings, and initial radiographic and laboratory data in the context of their medical condition is felt to place them at decreased risk for further clinical deterioration. Furthermore, it is anticipated that the patient will be medically stable for discharge from the hospital within 2 midnights of admission. The following factors support the patient status of observation.   " The patient's presenting symptoms include lightheadedness. " The physical exam findings include hypotension. " The initial radiographic and laboratory data are remarkable for mild acute renal failure   Further management decisions will depend on results of further testing and patient's response to treatment.   Bonnielee Haff  [facility] Pager [phone]  If 7PM-7AM, please contact night-coverage [email or web address] Password [facility]  04/03/2018, 5:58 PM

## 2018-04-03 NOTE — ED Provider Notes (Signed)
[place] DEPT Provider Note   CSN: [id number] Arrival date & time: 04/03/18  1245     History   Chief Complaint No chief complaint on file.   HPI Michael Krueger is a 61 y.o. male who presents with hypotension and lightheadedness. 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 He states that he had chemo in November and felt okay. On 11/30 he had radiation and since then he has had nausea, vomiting and decreased appetite. He has Zofran but doesn't take it because he read it interacts with Amiodarone. He went to his cardiologist's (Dr. Stanford Breed) office today for a routine visit. His blood pressure was noted to be 70/50. His baseline appears to be 027-741 systolic. He feels lightheaded and generally weak but hasn't passed out. He denies headache, chest pain, SOB, abdominal pain. He has not had any blood in the stool. Dr. Stanford Breed recommended admission for gentle hydration, and holding Lasix, potassium, Entresto and carvedilol. The patient doesn't particularly want to stay because he has 6 dogs and 19 cats to care for. EMS gave 500cc bolus which improved his BP to 28N systolic.  HPI  Past Medical History:  Diagnosis Date  . Atrial fibrillation ([place])   . AV BLOCK, COMPLETE   . CAD   . Chronic systolic heart failure ([place])   . Gynecomastia   . HYPERLIPIDEMIA-MIXED   . HYPERTHYROIDISM   . Implantable cardiac defibrillator  medtronic    Initially related 1990s, upgrade to dual-chamber 2002, generator change 2005, generator change August 2010, hematoma evacuation September 2010.  . Ischemic cardiomyopathy   . Long term current use of anticoagulant   . OBESITY-MORBID (>100')   . TRANSAMINASES, SERUM, ELEVATED   . VENTRICULAR TACHYCARDIA    S/P RFCA x3  2006, 2010,2010    Patient Active Problem List   Diagnosis Date Noted  . Breast enlargement 08/17/2012  .  Automatic implantable cardioverter-defibrillator in situ   . Ischemic cardiomyopathy   . Long term current use of anticoagulant 07/23/2010  . Coronary atherosclerosis 09/07/2009  . AV BLOCK, COMPLETE 12/01/2008  . SYSTOLIC HEART FAILURE, CHRONIC 11/26/2008  . HYPERTHYROIDISM 07/18/2008  . HYPERLIPIDEMIA-MIXED 07/18/2008  . OBESITY-MORBID (>100') 07/18/2008  . VENTRICULAR TACHYCARDIA 07/18/2008  . ATRIAL FIBRILLATION 07/18/2008    Past Surgical History:  Procedure Laterality Date  . IMPLANTABLE CARDIOVERTER DEFIBRILLATOR GENERATOR CHANGE N/A 10/23/2013   Procedure: IMPLANTABLE CARDIOVERTER DEFIBRILLATOR GENERATOR CHANGE;  Surgeon: Deboraha Sprang, MD;  Location: [facility] CATH LAB;  Service: Cardiovascular;  Laterality: N/A;  . SPLENECTOMY    . Status post Medtronic Concerto CRT-D in August 2010 with pocket revision          Home Medications    Prior to Admission medications   Medication Sig Start Date End Date Taking? Authorizing Provider  acetaminophen (TYLENOL) 500 MG tablet Take 1,000 mg by mouth daily as needed for headache.     [provider]  amiodarone (PACERONE) 200 MG tablet Take 1 tablet (200 mg total) by mouth daily for 77 doses. 01/18/18 04/05/18  Deboraha Sprang, MD  atorvastatin (LIPITOR) 80 MG tablet TAKE 1 TABLET (80 MG TOTAL) BY MOUTH DAILY. 01/17/18   Lelon Perla, MD  carvedilol (COREG) 12.5 MG tablet TAKE 1 TABLET (12.5 MG TOTAL) BY MOUTH 2 (TWO) TIMES DAILY. 02/22/18   Lelon Perla, MD  cycloSPORINE (RESTASIS) 0.05 % ophthalmic emulsion Place 1 drop into both eyes  2 (two) times daily. 10/16/17   [provider]  digoxin (LANOXIN) 0.125 MG tablet TAKE 1/2 TABLET BY MOUTH DAILY 01/29/18   Crenshaw, Denice Bors, MD  ELIQUIS 5 MG TABS tablet TAKE 1 TABLET BY MOUTH TWICE A DAY 02/19/18   Crenshaw, Denice Bors, MD  ENTRESTO 24-26 MG TAKE 1 TABLET BY MOUTH 2 (TWO) TIMES DAILY. 08/10/17   Lelon Perla, MD  furosemide (LASIX) 80 MG tablet TAKE 1 TABLET BY  MOUTH TWICE A DAY 11/29/17   Lelon Perla, MD  metFORMIN (GLUCOPHAGE) 850 MG tablet Take 850 mg by mouth daily.  07/06/15   [provider]  ondansetron (ZOFRAN) 8 MG tablet Take 8 mg by mouth every 8 (eight) hours as needed for nausea or vomiting.    [provider]  pantoprazole (PROTONIX) 40 MG tablet Take 1 tablet by mouth Daily. 07/11/11   [provider]  PARoxetine (PAXIL) 20 MG tablet Take 20 mg by mouth daily.    [provider]  potassium chloride (KLOR-CON) 20 MEQ packet Take 20 mEq by mouth daily.    [provider]  traZODone (DESYREL) 50 MG tablet Take 25-100 mg by mouth daily as needed for sleep.  08/06/13   [provider]    Family History Family History  Problem Relation Age of Onset  . Cancer Mother   . Heart attack Maternal Grandfather   . Stroke Paternal Grandfather   . Hypertension Sister     Social History Social History   Tobacco Use  . Smoking status: Former Research scientist (life sciences)  . Smokeless tobacco: Never Used  Substance Use Topics  . Alcohol use: Yes  . Drug use: No     Allergies   Other   Review of Systems Review of Systems  Constitutional: Negative for chills and fever.  Respiratory: Negative for cough and shortness of breath.   Cardiovascular: Negative for chest pain.  Gastrointestinal: Positive for nausea and vomiting. Negative for abdominal pain and constipation.  Genitourinary: Negative for flank pain.  Neurological: Positive for weakness and light-headedness. Negative for syncope.  All other systems reviewed and are negative.    Physical Exam Updated Vital Signs There were no vitals taken for this visit.  Physical Exam  Constitutional: He is oriented to person, place, and time. He appears well-developed and well-nourished. No distress.  HENT:  Head: Normocephalic and atraumatic.  Eyes: Pupils are equal, round, and reactive to light. Conjunctivae are normal. Right eye exhibits no discharge.  Left eye exhibits no discharge. No scleral icterus.  Normal appearing conjunctiva  Neck: Normal range of motion.  Cardiovascular: Normal rate and regular rhythm.  Pulmonary/Chest: Effort normal and breath sounds normal. No respiratory distress.  Abdominal: Soft. Bowel sounds are normal. He exhibits no distension and no mass. There is no tenderness. There is no rebound and no guarding. A hernia (umbilical hernia which is reducible) is present.  Prior surgical scar from splenectomy  Neurological: He is alert and oriented to person, place, and time.  Skin: Skin is warm and dry.  Psychiatric: He has a normal mood and affect. His behavior is normal.  Nursing note and vitals reviewed.    ED Treatments / Results  Labs (all labs ordered are listed, but only abnormal results are displayed) Labs Reviewed  CBC WITH DIFFERENTIAL/PLATELET - Abnormal; Notable for the following components:      Result Value   RBC 4.05 (*)    MCH 34.1 (*)    RDW 15.8 (*)  Monocytes Absolute 1.1 (*)    All other components within normal limits  COMPREHENSIVE METABOLIC PANEL - Abnormal; Notable for the following components:   Glucose, Bld 101 (*)    BUN 25 (*)    Creatinine, Ser 2.27 (*)    Calcium 8.8 (*)    GFR calc non Af Amer 30 (*)    GFR calc Af Amer 35 (*)    All other components within normal limits    EKG None  Radiology No results found.  Procedures Procedures (including critical care time)  Medications Ordered in ED Medications  sodium chloride 0.9 % bolus 500 mL (0 mLs Intravenous Stopped 04/03/18 1536)     Initial Impression / Assessment and Plan / ED Course  I have reviewed the triage vital signs and the nursing notes.  Pertinent labs & imaging results that were available during my care of the patient were reviewed by me and considered in my medical decision making (see chart for details).  62 year old male presents with lightheadedness and hypotension. BP is initially 70/50 at his  cardiologists office. It has improved to 43-200 systolic here with 1L fluids. He still becomes orthostatic with standing however. Also, labs are notable for AKI. SCr in August was 1.1 and is 2.2 today. Otherwise labs are unremarkable. Shared visit with Dr. Wilson Singer. Will admit for further management. Discussed with Dr. [place] Pink who will admit.  Final Clinical Impressions(s) / ED Diagnoses   Final diagnoses:  Hypotension due to hypovolemia  AKI (acute kidney injury) [facility])    ED Discharge Orders    None       Recardo Evangelist, PA-C 04/03/18 1730    Virgel Manifold, MD 04/04/18 [phone]

## 2018-04-03 NOTE — ED Notes (Signed)
ED TO INPATIENT HANDOFF REPORT  Name/Age/Gender Renato Battles 62 y.o. male  Code Status Code Status History    Date Active Date Inactive Code Status Order ID Comments User Context   10/23/2013 1037 10/23/2013 1532 Full Code [id number]  Deboraha Sprang, MD Inpatient      Home/SNF/Other Home  Chief Complaint hypotension   Level of Care/Admitting Diagnosis ED Disposition    ED Disposition Condition [facility] Area: [facility] [[id number]]  Level of Care: Telemetry [5]  Admit to tele based on following criteria: Monitor for Ischemic changes  Diagnosis: ARF (acute renal failure) [facility]) [093235]  Admitting Physician: Bonnielee Haff [[id number]]  Attending Physician: Bonnielee Haff [[id number]]  PT Class (Do Not Modify): Observation [104]  PT Acc Code (Do Not Modify): Observation [10022]       Medical History Past Medical History:  Diagnosis Date  . Atrial fibrillation ([place])   . AV BLOCK, COMPLETE   . CAD   . Chronic systolic heart failure ([place])   . Gynecomastia   . HYPERLIPIDEMIA-MIXED   . HYPERTHYROIDISM   . Implantable cardiac defibrillator  medtronic    Initially related 1990s, upgrade to dual-chamber 2002, generator change 2005, generator change August 2010, hematoma evacuation September 2010.  . Ischemic cardiomyopathy   . Long term current use of anticoagulant   . OBESITY-MORBID (>100')   . TRANSAMINASES, SERUM, ELEVATED   . VENTRICULAR TACHYCARDIA    S/P RFCA x3  2006, 2010,2010    Allergies Allergies  Allergen Reactions  . Other Itching and Other (See Comments)    CHG wipes/  Caused a rash    IV Location/Drains/Wounds Patient Lines/Drains/Airways Status   Active Line/Drains/Airways    Name:   Placement date:   Placement time:   Site:   Days:   Peripheral IV 04/03/18 Left Forearm   04/03/18    -    Forearm   less than 1          Labs/Imaging Results for orders placed or performed during the hospital encounter of 04/03/18  (from the past 48 hour(s))  CBC with Differential     Status: Abnormal   Collection Time: 04/03/18  1:36 PM  Result Value Ref Range   WBC 8.0 4.0 - 10.5 K/uL   RBC 4.05 (L) 4.22 - 5.81 MIL/uL   Hemoglobin 13.8 13.0 - 17.0 g/dL   HCT 40.0 39.0 - 52.0 %   MCV 98.8 80.0 - 100.0 fL   MCH 34.1 (H) 26.0 - 34.0 pg   MCHC 34.5 30.0 - 36.0 g/dL   RDW 15.8 (H) 11.5 - 15.5 %   Platelets 195 150 - 400 K/uL   nRBC 0.0 0.0 - 0.2 %   Neutrophils Relative % 77 %   Neutro Abs 6.2 1.7 - 7.7 K/uL   Lymphocytes Relative 9 %   Lymphs Abs 0.7 0.7 - 4.0 K/uL   Monocytes Relative 13 %   Monocytes Absolute 1.1 (H) 0.1 - 1.0 K/uL   Eosinophils Relative 0 %   Eosinophils Absolute 0.0 0.0 - 0.5 K/uL   Basophils Relative 0 %   Basophils Absolute 0.0 0.0 - 0.1 K/uL   Immature Granulocytes 1 %   Abs Immature Granulocytes 0.04 0.00 - 0.07 K/uL    Comment: Performed at [facility], [place] [address], [place], [place] [address]  Comprehensive metabolic panel     Status: Abnormal   Collection Time: 04/03/18  1:36 PM  Result Value Ref  Range   Sodium 137 135 - 145 mmol/L   Potassium 3.7 3.5 - 5.1 mmol/L   Chloride 98 98 - 111 mmol/L   CO2 28 22 - 32 mmol/L   Glucose, Bld 101 (H) 70 - 99 mg/dL   BUN 25 (H) 8 - 23 mg/dL   Creatinine, Ser 2.27 (H) 0.61 - 1.24 mg/dL   Calcium 8.8 (L) 8.9 - 10.3 mg/dL   Total Protein 7.2 6.5 - 8.1 g/dL   Albumin 3.8 3.5 - 5.0 g/dL   AST 41 15 - 41 U/L   ALT 26 0 - 44 U/L   Alkaline Phosphatase 75 38 - 126 U/L   Total Bilirubin 1.0 0.3 - 1.2 mg/dL   GFR calc non Af Amer 30 (L) >60 mL/min   GFR calc Af Amer 35 (L) >60 mL/min   Anion gap 11 5 - 15    Comment: Performed at [facility], [place] [address] [place], [place] [address]   No results found. None  Pending Labs Unresulted Labs (From admission, onward)    Start     Ordered   04/03/18 1812  Urinalysis, Routine w reflex microscopic  Once,   R     04/03/18 1811   Signed and Held  HIV  antibody (Routine Testing)  Tomorrow morning,   R     Signed and Held   Signed and Held  Comprehensive metabolic panel  Tomorrow morning,   R     Signed and Held   Signed and Held  CBC  Tomorrow morning,   R     Signed and Held          Vitals/Pain Today's Vitals   04/03/18 1323 04/03/18 1400 04/03/18 1454 04/03/18 1716  BP: 109/76 103/70 (!) 88/64 105/70  Pulse: 61 61 70 62  Resp:  10 17 16   Temp: 98.1 F (36.7 C)     TempSrc: Oral     SpO2: 98% 97% 97% 96%  PainSc:        Isolation Precautions No active isolations  Medications Medications  sodium chloride 0.9 % bolus 500 mL (0 mLs Intravenous Stopped 04/03/18 1536)    Mobility walks

## 2018-04-03 NOTE — ED Notes (Signed)
Bed: WA07 Expected date:  Expected time:  Means of arrival:  Comments: 62yo hypotension 70/50, 90/60 from MD office ROOM 7

## 2018-04-03 NOTE — ED Notes (Signed)
Pt used restroom before UA was ordered

## 2018-04-03 NOTE — ED Triage Notes (Signed)
Patient coming from [facility]. Patient presented to ed with c/o hypotension  BP 70/50 at the heart clinic. Vital sign per ems 96/55. 98/68 and 96/60. Patient received 500 ml of normal saline per ems.

## 2018-04-04 ENCOUNTER — Observation Stay (HOSPITAL_COMMUNITY): Payer: Medicare PPO

## 2018-04-04 DIAGNOSIS — N179 Acute kidney failure, unspecified: Secondary | ICD-10-CM | POA: Diagnosis not present

## 2018-04-04 DIAGNOSIS — I9589 Other hypotension: Secondary | ICD-10-CM

## 2018-04-04 DIAGNOSIS — E861 Hypovolemia: Secondary | ICD-10-CM | POA: Diagnosis not present

## 2018-04-04 LAB — COMPREHENSIVE METABOLIC PANEL
ALK PHOS: 66 U/L (ref 38–126)
ALT: 23 U/L (ref 0–44)
AST: 31 U/L (ref 15–41)
Albumin: 3.3 g/dL — ABNORMAL LOW (ref 3.5–5.0)
Anion gap: 8 (ref 5–15)
BUN: 21 mg/dL (ref 8–23)
CO2: 30 mmol/L (ref 22–32)
Calcium: 8.7 mg/dL — ABNORMAL LOW (ref 8.9–10.3)
Chloride: 102 mmol/L (ref 98–111)
Creatinine, Ser: 1.84 mg/dL — ABNORMAL HIGH (ref 0.61–1.24)
GFR calc Af Amer: 45 mL/min — ABNORMAL LOW (ref >60)
GFR calc non Af Amer: 38 mL/min — ABNORMAL LOW (ref >60)
Glucose, Bld: 94 mg/dL (ref 70–99)
Potassium: 3.1 mmol/L — ABNORMAL LOW (ref 3.5–5.1)
Sodium: 140 mmol/L (ref 135–145)
Total Bilirubin: 0.9 mg/dL (ref 0.3–1.2)
Total Protein: 6.1 g/dL — ABNORMAL LOW (ref 6.5–8.1)

## 2018-04-04 LAB — URINALYSIS, ROUTINE W REFLEX MICROSCOPIC
Bacteria, UA: NONE SEEN
Bilirubin Urine: NEGATIVE
Glucose, UA: NEGATIVE mg/dL
KETONES UR: NEGATIVE mg/dL
Leukocytes, UA: NEGATIVE
Nitrite: NEGATIVE
PH: 5 (ref 5.0–8.0)
Protein, ur: NEGATIVE mg/dL
Specific Gravity, Urine: 1.009 (ref 1.005–1.030)

## 2018-04-04 LAB — GLUCOSE, CAPILLARY
GLUCOSE-CAPILLARY: 151 mg/dL — AB (ref 70–99)
Glucose-Capillary: 110 mg/dL — ABNORMAL HIGH (ref 70–99)
Glucose-Capillary: 94 mg/dL (ref 70–99)
Glucose-Capillary: 165 mg/dL — ABNORMAL HIGH (ref 70–99)
Glucose-Capillary: 167 mg/dL — ABNORMAL HIGH (ref 70–99)

## 2018-04-04 LAB — CBC
HCT: 38.6 % — ABNORMAL LOW (ref 39.0–52.0)
Hemoglobin: 13.3 g/dL (ref 13.0–17.0)
MCH: 33.3 pg (ref 26.0–34.0)
MCHC: 34.5 g/dL (ref 30.0–36.0)
MCV: 96.7 fL (ref 80.0–100.0)
Platelets: 194 10*3/uL (ref 150–400)
RBC: 3.99 MIL/uL — ABNORMAL LOW (ref 4.22–5.81)
RDW: 15.8 % — ABNORMAL HIGH (ref 11.5–15.5)
WBC: 5.9 10*3/uL (ref 4.0–10.5)
nRBC: 0 % (ref 0.0–0.2)

## 2018-04-04 LAB — HIV ANTIBODY (ROUTINE TESTING W REFLEX): HIV Screen 4th Generation wRfx: NONREACTIVE

## 2018-04-04 MED ORDER — SODIUM CHLORIDE 0.9 % IV BOLUS
250.0000 mL | Freq: Once | INTRAVENOUS | Status: AC
  Administered 2018-04-04: 250 mL via INTRAVENOUS

## 2018-04-04 MED ORDER — POTASSIUM CHLORIDE CRYS ER 20 MEQ PO TBCR
40.0000 meq | EXTENDED_RELEASE_TABLET | ORAL | Status: AC
  Administered 2018-04-04 (×2): 40 meq via ORAL
  Filled 2018-04-04 (×2): qty 2

## 2018-04-04 MED ORDER — SODIUM CHLORIDE 0.9 % IV SOLN
INTRAVENOUS | Status: DC
  Administered 2018-04-04 – 2018-04-05 (×2): via INTRAVENOUS

## 2018-04-04 NOTE — Consult Note (Addendum)
Cardiology Consultation:   Patient ID: MUHSIN DORIS MRN: [id number]; DOB: 07/12/1955  Admit date: 04/03/2018 Date of Consult: 04/04/2018  Primary Care Provider: System, Pcp Not In Primary Cardiologist: Kirk Ruths, MD  Primary Electrophysiologist:  [name], MD    Patient Profile:   Mr. Hubbert is a 62 y/o male with a h/o CAD, ischemic cardiomyopathy, chronic systolic HF w/ EF of 37%, s/p BiV-ICD, V. 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 Cardiology consulted for management of chronic systolic heart failure, at the request of Dr. Florene Glen, Internal Medicine.    History of Present Illness:   Mr. Cumbie is a 62 y/o male with a h/o CAD, ischemic cardiomyopathy, chronic systolic HF w/ EF of 85%, s/p BiV-ICD, V. 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 Cardiology consulted for management of chronic systolic heart failure, at the request of Dr. Florene Glen, Internal Medicine.    Pt was seen in clinic yesterday by Dr. Stanford Breed and endorsed post prandial nausea vomiting and diarrhea + decreased PO intake over the course of several weeks. He also described weakness and dizziness w/ standing but no syncope. He also denied CP, dyspnea and palpitations. In clinic, he was found to be hypotensive w/ SBP in the 70s. Dr. Stanford Breed noted that there was no signs of volume overload on his examination. He recommended transfer to [facility] hospital for admission under hospitalist service for gentle hydration and further observation. Admission labs notable for AKI. Scr elevated at 2.27. Baseline is 1.1. K normal at 3.7. His home HF/  antihypertensive meds were held (Lasix, Entresto and Corge). He was treated with IVFs. Scr improved today at 1.8. BP improved but still soft at 97/66. He still feels weak and nauseated. No syncope.     Past Medical History:  Diagnosis Date  . Atrial fibrillation ([place])   . AV BLOCK, COMPLETE   . CAD   . Chronic systolic heart failure ([place])   . Gynecomastia   . HYPERLIPIDEMIA-MIXED   . HYPERTHYROIDISM   . Implantable cardiac defibrillator  medtronic    Initially related 1990s, upgrade to dual-chamber 2002, generator change 2005, generator change August 2010, hematoma evacuation September 2010.  . Ischemic cardiomyopathy   . Long term current use of anticoagulant   . OBESITY-MORBID (>100')   . TRANSAMINASES, SERUM, ELEVATED   . VENTRICULAR TACHYCARDIA    S/P RFCA x3  2006, 2010,2010    Past Surgical History:  Procedure Laterality Date  . IMPLANTABLE CARDIOVERTER DEFIBRILLATOR GENERATOR CHANGE N/A 10/23/2013   Procedure: IMPLANTABLE CARDIOVERTER DEFIBRILLATOR GENERATOR CHANGE;  Surgeon: Deboraha Sprang, MD;  Location: [facility] CATH LAB;  Service: Cardiovascular;  Laterality: N/A;  . SPLENECTOMY    . Status post Medtronic Concerto CRT-D in August 2010 with pocket revision       Home Medications:  Prior to Admission medications   Medication Sig Start Date End Date Taking? Authorizing Provider  acetaminophen (TYLENOL) 500 MG tablet Take 1,000 mg by mouth daily as needed for headache.    Yes [provider]  amiodarone (PACERONE) 200 MG tablet Take 1 tablet (200 mg total) by mouth daily for 77 doses. 01/18/18 04/05/18 Yes Deboraha Sprang, MD  atorvastatin (LIPITOR) 80 MG tablet TAKE 1  TABLET (80 MG TOTAL) BY MOUTH DAILY. 01/17/18  Yes Lelon Perla, MD  carvedilol (COREG) 12.5 MG tablet TAKE 1 TABLET (12.5 MG TOTAL) BY MOUTH 2 (TWO) TIMES DAILY. 02/22/18  Yes Lelon Perla, MD  cycloSPORINE (RESTASIS) 0.05 % ophthalmic emulsion Place 1 drop into both eyes 2 (two) times daily.  10/16/17  Yes [provider]  digoxin (LANOXIN) 0.125 MG tablet TAKE 1/2 TABLET BY MOUTH DAILY Patient taking differently: Take 0.0625 mg by mouth daily.  01/29/18  Yes Crenshaw, Denice Bors, MD  ELIQUIS 5 MG TABS tablet TAKE 1 TABLET BY MOUTH TWICE A DAY Patient taking differently: Take 5 mg by mouth 2 (two) times daily.  02/19/18  Yes Crenshaw, Denice Bors, MD  ENTRESTO 24-26 MG TAKE 1 TABLET BY MOUTH 2 (TWO) TIMES DAILY. Patient taking differently: Take 1 tablet by mouth 2 (two) times daily.  08/10/17  Yes Lelon Perla, MD  furosemide (LASIX) 80 MG tablet TAKE 1 TABLET BY MOUTH TWICE A DAY Patient taking differently: Take 80 mg by mouth 2 (two) times daily.  11/29/17  Yes Lelon Perla, MD  metFORMIN (GLUCOPHAGE) 850 MG tablet Take 850 mg by mouth daily.  07/06/15  Yes [provider]  ondansetron (ZOFRAN) 8 MG tablet Take 8 mg by mouth every 8 (eight) hours as needed for nausea or vomiting.   Yes [provider]  pantoprazole (PROTONIX) 40 MG tablet Take 1 tablet by mouth Daily. 07/11/11  Yes [provider]  PARoxetine (PAXIL) 20 MG tablet Take 20 mg by mouth daily.   Yes [provider]  potassium chloride (KLOR-CON) 20 MEQ packet Take 20 mEq by mouth daily.   Yes [provider]  traZODone (DESYREL) 50 MG tablet Take 50 mg by mouth daily as needed for sleep.  08/06/13  Yes [provider]    Inpatient Medications: Scheduled Meds: . amiodarone  200 mg Oral Daily  . apixaban  5 mg Oral BID  . atorvastatin  80 mg Oral Daily  . cycloSPORINE  1 drop Both Eyes BID  . digoxin  0.0625 mg Oral Daily  . feeding supplement (ENSURE ENLIVE)  237 mL Oral BID BM  . insulin aspart  0-15 Units Subcutaneous TID WC  . pantoprazole  40 mg Oral Daily  . PARoxetine  20 mg Oral Daily  . potassium chloride  40 mEq Oral Q4H   Continuous Infusions:  PRN Meds: acetaminophen **OR** acetaminophen, traZODone  Allergies:    Allergies  Allergen  Reactions  . Other Itching and Other (See Comments)    CHG wipes/  Caused a rash    Social History:   Social History   Socioeconomic History  . Marital status: Single    Spouse name: Not on file  . Number of children: Not on file  . Years of education: Not on file  . Highest education level: Not on file  Occupational History  . Not on file  Social Needs  . Financial resource strain: Not on file  . Food insecurity:    Worry: Not on file    Inability: Not on file  . Transportation needs:    Medical: Not on file    Non-medical: Not on file  Tobacco Use  . Smoking status: Former Research scientist (life sciences)  . Smokeless tobacco: Never Used  Substance and Sexual Activity  . Alcohol use: Yes  . Drug use: No  . Sexual activity: Not on file  Lifestyle  . Physical activity:  Days per week: Not on file    Minutes per session: Not on file  . Stress: Not on file  Relationships  . Social connections:    Talks on phone: Not on file    Gets together: Not on file    Attends religious service: Not on file    Active member of club or organization: Not on file    Attends meetings of clubs or organizations: Not on file    Relationship status: Not on file  . Intimate partner violence:    Fear of current or ex partner: Not on file    Emotionally abused: Not on file    Physically abused: Not on file    Forced sexual activity: Not on file  Other Topics Concern  . Not on file  Social History Narrative  . Not on file    Family History:    Family History  Problem Relation Age of Onset  . Cancer Mother   . Heart attack Maternal Grandfather   . Stroke Paternal Grandfather   . Hypertension Sister      ROS:  Please see the history of present illness.   All other ROS reviewed and negative.     Physical Exam/Data:   Vitals:   04/03/18 1716 04/03/18 1954 04/04/18 0040 04/04/18 0529  BP: 105/70 104/72 96/67 97/66   Pulse: 62 60  61  Resp: 16 (!) 9  (!) 8  Temp:  98.8 F (37.1 C)  98.4 F (36.9  C)  TempSrc:  Oral  Oral  SpO2: 96% 97%  93%  Weight:  66.6 kg    Height:  5\' 2"  (1.575 m)      Intake/Output Summary (Last 24 hours) at 04/04/2018 0840 Last data filed at 04/04/2018 0200 Gross per 24 hour  Intake 1341.03 ml  Output 400 ml  Net 941.03 ml   Filed Weights   04/03/18 1954  Weight: 66.6 kg   Body mass index is 26.85 kg/m.  General:  Well nourished, well developed, in no acute distress HEENT: normal Lymph: no adenopathy Neck: no JVD Endocrine:  No thryomegaly Vascular: No carotid bruits; FA pulses 2+ bilaterally without bruits  Cardiac:  normal S1, S2; RRR; no murmur  Lungs:  clear to auscultation bilaterally, no wheezing, rhonchi or rales  Abd: soft, nontender, no hepatomegaly  Ext: no edema Musculoskeletal:  No deformities, BUE and BLE strength normal and equal Skin: warm and dry  Neuro:  CNs 2-12 intact, no focal abnormalities noted Psych:  Normal affect   EKG:  The EKG was personally reviewed and demonstrates:  Not performed this admit Telemetry:  Telemetry was personally reviewed and demonstrates: paced rhythm, 60s   Relevant CV Studies: Echo3/18 -EF 15, grade 2 DD, mild MR, severe LAE  Myoview 08/2011 Large area of scar in the anterior, anteroseptal, inferoseptal, inferior, anterolateral and apical distributions.  No signif ischemia. LV Ejection Fraction: 14%. LV Wall Motion: Severe diffuse hypokinesis, inferior, apical akinesis.  Cardiac catheterization 10/2008 LM: Okay LAD: Stent patent with 30% ISR, mid and distal 30% LCx: Tiny marginal branch 80% proximal, proximal to this stent patent with 30% ISR, mid CFX 40% RCA: 70% after RV branch then occluded (bridging collaterals) EF 10-15%   Laboratory Data:  Chemistry Recent Labs  Lab 04/03/18 1336 04/04/18 0444  NA 137 140  K 3.7 3.1*  CL 98 102  CO2 28 30  GLUCOSE 101* 94  BUN 25* 21  CREATININE 2.27* 1.84*  CALCIUM 8.8* 8.7*  GFRNONAA 30*  65*  GFRAA 35* 45*  ANIONGAP 11 8      Recent Labs  Lab 04/03/18 1336 04/04/18 0444  PROT 7.2 6.1*  ALBUMIN 3.8 3.3*  AST 41 31  ALT 26 23  ALKPHOS 75 66  BILITOT 1.0 0.9   Hematology Recent Labs  Lab 04/03/18 1336 04/04/18 0444  WBC 8.0 5.9  RBC 4.05* 3.99*  HGB 13.8 13.3  HCT 40.0 38.6*  MCV 98.8 96.7  MCH 34.1* 33.3  MCHC 34.5 34.5  RDW 15.8* 15.8*  PLT 195 194   Cardiac EnzymesNo results for input(s): TROPONINI in the last 168 hours. No results for input(s): TROPIPOC in the last 168 hours.  BNPNo results for input(s): BNP, PROBNP in the last 168 hours.  DDimer No results for input(s): DDIMER in the last 168 hours.  Radiology/Studies:  Dg Abd Acute W/chest  Result Date: 04/03/2018 CLINICAL DATA:  Hypotension, nausea, vomiting EXAM: DG ABDOMEN ACUTE W/ 1V CHEST COMPARISON:  01/16/2017 FINDINGS: Left AICD remains in place, unchanged. Mild cardiomegaly. Lungs clear. No effusions or edema. Mildly prominent right abdominal small bowel loops with scattered air-fluid levels, nonspecific. This could reflect focal ileus or early partial small bowel obstruction. No organomegaly or free air. No suspicious calcification. No acute bony abnormality. IMPRESSION: Nonspecific bowel gas pattern with mildly prominent right abdominal small bowel loops. This could reflect focal ileus or early low grade small bowel obstruction. Cardiomegaly. Electronically Signed   By: Rolm Baptise M.D.   On: 04/03/2018 19:39    Assessment and Plan:   Mr. Liberto is a 62 y/o male with a h/o CAD, ischemic cardiomyopathy, chronic systolic HF w/ EF of 20%, s/p BiV-ICD, V. 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 Cardiology consulted for management of chronic systolic heart failure, at the request of Dr. Florene Glen, Internal Medicine.    1. Hypotension:  secondary to dehydration. BP improved w/ IVFs from the 35D systolic to upper 97C. Continue to monitor. See med recommendations below.   2. AKI: 2/2 dehydration. Admit SCr ws 2.2 (baseline 1.1). Scr improving with IVF hydration, down to 1.8 today. Continue to hold Lasix and Entresto. Continue to monitor.   3. Dehydration: multifactorial. Recent h/o postprandial vomiting and diarrhea + decreased PO intake + 2 diuretic medications, Lasix high dose 80 mg BID + Entresto (also has diuretic effect). Both Lasix and Entresto currently on hold. Continue gentle IVF hydration w/ close monitoring of volume status given systolic HF. Management of vomiting and diarrhea per primary team.   4. Chronic Systolic HF/ Ischemic Cardiomyopathy: EF 15%. All HF medications are currently on hold due to hypotension and AKI. Once BP and renal function improves, would recommend restarting meds if able to tolerate. Given the diuretic effects of Entresto, we can try discontinuing daily Lasix and change to PRN only. Can also change potassium to PRN only.  If BP continues to be a limiting factor, we can also consider reducing dose of Coreg. He was on previously on 12.5 mg BID. Monitor volume status closely given IVFs. We will continue to follow along with you.   4. PAF: on amiodarone. On Eliquis for a/c.  5. V. Tach: s/p ablation. On amiodarone. Also has ICD. Followed by Dr. Caryl Comes.   6. CAD: stable. No chest pain.   For questions or updates, please contact [place] Please consult [email or web address] for contact  info under     Signed, Lyda Jester, PA-C  04/04/2018 8:40 AM   Pt seen and examined   I agre with findings as noted by B Simmons above Pt is a 62 yo with hx of ICM, VT, PAF,  Seen in clinic by B Crenshaw yesterday  Complained of N/ GI problems   Intake down   Sent to [facility]  Pt currently is getting slow hydration  On exam:  Pt orthostatic with BP decreasing to 77 / with statnding    Neck:  JVP is  normal Cardiac RRR   No S3 Lungs are CTA  Abd suple  Ext sithout edema   I am not sure etiology for N/   I do not think it is due to amio   Should have happened a while ago I would geep gentle hydration    I would stop Dig.   Risk for toxicity outweights benefit.  Will reassess in AM.  Dorris Carnes

## 2018-04-04 NOTE — Progress Notes (Signed)
Initial Nutrition Assessment  DOCUMENTATION CODES:   Not applicable  INTERVENTION:  - Continue Ensure Enlive BID, each supplement provides 350 kcal and 20 grams of protein. - Continue to encourage PO intakes.    NUTRITION DIAGNOSIS:   Increased nutrient needs related to chronic illness, catabolic illness, cancer and cancer related treatments as evidenced by estimated needs.  GOAL:   Patient will meet greater than or equal to 90% of their needs  MONITOR:   PO intake, Supplement acceptance, Weight trends, Labs  REASON FOR ASSESSMENT:   Malnutrition Screening Tool  ASSESSMENT:   62 y.o. male with a past medical history of CHF with a EF of 15%, atrial fibrillation on amiodarone and anticoagulation, diagnosed with a small cell lung cancer in 06/2017 and completed chemo in 11/2017 and radiation in 01/2018. Patient presented to his cardiologist office and was found to be hypotensive. He mentioned that he has been feeling dizzy and lightheaded for the past few days. He reports vomiting 30-40 minutes after eating. He denies difficulty swallowing and denies any history of diabetic gastroparesis.  BMI indicates overweight status. No intakes documented this admission. Patient was eating lunch at the time of RD visit and had completed ~50% of meal and planned to continue eating after RD left. He reports breakfast was yogurt, a banana, and ~50% of a cup of grapes. After breakfast he felt slightly nauseated but not to the point of feeling like he was going to vomit.   Patient confirms that he has experienced vomiting after eating which has been ongoing for the past 3-4 weeks, after finishing radiation. He did not experience vomiting after eating while undergoing chemo or radiation. He feels very nauseated before each episode which occurs 15-45 minutes after eating. He states nothing makes the sensation better and he is unsure if anything makes it worse, unsure of any foods that are worse. It mainly  only happens with solid foods but has happened 1-2 times after drinking tea.   During chemo he was unable to tolerate the taste of vinegar, but had no other taste alterations/food aversions. He reports that while undergoing radiation he took a pill to numb his throat prior to eating as throat pain was very severe. He reports that since completing radiation this has completely resolved. He denies any pain or difficulties with chewing or swallowing, denies abdominal pain with or without eating.   Ensure ordered BID per ONS protocol and patient would like to continue this supplement. Per chart review, patient currently weighs 147 lb. Patient reports UBW of 175 lb but is unable to recall the last time he weighed this other than that it was before starting chemo. He is unsure of when he started chemo.   Per chart review, weight on 10/23 was 156 lb (9 lb weight loss/5.8% body weight in 1.5 months; not significant for time frame) and on 7/8 he weighed 159 lb (12 lb weight loss/7.5% body weight in 5 months; not significant for time frame).   Medications reviewed; sliding scale Novolog, 40 mEq K-Dur x2 doses today. Labs reviewed; CBG: 94 mg/dL, K: 3.1 mmol/L, creatinine: 1.84 mg/dL, Ca: 8.7 mg/dL, GFR: 38 mL/min.  IVF; NS @ 50 mL/hr.     NUTRITION - FOCUSED PHYSICAL EXAM:    Most Recent Value  Orbital Region  No depletion  Upper Arm Region  Mild depletion  Thoracic and Lumbar Region  No depletion  Buccal Region  No depletion  Temple Region  No depletion  Clavicle Bone Region  Mild depletion  Clavicle and Acromion Bone Region  No depletion  Scapular Bone Region  No depletion  Dorsal Hand  No depletion  Patellar Region  No depletion  Anterior Thigh Region  No depletion  Posterior Calf Region  No depletion  Edema (RD Assessment)  None  Hair  Reviewed  Eyes  Reviewed  Mouth  Reviewed  Skin  Reviewed  Nails  Reviewed       Diet Order:   Diet Order            Diet Carb Modified Fluid  consistency: Thin; Room service appropriate? Yes  Diet effective now              EDUCATION NEEDS:   No education needs have been identified at this time  Skin:  Skin Assessment: Reviewed RN Assessment  Last BM:  PTA/unknown  Height:   Ht Readings from Last 1 Encounters:  04/03/18 5\' 2"  (1.575 m)    Weight:   Wt Readings from Last 1 Encounters:  04/03/18 66.6 kg    Ideal Body Weight:  53.64 kg  BMI:  Body mass index is 26.85 kg/m.  Estimated Nutritional Needs:   Kcal:  4081-4481 kcal  Protein:  85-100 grams  Fluid:  >/= 1.8 L/day     Jarome Matin, MS, RD, LDN, [facility] Inpatient Clinical Dietitian Pager # [phone] After hours/weekend pager # [phone]

## 2018-04-04 NOTE — Progress Notes (Signed)
PROGRESS NOTE    Michael Krueger  EGB:[id number] DOB: December 12, 1955 DOA: 04/03/2018 PCP: System, Pcp Not In   Brief Narrative:  Michael Krueger is Michael Krueger 62 y.o. male with Michael Krueger past medical history of chronic systolic CHF with Michael Krueger EF of 15% based on echocardiogram in 2018, followed by [name] cardiology, history of atrial fibrillation on amiodarone and anticoagulation, diagnosed with Michael Krueger small cell lung cancer in March of this year and has completed chemotherapy and radiation in August and October respectively.  Patient presented to his cardiologist office today and was found to be hypotensive with Michael Krueger blood pressure in the 70s.  He mentioned that he has been feeling dizzy and lightheaded for the past few days.  Apparently has had Michael Krueger poor oral intake the last month or so which he attributed to his chemotherapy and radiation treatment.  He has had episodes of vomiting whenever he tries to eat foods.  He denies any difficulty swallowing.  He vomits about 30 to 40 minutes after he has eaten something.  He denies any history of diabetic gastroparesis.  Denies any abdominal pain.  Has had some loose stools recently as well.  Denies any chest pain shortness of breath.  No fever no chills.  No sick contacts.  No weakness in any one side of his body.  Assessment & Plan:   Principal Problem:   ARF (acute renal failure) (HCC) Active Problems:   ATRIAL FIBRILLATION   SYSTOLIC HEART FAILURE, CHRONIC   Automatic implantable cardioverter-defibrillator in situ   Hypotension   Nausea and vomiting   SCLC (small cell lung carcinoma) (HCC)   1 Hypotension likely secondary to hypovolemia:  - continue IVF, gentle with hx HF - stop digoxin  - he continues to have low blood pressures, especially when standing (orthostatics negative, but SBP down to 76 when standing) - Holding carvedilol, lasix, entresto, and digoxin  - appreciate cardiology assistance   #2  Acute renal failure:  Most likely due to hypovolemia with  hypotension as noted above.  Holding lasix, entresto. - continue IVF and follow - improving  #3 Nausea vomiting and occasional episodes of diarrhea: Acute abdominal series here initially with mildly prominent R abdominal small bowel loops concerning for ileus vs low grade SBO.  Repeat KUB today with resolution of the mildly distended small bowel loops.  He notes these sx present since chemo/radiation started. - only able to tolerate minimal diet this morning with yogurt/juice - continue IVF and encourage PO - dietician c/s  #4  Chronic systolic CHF: EF noted to be 15% status post AICD.   - holding carvedilol, lasix, entresto, digoxin  - appreciate cardiology recommendations  #5  History of atrial fibrillation: Continue amiodarone.  Continue apixaban.  #6 history of coronary artery disease and ischemic cardiomyopathy: Stable.  Denies any chest pain.  #7  History of ventricular tachycardia: Continue amiodarone  #8  History of diabetes mellitus type 2: Hold his metformin.  Put him on sliding scale coverage.  #9 Small cell lung cancer: Followed by oncology at [name].  Underwent chemoradiation.  Chemotherapy was completed in August and radiation treatments were completed end of October.  # Hypokalemia: replete, follow mag.  DVT prophylaxis: eliquis Code Status: full  Family Communication: none at bedside Disposition Plan: pending further improvement in BP, cardiology sign off   Consultants:   cardiology  Procedures:   none  Antimicrobials:  Anti-infectives (From admission, onward)   None      Subjective: Notes he was able  to eat yogurt and juice this AM. No vomiting.  +BM, passing gas. Had Michael Krueger on presentation.  Objective: Vitals:   04/04/18 0040 04/04/18 0529 04/04/18 0929 04/04/18 1236  BP: 96/67 97/66 (!) 91/57 90/60  Pulse:  61 60 (!) 59  Resp:  (!) 8  16  Temp:  98.4 F (36.9 C)  99.3 F (37.4 C)  TempSrc:  Oral  Oral  SpO2:  93% 95% 97%  Weight:        Height:        Intake/Output Summary (Last 24 hours) at 04/04/2018 1717 Last data filed at 04/04/2018 1500 Gross per 24 hour  Intake 967.58 ml  Output 400 ml  Net 567.58 ml   Filed Weights   04/03/18 1954  Weight: 66.6 kg    Examination:  General exam: Appears calm and comfortable  Respiratory system: Clear to auscultation. Respiratory effort normal. Cardiovascular system: S1 & S2 heard, RRR Gastrointestinal system: Abdomen is nondistended, soft and nontender. Central nervous system: Alert and oriented. No focal neurological deficits. Extremities: no lee Skin: No rashes, lesions or ulcers Psychiatry: Judgement and insight appear normal. Mood & affect appropriate.     Data Reviewed: I have personally reviewed following labs and imaging studies  CBC: Recent Labs  Lab 04/03/18 1336 04/04/18 0444  WBC 8.0 5.9  NEUTROABS 6.2  --   HGB 13.8 13.3  HCT 40.0 38.6*  MCV 98.8 96.7  PLT 195 527   Basic Metabolic Panel: Recent Labs  Lab 04/03/18 1336 04/04/18 0444  NA 137 140  K 3.7 3.1*  CL 98 102  CO2 28 30  GLUCOSE 101* 94  BUN 25* 21  CREATININE 2.27* 1.84*  CALCIUM 8.8* 8.7*   GFR: Estimated Creatinine Clearance: 35 mL/min (Michael Krueger) (by C-G formula based on SCr of 1.84 mg/dL (H)). Liver Function Tests: Recent Labs  Lab 04/03/18 1336 04/04/18 0444  AST 41 31  ALT 26 23  ALKPHOS 75 66  BILITOT 1.0 0.9  PROT 7.2 6.1*  ALBUMIN 3.8 3.3*   No results for input(s): LIPASE, AMYLASE in the last 168 hours. No results for input(s): AMMONIA in the last 168 hours. Coagulation Profile: No results for input(s): INR, PROTIME in the last 168 hours. Cardiac Enzymes: No results for input(s): CKTOTAL, CKMB, CKMBINDEX, TROPONINI in the last 168 hours. BNP (last 3 results) No results for input(s): PROBNP in the last 8760 hours. HbA1C: No results for input(s): HGBA1C in the last 72 hours. CBG: Recent Labs  Lab 04/03/18 2007 04/03/18 2313 04/04/18 0737 04/04/18 1116  04/04/18 1644  GLUCAP 110* 103* 94 165* 151*   Lipid Profile: No results for input(s): CHOL, HDL, LDLCALC, TRIG, CHOLHDL, LDLDIRECT in the last 72 hours. Thyroid Function Tests: No results for input(s): TSH, T4TOTAL, FREET4, T3FREE, THYROIDAB in the last 72 hours. Anemia Panel: No results for input(s): VITAMINB12, FOLATE, FERRITIN, TIBC, IRON, RETICCTPCT in the last 72 hours. Sepsis Labs: No results for input(s): PROCALCITON, LATICACIDVEN in the last 168 hours.  No results found for this or any previous visit (from the past 240 hour(s)).       Radiology Studies: Dg Abd 1 View  Result Date: 04/04/2018 CLINICAL DATA:  Two-day history of UPPER abdominal pain, nausea and vomiting. Follow-up ileus versus early low-grade partial small bowel obstruction. Surgical history includes splenectomy. EXAM: ABDOMEN - 1 VIEW COMPARISON:  Acute abdomen series yesterday and KUB 11/09/2008. FINDINGS: Interval resolution of the mildly distended loops of small bowel in the RIGHT mid abdomen since  yesterday's examination. Bowel gas pattern now normal. No suggestion of free air on the supine image. 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 Mild degenerative changes involving the LOWER lumbar spine as noted previously. IMPRESSION: No acute abdominal abnormality. Resolution of the mildly distended small bowel loops in the RIGHT mid abdomen since yesterday's examination. Electronically Signed   By: Evangeline Dakin M.D.   On: 04/04/2018 08:39   Dg Abd Acute W/chest  Result Date: 04/03/2018 CLINICAL DATA:  Hypotension, nausea, vomiting EXAM: DG ABDOMEN ACUTE W/ 1V CHEST COMPARISON:  01/16/2017 FINDINGS: Left AICD remains in place, unchanged. Mild cardiomegaly. Lungs clear. No effusions or edema. Mildly prominent right abdominal small bowel loops with scattered air-fluid levels, nonspecific. This could reflect focal ileus or early partial small bowel obstruction. No organomegaly or free air. No  suspicious calcification. No acute bony abnormality. IMPRESSION: Nonspecific bowel gas pattern with mildly prominent right abdominal small bowel loops. This could reflect focal ileus or early low grade small bowel obstruction. Cardiomegaly. Electronically Signed   By: Rolm Baptise M.D.   On: 04/03/2018 19:39        Scheduled Meds: . amiodarone  200 mg Oral Daily  . apixaban  5 mg Oral BID  . atorvastatin  80 mg Oral Daily  . cycloSPORINE  1 drop Both Eyes BID  . feeding supplement (ENSURE ENLIVE)  237 mL Oral BID BM  . insulin aspart  0-15 Units Subcutaneous TID WC  . pantoprazole  40 mg Oral Daily  . PARoxetine  20 mg Oral Daily   Continuous Infusions: . sodium chloride 50 mL/hr at 04/04/18 1500     LOS: 0 days    Time spent: over 30 min    Fayrene Helper, MD [facility] Pager [phone]  If 7PM-7AM, please contact night-coverage [email or web address] Password [email or web address] 04/04/2018, 5:17 PM

## 2018-04-05 ENCOUNTER — Other Ambulatory Visit: Payer: Self-pay | Admitting: Cardiology

## 2018-04-05 DIAGNOSIS — I9589 Other hypotension: Secondary | ICD-10-CM | POA: Diagnosis not present

## 2018-04-05 DIAGNOSIS — N179 Acute kidney failure, unspecified: Secondary | ICD-10-CM

## 2018-04-05 DIAGNOSIS — I5022 Chronic systolic (congestive) heart failure: Secondary | ICD-10-CM | POA: Diagnosis not present

## 2018-04-05 DIAGNOSIS — Z95810 Presence of automatic (implantable) cardiac defibrillator: Secondary | ICD-10-CM

## 2018-04-05 DIAGNOSIS — R112 Nausea with vomiting, unspecified: Secondary | ICD-10-CM

## 2018-04-05 DIAGNOSIS — C3490 Malignant neoplasm of unspecified part of unspecified bronchus or lung: Secondary | ICD-10-CM

## 2018-04-05 DIAGNOSIS — I480 Paroxysmal atrial fibrillation: Secondary | ICD-10-CM | POA: Diagnosis not present

## 2018-04-05 LAB — CBC
HCT: 37.7 % — ABNORMAL LOW (ref 39.0–52.0)
HEMOGLOBIN: 12.6 g/dL — AB (ref 13.0–17.0)
MCH: 32.6 pg (ref 26.0–34.0)
MCHC: 33.4 g/dL (ref 30.0–36.0)
MCV: 97.4 fL (ref 80.0–100.0)
Platelets: 182 10*3/uL (ref 150–400)
RBC: 3.87 MIL/uL — ABNORMAL LOW (ref 4.22–5.81)
RDW: 16.1 % — ABNORMAL HIGH (ref 11.5–15.5)
WBC: 8.6 10*3/uL (ref 4.0–10.5)
nRBC: 0 % (ref 0.0–0.2)

## 2018-04-05 LAB — COMPREHENSIVE METABOLIC PANEL
ALK PHOS: 66 U/L (ref 38–126)
ALT: 23 U/L (ref 0–44)
AST: 28 U/L (ref 15–41)
Albumin: 3.2 g/dL — ABNORMAL LOW (ref 3.5–5.0)
Anion gap: 6 (ref 5–15)
BUN: 21 mg/dL (ref 8–23)
CALCIUM: 8.9 mg/dL (ref 8.9–10.3)
CO2: 30 mmol/L (ref 22–32)
Chloride: 104 mmol/L (ref 98–111)
Creatinine, Ser: 1.7 mg/dL — ABNORMAL HIGH (ref 0.61–1.24)
GFR calc Af Amer: 49 mL/min — ABNORMAL LOW (ref >60)
GFR calc non Af Amer: 42 mL/min — ABNORMAL LOW (ref >60)
Glucose, Bld: 115 mg/dL — ABNORMAL HIGH (ref 70–99)
Potassium: 4.2 mmol/L (ref 3.5–5.1)
Sodium: 140 mmol/L (ref 135–145)
Total Bilirubin: 0.8 mg/dL (ref 0.3–1.2)
Total Protein: 6 g/dL — ABNORMAL LOW (ref 6.5–8.1)

## 2018-04-05 LAB — GLUCOSE, CAPILLARY
Glucose-Capillary: 120 mg/dL — ABNORMAL HIGH (ref 70–99)
Glucose-Capillary: 160 mg/dL — ABNORMAL HIGH (ref 70–99)

## 2018-04-05 LAB — MAGNESIUM: Magnesium: 2 mg/dL (ref 1.7–2.4)

## 2018-04-05 NOTE — Progress Notes (Signed)
Order for outpatient BMP placed for 04/06/18. Clinic f/u w/ cards APP scheduled for 04/09/18. Appt time in AVS.

## 2018-04-05 NOTE — Progress Notes (Addendum)
Progress Note  Patient Name: Michael Krueger Date of Encounter: 04/05/2018  Primary Cardiologist: Kirk Ruths, MD   Subjective   He reports feeling a bit better today. Less dizzy when he stands but orthostatic VS still +. SBP in the upper 60s upon standing per RN report. She plans to recheck. He denies dyspnea and CP.   Inpatient Medications    Scheduled Meds: . amiodarone  200 mg Oral Daily  . apixaban  5 mg Oral BID  . atorvastatin  80 mg Oral Daily  . cycloSPORINE  1 drop Both Eyes BID  . feeding supplement (ENSURE ENLIVE)  237 mL Oral BID BM  . insulin aspart  0-15 Units Subcutaneous TID WC  . pantoprazole  40 mg Oral Daily  . PARoxetine  20 mg Oral Daily   Continuous Infusions: . sodium chloride 50 mL/hr at 04/04/18 1500   PRN Meds: acetaminophen **OR** acetaminophen, traZODone   Vital Signs    Vitals:   04/04/18 2107 04/05/18 0535 04/05/18 0537 04/05/18 0538  BP: 94/75 105/68 96/64 (!) 69/53  Pulse: (!) 59 64 67 64  Resp: 18 18    Temp: 99.7 F (37.6 C) 98.8 F (37.1 C)    TempSrc: Oral Oral    SpO2: 95% 94%    Weight:      Height:        Intake/Output Summary (Last 24 hours) at 04/05/2018 0858 Last data filed at 04/04/2018 1500 Gross per 24 hour  Intake 126.55 ml  Output -  Net 126.55 ml   Filed Weights   04/03/18 1954  Weight: 66.6 kg    Telemetry    Paced rhyhthm - Personally Reviewed  ECG    Not performed today- Personally Reviewed  Physical Exam   GEN: No acute distress.   Neck: No JVD Cardiac: RRR, no murmurs, rubs, or gallops.  Respiratory: Clear to auscultation bilaterally. GI: Soft, nontender, non-distended  MS: No edema; No deformity. Neuro:  Nonfocal  Psych: Normal affect   Labs    Chemistry Recent Labs  Lab 04/03/18 1336 04/04/18 0444 04/05/18 0514  NA 137 140 140  K 3.7 3.1* 4.2  CL 98 102 104  CO2 28 30 30   GLUCOSE 101* 94 115*  BUN 25* 21 21  CREATININE 2.27* 1.84* 1.70*  CALCIUM 8.8* 8.7* 8.9    PROT 7.2 6.1* 6.0*  ALBUMIN 3.8 3.3* 3.2*  AST 41 31 28  ALT 26 23 23   ALKPHOS 75 66 66  BILITOT 1.0 0.9 0.8  GFRNONAA 30* 38* 42*  GFRAA 35* 45* 49*  ANIONGAP 11 8 6      Hematology Recent Labs  Lab 04/03/18 1336 04/04/18 0444 04/05/18 0514  WBC 8.0 5.9 8.6  RBC 4.05* 3.99* 3.87*  HGB 13.8 13.3 12.6*  HCT 40.0 38.6* 37.7*  MCV 98.8 96.7 97.4  MCH 34.1* 33.3 32.6  MCHC 34.5 34.5 33.4  RDW 15.8* 15.8* 16.1*  PLT 195 194 182    Cardiac EnzymesNo results for input(s): TROPONINI in the last 168 hours. No results for input(s): TROPIPOC in the last 168 hours.   BNPNo results for input(s): BNP, PROBNP in the last 168 hours.   DDimer No results for input(s): DDIMER in the last 168 hours.   Radiology    Dg Abd 1 View  Result Date: 04/04/2018 CLINICAL DATA:  Two-day history of UPPER abdominal pain, nausea and vomiting. Follow-up ileus versus early low-grade partial small bowel obstruction. Surgical history includes splenectomy. EXAM: ABDOMEN - 1  VIEW COMPARISON:  Acute abdomen series yesterday and KUB 11/09/2008. FINDINGS: Interval resolution of the mildly distended loops of small bowel in the RIGHT mid abdomen since yesterday's examination. Bowel gas pattern now normal. No suggestion of free air on the supine image. 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 Mild degenerative changes involving the LOWER lumbar spine as noted previously. IMPRESSION: No acute abdominal abnormality. Resolution of the mildly distended small bowel loops in the RIGHT mid abdomen since yesterday's examination. Electronically Signed   By: Evangeline Dakin M.D.   On: 04/04/2018 08:39   Dg Abd Acute W/chest  Result Date: 04/03/2018 CLINICAL DATA:  Hypotension, nausea, vomiting EXAM: DG ABDOMEN ACUTE W/ 1V CHEST COMPARISON:  01/16/2017 FINDINGS: Left AICD remains in place, unchanged. Mild cardiomegaly. Lungs clear. No effusions or edema. Mildly prominent right abdominal small bowel loops  with scattered air-fluid levels, nonspecific. This could reflect focal ileus or early partial small bowel obstruction. No organomegaly or free air. No suspicious calcification. No acute bony abnormality. IMPRESSION: Nonspecific bowel gas pattern with mildly prominent right abdominal small bowel loops. This could reflect focal ileus or early low grade small bowel obstruction. Cardiomegaly. Electronically Signed   By: Rolm Baptise M.D.   On: 04/03/2018 19:39    Cardiac Studies   Echo3/18 -EF 15, grade 2 DD, mild MR, severe LAE  Myoview 08/2011 Large area of scar in the anterior, anteroseptal, inferoseptal, inferior, anterolateral and apical distributions.  No signif ischemia. LV Ejection Fraction: 14%. LV Wall Motion: Severe diffuse hypokinesis, inferior, apical akinesis.  Cardiac catheterization 10/2008 LM: Okay LAD: Stent patent with 30% ISR, mid and distal 30% LCx: Tiny marginal branch 80% proximal, proximal to this stent patent with 30% ISR, mid CFX 40% RCA: 70% after RV branch then occluded (bridging collaterals) EF 10-15%  Patient Profile     Mr. Covault is a 62 y/o male with a h/o CAD, ischemic cardiomyopathy, chronic systolic HF w/ EF of 84%, s/p BiV-ICD, V. 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 Cardiology consulted for management of chronic systolic heart failure, at the request of Dr. Florene Glen, Internal Medicine.    Assessment & Plan    1. Hypotension: in the setting of dehydration (poor PO intake/ vomiting and diuretics). Getting gentle IV fluid hydration. Diuretics and HF/ antihypertensives on hold.   2. AKI: 2/2 dehydration but also ? Cardiorenal component given severe systolic HF. Admit SCr ws 2.2 (baseline 1.1). Scr improving with IVF hydration, down from 2.27>>1.84>>1.70  today. Continue to hold Lasix and Entresto. Continue to monitor. Digoxin was discontinued yesterday.     3. Dehydration: multifactorial. Recent h/o postprandial vomiting and diarrhea + decreased PO intake + 2 diuretic medications, Lasix high dose 80 mg BID + Entresto (also has diuretic effect). Both Lasix and Entresto currently on hold. Continue gentle IVF hydration w/ close monitoring of volume status given systolic HF. Management of vomiting and diarrhea per primary team.   4. Chronic Systolic HF/ Ischemic Cardiomyopathy: EF 15%. All HF medications are currently on hold due to hypotension and AKI. He remains hypotensive despite measures. We may need to consider transfer to [facility] and ask the AHF team to assess. ? Inotropes.   Once BP and renal function improves, would recommend restarting meds if able to tolerate. Given the diuretic effects of Entresto, we can try discontinuing daily Lasix  and change to PRN only. Can also change potassium to PRN only.  If BP continues to be a limiting factor, we can also consider reducing dose of Coreg. He was on previously on 12.5 mg BID. Monitor volume status closely given IVFs. We will continue to follow along with you.   4. PAF: on amiodarone. On Eliquis for a/c. Digoxin was discontinued yesterday due to AKI.   5. V. Tach: s/p ablation. On amiodarone. Also has ICD. Followed by Dr. Caryl Comes.   6. CAD: stable. No chest pain.   For questions or updates, please contact [place] Please consult [email or web address] for contact info under        Signed, Lyda Jester, PA-C  04/05/2018, 8:58 AM    Pt seen and examined   I agree with findings as noted by B Simmons above Pt sitting in bed today   Hungry  No nausea (tray is not low salt)  Lungs are CTA Cardiac RRR   No signif murmu  Ext without edema  Cr still 1.7 but better than admit  Would d/c IV fluids   Let pt autohydrate Ambulate   If no dizziness OK to D/C    WIll get labs tomorrow at office Decide  then about when to restart I recomm that pt invest in a bp cuff to take BP at home   Keep log   Esp important when feeling bad   Dorris Carnes

## 2018-04-05 NOTE — Progress Notes (Signed)
Went over discharge papers with patient.  All questions answered. AVS given.  Pt wheeled out via Therapist, sports.

## 2018-04-05 NOTE — Discharge Summary (Signed)
Discharge Summary  Michael Krueger TKP:[id number] DOB: 04-04-1956  PCP: System, Pcp Not In  Admit date: 04/03/2018 Discharge date: 04/05/2018  Time spent: 30 mins   Recommendations for Outpatient Follow-up:  1. Appointment set up with cardiology for close follow up on 12/6 and 12/9  Discharge Diagnoses:  Active Hospital Problems   Diagnosis Date Noted  . ARF (acute renal failure) ([place]) 04/03/2018  . Hypotension 04/03/2018  . Nausea and vomiting 04/03/2018  . SCLC (small cell lung carcinoma) ([place]) 04/03/2018  . Automatic implantable cardioverter-defibrillator in situ   . SYSTOLIC HEART FAILURE, CHRONIC 11/26/2008  . ATRIAL FIBRILLATION 07/18/2008    Resolved Hospital Problems  No resolved problems to display.    Discharge Condition: Stable  Diet recommendation: Heart healthy  Vitals:   04/05/18 1004 04/05/18 1331  BP: 97/67 106/73  Pulse:  61  Resp:  12  Temp:  98.5 F (36.9 C)  SpO2: 97% 97%    History of present illness:  Michael Bulman Matherlyis a 62 y.o.malewith a past medical history of chronic systolic CHF with a EF of 15% based on echocardiogram in 2018, followed [facility] cardiology, history of atrial fibrillation on amiodarone and anticoagulation, diagnosed with a small cell lung cancer in March of this year and has completed chemotherapy and radiation in August and October respectively. Patient presented to his cardiologist office and was found to be hypotensive with a blood pressure in the 70s. He mentioned that he has been feeling dizzy and lightheaded for the past few days. Apparently has had a poor oral intake the last month or so which he attributed to his chemotherapy and radiation treatment. He has had episodes of vomiting whenever he tries to eat foods. He denies any difficulty swallowing. He vomits about 30 to 40 minutes after he has eaten something. He denies any history of diabetic gastroparesis. Denies any abdominal pain. Has had some loose  stools recently as well. Denies any chest pain shortness of breath. No fever no chills. No sick contacts. No weakness in any one side of his body.  Patient admitted for further management    Today, pt reported feeling better, denies any further dizziness on ambulation. Orthostatic hypotension improved per last reading. Able to keep down his food, without any vomiting. Pt stable to be d/c as per cardiology with very close follow up with them.  Hospital Course:  Principal Problem:   ARF (acute renal failure) (HCC) Active Problems:   ATRIAL FIBRILLATION   SYSTOLIC HEART FAILURE, CHRONIC   Automatic implantable cardioverter-defibrillator in situ   Hypotension   Nausea and vomiting   SCLC (small cell lung carcinoma) (HCC)  Hypotension likely secondary to hypovolemia Improved, orthostatic negative S/P IVF As per cardiology, hold carvedilol, lasix, entresto, and digoxin  Close follow up with cardiology already set up, with repeat labs  Acute renal failure Improved Most likely due to hypovolemia with hypotension as noted above Continue to hold lasix, entresto Follow up with cardiology with repeat labs tomorrow  Nausea/vomiting and occasional episodes of diarrhea Resolved Acute abdominal series initially with mildly prominent R abdominal small bowel loops concerning for ileus vs low grade SBO Repeat KUB with resolution of the mildly distended small bowel loops  Chronic systolic CHF EF noted to be 15% status post AICD Cardiology on board: holding carvedilol, lasix, entresto, digoxin Follow up with cardiology  History of atrial fibrillation Continue amiodarone, apixaban.  History of CAD/ischemic cardiomyopathy Stable. Denies any chest pain  History of ventricular tachycardia Continue amiodarone  History of diabetes mellitus type 2 Continue home metformin  Small cell lung cancer Followed by oncology at [facility]. Underwent chemoradiation. Chemotherapy was completed  in August and radiation treatments were completed end of October        Malnutrition Type:  Nutrition Problem: Increased nutrient needs Etiology: chronic illness, catabolic illness, cancer and cancer related treatments   Malnutrition Characteristics:  Signs/Symptoms: estimated needs   Nutrition Interventions:  Interventions: Ensure Enlive (each supplement provides 350kcal and 20 grams of protein)   Estimated body mass index is 26.85 kg/m as calculated from the following:   Height as of this encounter: 5\' 2"  (1.575 m).   Weight as of this encounter: 66.6 kg.    Procedures:  None  Consultations:  Cardiology  Discharge Exam: BP 106/73 (BP Location: Right Arm)   Pulse 61   Temp 98.5 F (36.9 C) (Oral)   Resp 12   Ht 5\' 2"  (1.575 m)   Wt 66.6 kg   SpO2 97%   BMI 26.85 kg/m   General: NAD Cardiovascular: S1, S2 present  Respiratory: CTAB  Discharge Instructions You were cared for by a hospitalist during your hospital stay. If you have any questions about your discharge medications or the care you received while you were in the hospital after you are discharged, you can call the unit and asked to speak with the hospitalist on call if the hospitalist that took care of you is not available. Once you are discharged, your primary care physician will handle any further medical issues. Please note that NO REFILLS for any discharge medications will be authorized once you are discharged, as it is imperative that you return to your primary care physician (or establish a relationship with a primary care physician if you do not have one) for your aftercare needs so that they can reassess your need for medications and monitor your lab values.   Allergies as of 04/05/2018      Reactions   Other Itching, Other (See Comments)   CHG wipes/  Caused a rash      Medication List    STOP taking these medications   carvedilol 12.5 MG tablet Commonly known as:  COREG   digoxin  0.125 MG tablet Commonly known as:  LANOXIN   ENTRESTO 24-26 MG Generic drug:  sacubitril-valsartan   furosemide 80 MG tablet Commonly known as:  LASIX   potassium chloride 20 MEQ packet Commonly known as:  KLOR-CON     TAKE these medications   acetaminophen 500 MG tablet Commonly known as:  TYLENOL Take 1,000 mg by mouth daily as needed for headache.   amiodarone 200 MG tablet Commonly known as:  PACERONE Take 1 tablet (200 mg total) by mouth daily for 77 doses.   atorvastatin 80 MG tablet Commonly known as:  LIPITOR TAKE 1 TABLET (80 MG TOTAL) BY MOUTH DAILY.   ELIQUIS 5 MG Tabs tablet Generic drug:  apixaban TAKE 1 TABLET BY MOUTH TWICE A DAY What changed:  how much to take   metFORMIN 850 MG tablet Commonly known as:  GLUCOPHAGE Take 850 mg by mouth daily.   ondansetron 8 MG tablet Commonly known as:  ZOFRAN Take 8 mg by mouth every 8 (eight) hours as needed for nausea or vomiting.   pantoprazole 40 MG tablet Commonly known as:  PROTONIX Take 1 tablet by mouth Daily.   PARoxetine 20 MG tablet Commonly known as:  PAXIL Take 20 mg by mouth daily.   RESTASIS 0.05 % ophthalmic  emulsion Generic drug:  cycloSPORINE Place 1 drop into both eyes 2 (two) times daily.   traZODone 50 MG tablet Commonly known as:  DESYREL Take 50 mg by mouth daily as needed for sleep.      Allergies  Allergen Reactions  . Other Itching and Other (See Comments)    CHG wipes/  Caused a rash   Follow-up Information    Erlene Quan, PA-C Follow up on 04/09/2018.   Specialties:  Cardiology, Radiology Why:  8:00 AM (Dr. Jacalyn Lefevre PA)  Contact information: [address] [place] [address] [phone]        [place] Follow up on 04/06/2018.   Specialty:  Cardiology Why:  need blood work done on 04/06/18 (BMP) Contact information: [address] [place] [phone]           The results of  significant diagnostics from this hospitalization (including imaging, microbiology, ancillary and laboratory) are listed below for reference.    Significant Diagnostic Studies: Dg Abd 1 View  Result Date: 04/04/2018 CLINICAL DATA:  Two-day history of UPPER abdominal pain, nausea and vomiting. Follow-up ileus versus early low-grade partial small bowel obstruction. Surgical history includes splenectomy. EXAM: ABDOMEN - 1 VIEW COMPARISON:  Acute abdomen series yesterday and KUB 11/09/2008. FINDINGS: Interval resolution of the mildly distended loops of small bowel in the RIGHT mid abdomen since yesterday's examination. Bowel gas pattern now normal. No suggestion of free air on the supine image. 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 Mild degenerative changes involving the LOWER lumbar spine as noted previously. IMPRESSION: No acute abdominal abnormality. Resolution of the mildly distended small bowel loops in the RIGHT mid abdomen since yesterday's examination. Electronically Signed   By: Evangeline Dakin M.D.   On: 04/04/2018 08:39   Dg Abd Acute W/chest  Result Date: 04/03/2018 CLINICAL DATA:  Hypotension, nausea, vomiting EXAM: DG ABDOMEN ACUTE W/ 1V CHEST COMPARISON:  01/16/2017 FINDINGS: Left AICD remains in place, unchanged. Mild cardiomegaly. Lungs clear. No effusions or edema. Mildly prominent right abdominal small bowel loops with scattered air-fluid levels, nonspecific. This could reflect focal ileus or early partial small bowel obstruction. No organomegaly or free air. No suspicious calcification. No acute bony abnormality. IMPRESSION: Nonspecific bowel gas pattern with mildly prominent right abdominal small bowel loops. This could reflect focal ileus or early low grade small bowel obstruction. Cardiomegaly. Electronically Signed   By: Rolm Baptise M.D.   On: 04/03/2018 19:39    Microbiology: No results found for this or any previous visit (from the past 240 hour(s)).     Labs: Basic Metabolic Panel: Recent Labs  Lab 04/03/18 1336 04/04/18 0444 04/05/18 0514  NA 137 140 140  K 3.7 3.1* 4.2  CL 98 102 104  CO2 28 30 30   GLUCOSE 101* 94 115*  BUN 25* 21 21  CREATININE 2.27* 1.84* 1.70*  CALCIUM 8.8* 8.7* 8.9  MG  --   --  2.0   Liver Function Tests: Recent Labs  Lab 04/03/18 1336 04/04/18 0444 04/05/18 0514  AST 41 31 28  ALT 26 23 23   ALKPHOS 75 66 66  BILITOT 1.0 0.9 0.8  PROT 7.2 6.1* 6.0*  ALBUMIN 3.8 3.3* 3.2*   No results for input(s): LIPASE, AMYLASE in the last 168 hours. No results for input(s): AMMONIA in the last 168 hours. CBC: Recent Labs  Lab 04/03/18 1336 04/04/18 0444 04/05/18 0514  WBC 8.0 5.9 8.6  NEUTROABS 6.2  --   --   HGB 13.8 13.3 12.6*  HCT 40.0 38.6* 37.7*  MCV 98.8 96.7 97.4  PLT 195 194 182   Cardiac Enzymes: No results for input(s): CKTOTAL, CKMB, CKMBINDEX, TROPONINI in the last 168 hours. BNP: BNP (last 3 results) Recent Labs    12/04/17 1100  BNP 433.3*    ProBNP (last 3 results) No results for input(s): PROBNP in the last 8760 hours.  CBG: Recent Labs  Lab 04/04/18 1116 04/04/18 1644 04/04/18 2105 04/05/18 0757 04/05/18 1159  GLUCAP 165* 151* 167* 120* 160*       Signed:  Alma Friendly, MD [facility] 04/05/2018, 1:59 PM

## 2018-04-06 ENCOUNTER — Other Ambulatory Visit: Payer: Self-pay | Admitting: *Deleted

## 2018-04-06 DIAGNOSIS — N179 Acute kidney failure, unspecified: Secondary | ICD-10-CM

## 2018-04-06 LAB — BASIC METABOLIC PANEL
BUN/Creatinine Ratio: 10 (ref 10–24)
BUN: 17 mg/dL (ref 8–27)
CO2: 25 mmol/L (ref 20–29)
Calcium: 9.6 mg/dL (ref 8.6–10.2)
Chloride: 101 mmol/L (ref 96–106)
Creatinine, Ser: 1.62 mg/dL — ABNORMAL HIGH (ref 0.76–1.27)
GFR calc Af Amer: 52 mL/min/{1.73_m2} — ABNORMAL LOW (ref >59)
GFR, EST NON AFRICAN AMERICAN: 45 mL/min/{1.73_m2} — AB (ref >59)
Glucose: 126 mg/dL — ABNORMAL HIGH (ref 65–99)
Potassium: 5.3 mmol/L — ABNORMAL HIGH (ref 3.5–5.2)
SODIUM: 140 mmol/L (ref 134–144)

## 2018-04-09 ENCOUNTER — Ambulatory Visit (INDEPENDENT_AMBULATORY_CARE_PROVIDER_SITE_OTHER): Payer: Medicare PPO | Admitting: Cardiology

## 2018-04-09 ENCOUNTER — Encounter: Payer: Self-pay | Admitting: Cardiology

## 2018-04-09 ENCOUNTER — Ambulatory Visit (INDEPENDENT_AMBULATORY_CARE_PROVIDER_SITE_OTHER): Payer: Medicare PPO

## 2018-04-09 VITALS — BP 110/56 | HR 75 | Ht 62.0 in | Wt 158.0 lb

## 2018-04-09 DIAGNOSIS — I2510 Atherosclerotic heart disease of native coronary artery without angina pectoris: Secondary | ICD-10-CM

## 2018-04-09 DIAGNOSIS — N179 Acute kidney failure, unspecified: Secondary | ICD-10-CM

## 2018-04-09 DIAGNOSIS — I472 Ventricular tachycardia: Secondary | ICD-10-CM | POA: Diagnosis not present

## 2018-04-09 DIAGNOSIS — I255 Ischemic cardiomyopathy: Secondary | ICD-10-CM

## 2018-04-09 DIAGNOSIS — I4729 Other ventricular tachycardia: Secondary | ICD-10-CM

## 2018-04-09 DIAGNOSIS — C3490 Malignant neoplasm of unspecified part of unspecified bronchus or lung: Secondary | ICD-10-CM

## 2018-04-09 DIAGNOSIS — I480 Paroxysmal atrial fibrillation: Secondary | ICD-10-CM

## 2018-04-09 DIAGNOSIS — Z95810 Presence of automatic (implantable) cardiac defibrillator: Secondary | ICD-10-CM | POA: Diagnosis not present

## 2018-04-09 LAB — CUP PACEART REMOTE DEVICE CHECK
Battery Remaining Longevity: 16 mo
Battery Voltage: 2.92 V
Brady Statistic AP VP Percent: 87.87 %
Brady Statistic AP VS Percent: 0.01 %
Brady Statistic AS VP Percent: 12.11 %
Brady Statistic AS VS Percent: 0 %
Brady Statistic RA Percent Paced: 87.67 %
Brady Statistic RV Percent Paced: 99.96 %
Date Time Interrogation Session: 20191209072707
HighPow Impedance: 35 Ohm
HighPow Impedance: 45 Ohm
Implantable Lead Implant Date: 20000626
Implantable Lead Implant Date: 20000816
Implantable Lead Implant Date: 20100818
Implantable Lead Implant Date: 20100818
Implantable Lead Location: 753859
Implantable Lead Location: 753860
Implantable Lead Location: 753860
Implantable Lead Model: 5076
Implantable Lead Model: 6940
Implantable Lead Model: 6945
Implantable Pulse Generator Implant Date: 20150624
Lead Channel Impedance Value: 304 Ohm
Lead Channel Impedance Value: 304 Ohm
Lead Channel Impedance Value: 399 Ohm
Lead Channel Impedance Value: 437 Ohm
Lead Channel Pacing Threshold Amplitude: 1.625 V
Lead Channel Pacing Threshold Pulse Width: 0.4 ms
Lead Channel Sensing Intrinsic Amplitude: 1.625 mV
Lead Channel Sensing Intrinsic Amplitude: 1.625 mV
Lead Channel Setting Pacing Amplitude: 2.5 V
Lead Channel Setting Pacing Amplitude: 2.5 V
Lead Channel Setting Pacing Amplitude: 3.25 V
Lead Channel Setting Pacing Pulse Width: 0.4 ms
Lead Channel Setting Pacing Pulse Width: 0.4 ms
Lead Channel Setting Sensing Sensitivity: 0.3 mV
MDC IDC LEAD LOCATION: 753858
MDC IDC MSMT LEADCHNL LV IMPEDANCE VALUE: 722 Ohm
MDC IDC MSMT LEADCHNL RV IMPEDANCE VALUE: 437 Ohm

## 2018-04-09 MED ORDER — SACUBITRIL-VALSARTAN 24-26 MG PO TABS: 1.0000 | ORAL_TABLET | Freq: Two times a day (BID) | ORAL | 3 refills | Status: DC

## 2018-04-09 NOTE — Progress Notes (Addendum)
Labs done 04/16/18- K+ 4.3, BUN/SCr- 12/1.4 GFR 52  Michael Battey PA-C 04/17/2018 2:32 PM    04/09/2018 JESE COMELLA   [birth year]  [id number]  Primary Physician System, Pcp Not In Primary Cardiologist: Dr Stanford Breed- Dr Caryl Comes  HPI:  Mr Keating is a 62 y.o. male with a history of ICM, CAD, chronic systolic heart failure, history of V. tach status post ablation, and AF on amiodarone and Eliquis.  Most recent echo March 2018 revealed an ejection fraction of 15% with grade 2 diastolic dysfunction, mild MR, and severe LAE. He has an ICD in place, MDT CRTD implanted 2010, gen change 2015. He was diagnosed with lung cancer this past Spring and was treated with chemo and radiation (no surgery). He finished his chemo in Aug and his radiation Rx at the end of Oct.    He ended up at [facility] 04/03/18 with nausea, hypotension, and AKI.  He was admitted and his Coreg, Lasix, K+, and Entresto were held.  His SCr drifted down, it was 1.6 on 12/06, baseline is 1.1.  He is in the office today for follow up.  He feels well though he has noticed some DOE, no edema, no orthopnea.  His B/P is better 110/56.    Current Outpatient Medications  Medication Sig Dispense Refill  . acetaminophen (TYLENOL) 500 MG tablet Take 1,000 mg by mouth daily as needed for headache.     [name] atorvastatin (LIPITOR) 80 MG tablet TAKE 1 TABLET (80 MG TOTAL) BY MOUTH DAILY. 90 tablet 3  . cycloSPORINE (RESTASIS) 0.05 % ophthalmic emulsion Place 1 drop into both eyes 2 (two) times daily.    [name] ELIQUIS 5 MG TABS tablet TAKE 1 TABLET BY MOUTH TWICE A DAY (Patient taking differently: Take 5 mg by mouth 2 (two) times daily. ) 180 tablet 1  . metFORMIN (GLUCOPHAGE) 850 MG tablet Take 850 mg by mouth daily.     . ondansetron (ZOFRAN) 8 MG tablet Take 8 mg by mouth every 8 (eight) hours as needed for nausea or vomiting.    . pantoprazole (PROTONIX) 40 MG tablet Take 1 tablet by mouth Daily.    [name] PARoxetine (PAXIL) 20 MG tablet Take 20 mg by  mouth daily.    . traZODone (DESYREL) 50 MG tablet Take 50 mg by mouth daily as needed for sleep.     [name] amiodarone (PACERONE) 200 MG tablet Take 1 tablet (200 mg total) by mouth daily for 77 doses. 90 tablet 3  . sacubitril-valsartan (ENTRESTO) 24-26 MG Take 1 tablet by mouth 2 (two) times daily. 180 tablet 3   No current facility-administered medications for this visit.     Allergies  Allergen Reactions  . Other Itching and Other (See Comments)    CHG wipes/  Caused a rash    Past Medical History:  Diagnosis Date  . Atrial fibrillation ([place])   . AV BLOCK, COMPLETE   . CAD   . Chronic systolic heart failure ([place])   . Gynecomastia   . HYPERLIPIDEMIA-MIXED   . HYPERTHYROIDISM   . Implantable cardiac defibrillator  medtronic    Initially related 1990s, upgrade to dual-chamber 2002, generator change 2005, generator change August 2010, hematoma evacuation September 2010.  . Ischemic cardiomyopathy   . Long term current use of anticoagulant   . OBESITY-MORBID (>100')   . TRANSAMINASES, SERUM, ELEVATED   . VENTRICULAR TACHYCARDIA    S/P RFCA x3  2006, 2010,2010    Social History   Socioeconomic History  .  Marital status: Single    Spouse name: Not on file  . Number of children: Not on file  . Years of education: Not on file  . Highest education level: Not on file  Occupational History  . Not on file  Social Needs  . Financial resource strain: Not on file  . Food insecurity:    Worry: Not on file    Inability: Not on file  . Transportation needs:    Medical: Not on file    Non-medical: Not on file  Tobacco Use  . Smoking status: Former Research scientist (life sciences)  . Smokeless tobacco: Never Used  Substance and Sexual Activity  . Alcohol use: Yes  . Drug use: No  . Sexual activity: Not on file  Lifestyle  . Physical activity:    Days per week: Not on file    Minutes per session: Not on file  . Stress: Not on file  Relationships  . Social connections:    Talks on phone: Not on file      Gets together: Not on file    Attends religious service: Not on file    Active member of club or organization: Not on file    Attends meetings of clubs or organizations: Not on file    Relationship status: Not on file  . Intimate partner violence:    Fear of current or ex partner: Not on file    Emotionally abused: Not on file    Physically abused: Not on file    Forced sexual activity: Not on file  Other Topics Concern  . Not on file  Social History Narrative  . Not on file     Family History  Problem Relation Age of Onset  . Cancer Mother   . Heart attack Maternal Grandfather   . Stroke Paternal Grandfather   . Hypertension Sister      Review of Systems: General: negative for chills, fever, night sweats or weight changes.  Cardiovascular: negative for chest pain, dyspnea on exertion, edema, orthopnea, palpitations, paroxysmal nocturnal dyspnea or shortness of breath Dermatological: negative for rash Respiratory: negative for cough or wheezing Urologic: negative for hematuria Abdominal: negative for nausea, vomiting, diarrhea, bright red blood per rectum, melena, or hematemesis Neurologic: negative for visual changes, syncope, or dizziness All other systems reviewed and are otherwise negative except as noted above.    Blood pressure (!) 110/56, pulse 75, height 5\' 2"  (1.575 m), weight 158 lb (71.7 kg).  General appearance: alert, cooperative and no distress Lungs: clear to auscultation bilaterally and few basilar carckles Heart: regular rate and rhythm Extremities: no edema Neurologic: Grossly normal  EKG AV paced  ASSESSMENT AND PLAN:   Automatic implantable cardioverter-defibrillator in situ MDT-gen change 2015  CAD (coronary artery disease), native coronary artery Prior PCIs- last cath 2010- medical Rx  ATRIAL FIBRILLATION CAF, paced  SCLC (small cell lung carcinoma) (HCC) Treated with chemo and radiation- [facility]  AKI (acute kidney injury) ([place]) Pt  admitted 04/03/18 with acute renal injury secondary to medications and low output.  Renal function improving, B/P better. Will resume low dose Entresto, check BMP in one week, f/u in 2-3 weeks.    PLAN  Resume Entresto 24/26. Continue to hold Lasix and K+, and Coreg. Check BMP in one week, f/u 2-3 weeks to consider resuming [place] PA-C 04/09/2018 2:44 PM

## 2018-04-09 NOTE — Assessment & Plan Note (Signed)
MDT-gen change 2015

## 2018-04-09 NOTE — Assessment & Plan Note (Signed)
Prior PCIs- last cath 2010- medical Rx

## 2018-04-09 NOTE — Assessment & Plan Note (Signed)
Pt admitted 04/03/18 with acute renal injury secondary to medications and low output.  Renal function improving, B/P better. Will resume low dose Entresto, check BMP in one week, f/u in 2-3 weeks.

## 2018-04-09 NOTE — Assessment & Plan Note (Signed)
CAF, paced

## 2018-04-09 NOTE — Patient Instructions (Signed)
Medication Instructions:  Your physician has recommended you make the following change in your medication:  START ENTRESTO 24-26 MG ONE TABLET BY MOUTH TWICE DAILY.  If you need a refill on your cardiac medications before your next appointment, please call your pharmacy.   Lab work: Your physician recommends that you return for lab work in 2 WEEKS: BMET  If you have labs (blood work) drawn today and your tests are completely normal, you will receive your results only by: [name] MyChart Message (if you have MyChart) OR . A paper copy in the mail If you have any lab test that is abnormal or we need to change your treatment, we will call you to review the results.  Testing/Procedures: NONE  Follow-Up: At [facility], you and your health needs are our priority.  As part of our continuing mission to provide you with exceptional heart care, we have created designated Provider Care Teams.  These Care Teams include your primary Cardiologist (physician) and Advanced Practice Providers (APPs -  Physician Assistants and Nurse Practitioners) who all work together to provide you with the care you need, when you need it. You will need a follow up appointment in 3 weeks [name], PA.

## 2018-04-09 NOTE — Assessment & Plan Note (Signed)
Treated with chemo and radiation- [facility]

## 2018-04-11 NOTE — Progress Notes (Signed)
Remote ICD transmission.   

## 2018-04-16 ENCOUNTER — Other Ambulatory Visit: Payer: Self-pay | Admitting: Internal Medicine

## 2018-04-16 MED ORDER — AMIODARONE HCL 200 MG PO TABS: 200.0000 mg | ORAL_TABLET | Freq: Every day | ORAL | 2 refills | Status: DC

## 2018-04-18 ENCOUNTER — Telehealth: Payer: Self-pay | Admitting: Cardiology

## 2018-04-18 NOTE — Telephone Encounter (Signed)
New message  Pt c/o swelling: STAT is pt has developed SOB within 24 hours  1) How much weight have you gained and in what time span? Patient does not know   2) If swelling, where is the swelling located? Lower legs   3) Are you currently taking a fluid pill? No   4) Are you currently SOB?yes    Do you have a log of your daily weights (if so, list)? No   5) Have you gained 3 pounds in a day or 5 pounds in a week? Patient does not know    6) Have you traveled recently? No

## 2018-04-18 NOTE — Telephone Encounter (Signed)
Would see if the patient can be worked into see an APP this week for follow-up evaluation

## 2018-04-18 NOTE — Telephone Encounter (Signed)
SOB worse in the past 2-3 days. Also c/o that sob is causing him sleeping problems and is unable to lay back in bed. Recently taken off fluid medications. Also said that his legs and feet are swollen. Also has a non-productive cough. Denies n/v, fever, chills, chest pain or dizziness. Did recent lab work on this Monday 04/16/18 at [place]. Recent lab work requested. Medications reconciled. Advised patient that message would be sent to DOD for further advice and if his symptoms get worse between now and when he is called back to go to the ED for an evaluation. Verbalized understanding of plan.

## 2018-04-19 ENCOUNTER — Encounter: Payer: Self-pay | Admitting: Cardiology

## 2018-04-19 ENCOUNTER — Ambulatory Visit (INDEPENDENT_AMBULATORY_CARE_PROVIDER_SITE_OTHER): Payer: Medicare PPO | Admitting: Cardiology

## 2018-04-19 VITALS — BP 104/68 | HR 74 | Ht 62.0 in | Wt 149.6 lb

## 2018-04-19 DIAGNOSIS — C3490 Malignant neoplasm of unspecified part of unspecified bronchus or lung: Secondary | ICD-10-CM | POA: Diagnosis not present

## 2018-04-19 DIAGNOSIS — Z95810 Presence of automatic (implantable) cardiac defibrillator: Secondary | ICD-10-CM | POA: Diagnosis not present

## 2018-04-19 DIAGNOSIS — I5023 Acute on chronic systolic (congestive) heart failure: Secondary | ICD-10-CM | POA: Diagnosis not present

## 2018-04-19 MED ORDER — FUROSEMIDE 80 MG PO TABS: 80.0000 mg | ORAL_TABLET | Freq: Every day | ORAL | 1 refills | Status: DC

## 2018-04-19 NOTE — Telephone Encounter (Signed)
Scheduled appointment with [place] today 04/19/18 @10 :30. [place] to give appt information.

## 2018-04-19 NOTE — Patient Instructions (Addendum)
Medication Instructions:  Your physician has recommended you make the following change in your medication:  1) STOP Entresto 2) START Lasix 80mg  daily. An Rx has been sent to your pharmacy. Take the first dose when your return home today.  If you need a refill on your cardiac medications before your next appointment, please call your pharmacy.   Lab work: None odered If you have labs (blood work) drawn today and your tests are completely normal, you will receive your results only by: [name] MyChart Message (if you have MyChart) OR . A paper copy in the mail If you have any lab test that is abnormal or we need to change your treatment, we will call you to review the results.  Testing/Procedures: None ordered  Follow-Up: At [facility], you and your health needs are our priority.  As part of our continuing mission to provide you with exceptional heart care, we have created designated Provider Care Teams.  These Care Teams include your primary Cardiologist (physician) and Advanced Practice Providers (APPs -  Physician Assistants and Nurse Practitioners) who all work together to provide you with the care you need, when you need it.  You have an appointment scheduled with Kerin Ransom, PA on 04/24/18 @ 11:30am

## 2018-04-19 NOTE — Telephone Encounter (Signed)
Patient informed and verbalized understanding. Aware of appointment set for today with [facility].

## 2018-04-19 NOTE — Progress Notes (Signed)
04/19/2018 Michael Krueger   1955-10-22  [id number]  Primary Physician System, Pcp Not In Primary Cardiologist: Dr Stanford Breed  HPI:  The patient is a pleasant 62 y.o.male with a history of ICM-EF 15% by echo march 2018, CAD with a history of prior PCIs, chronic systolic heart failure, history of V. tach status post ablation and ICD,  and AF on amiodarone and Eliquis. Most recent echo March 2018 revealed an ejection fraction of 15% with grade 2 diastolic dysfunction, mild MR, and severe LAE. He has an ICD in place, MDT CRTD implanted 2010, gen change 2015. He was diagnosed with lung cancer this past Spring and was treated with chemo and radiation (no surgery). He finished his chemo in Aug and his radiation Rx at the end of Oct.    He ended up at [facility] 04/03/18 with nausea, hypotension, and AKI.  He was admitted and his Coreg, Lasix, K+, and Entresto were held.  His SCr drifted down, it was 1.6 on 12/06, baseline is 1.1.  He was seen in the office 04/09/18 for follow up.  He was doing better and I resumed his Entresto with plans to see him back in a week or so to resume Coreg and Lasix if needed.  In the meantime he has developed increasing DOE, increased weight, and LE edema. He is seen in the office today as an add on. He has mild SOB at rest.  His O2 sat is 88%.  I offered admission but he declined.    Current Outpatient Medications  Medication Sig Dispense Refill  . acetaminophen (TYLENOL) 500 MG tablet Take 1,000 mg by mouth daily as needed for headache.     [name] amiodarone (PACERONE) 200 MG tablet Take 1 tablet (200 mg total) by mouth daily. 90 tablet 2  . atorvastatin (LIPITOR) 80 MG tablet TAKE 1 TABLET (80 MG TOTAL) BY MOUTH DAILY. 90 tablet 3  . cycloSPORINE (RESTASIS) 0.05 % ophthalmic emulsion Place 1 drop into both eyes 2 (two) times daily.    [name] ELIQUIS 5 MG TABS tablet TAKE 1 TABLET BY MOUTH TWICE A DAY 180 tablet 1  . metFORMIN (GLUCOPHAGE) 850 MG tablet Take 850 mg by mouth  daily.     . ondansetron (ZOFRAN) 8 MG tablet Take 8 mg by mouth every 8 (eight) hours as needed for nausea or vomiting.    . pantoprazole (PROTONIX) 40 MG tablet Take 1 tablet by mouth Daily.    [name] PARoxetine (PAXIL) 20 MG tablet Take 20 mg by mouth daily.    . traZODone (DESYREL) 50 MG tablet Take 50 mg by mouth daily as needed for sleep.     . furosemide (LASIX) 80 MG tablet Take 1 tablet (80 mg total) by mouth daily. 30 tablet 1   No current facility-administered medications for this visit.     Allergies  Allergen Reactions  . Other Itching and Other (See Comments)    CHG wipes/  Caused a rash    Past Medical History:  Diagnosis Date  . Atrial fibrillation ([place])   . AV BLOCK, COMPLETE   . CAD   . Chronic systolic heart failure ([place])   . Gynecomastia   . HYPERLIPIDEMIA-MIXED   . HYPERTHYROIDISM   . Implantable cardiac defibrillator  medtronic    Initially related 1990s, upgrade to dual-chamber 2002, generator change 2005, generator change August 2010, hematoma evacuation September 2010.  . Ischemic cardiomyopathy   . Long term current use of anticoagulant   . OBESITY-MORBID (>  100')   . TRANSAMINASES, SERUM, ELEVATED   . VENTRICULAR TACHYCARDIA    S/P RFCA x3  2006, 2010,2010    Social History   Socioeconomic History  . Marital status: Single    Spouse name: Not on file  . Number of children: Not on file  . Years of education: Not on file  . Highest education level: Not on file  Occupational History  . Not on file  Social Needs  . Financial resource strain: Not on file  . Food insecurity:    Worry: Not on file    Inability: Not on file  . Transportation needs:    Medical: Not on file    Non-medical: Not on file  Tobacco Use  . Smoking status: Former Research scientist (life sciences)  . Smokeless tobacco: Never Used  Substance and Sexual Activity  . Alcohol use: Yes  . Drug use: No  . Sexual activity: Not on file  Lifestyle  . Physical activity:    Days per week: Not on file     Minutes per session: Not on file  . Stress: Not on file  Relationships  . Social connections:    Talks on phone: Not on file    Gets together: Not on file    Attends religious service: Not on file    Active member of club or organization: Not on file    Attends meetings of clubs or organizations: Not on file    Relationship status: Not on file  . Intimate partner violence:    Fear of current or ex partner: Not on file    Emotionally abused: Not on file    Physically abused: Not on file    Forced sexual activity: Not on file  Other Topics Concern  . Not on file  Social History Narrative  . Not on file     Family History  Problem Relation Age of Onset  . Cancer Mother   . Heart attack Maternal Grandfather   . Stroke Paternal Grandfather   . Hypertension Sister      Review of Systems: General: negative for chills, fever, night sweats or weight changes.  Cardiovascular: negative for chest pain, dyspnea on exertion, edema, orthopnea, palpitations, paroxysmal nocturnal dyspnea or shortness of breath Dermatological: negative for rash Respiratory: negative for cough or wheezing Urologic: negative for hematuria Abdominal: negative for nausea, vomiting, diarrhea, bright red blood per rectum, melena, or hematemesis Neurologic: negative for visual changes, syncope, or dizziness All other systems reviewed and are otherwise negative except as noted above.    Blood pressure 104/68, pulse 74, height 5\' 2"  (1.575 m), weight 149 lb 9.6 oz (67.9 kg).  General appearance: alert, cooperative and no distress Lungs: diffuse rhonchi Heart: regular rate and rhythm Extremities: 1+ pitting edema Skin: pale, dry Neurologic: Grossly normal   ASSESSMENT AND PLAN:   Acute on chronic systolic CHF The patient is volume overloaded on exam.   Automatic implantable cardioverter-defibrillator in situ MDT-gen change 2015  CAD (coronary artery disease), native coronary artery Prior PCIs- last  cath 2010- medical Rx  ATRIAL FIBRILLATION CAF, paced  SCLC (small cell lung carcinoma) (HCC) Treated with chemo and radiation- [facility]  AKI (acute kidney injury) ([place]) Pt admitted 04/03/18 with acute renal injury secondary to medications and low output.    PLAN  He declined admission. I stopped his Entresto (K+ 5.3) and added back Lasix 80 mg daily, pre admission dose was 80 mg BID. I'll see him back in 4 days.  He knows to  go to the ED if he gets worse.  Long term he would be a good candidate for the CHF clinic.   Kerin Ransom PA-C 04/19/2018 10:54 AM

## 2018-04-24 ENCOUNTER — Encounter: Payer: Self-pay | Admitting: Cardiology

## 2018-04-24 ENCOUNTER — Ambulatory Visit (INDEPENDENT_AMBULATORY_CARE_PROVIDER_SITE_OTHER): Payer: Medicare PPO | Admitting: Cardiology

## 2018-04-24 VITALS — BP 110/58 | HR 76 | Ht 62.0 in | Wt 147.2 lb

## 2018-04-24 DIAGNOSIS — E875 Hyperkalemia: Secondary | ICD-10-CM

## 2018-04-24 DIAGNOSIS — I255 Ischemic cardiomyopathy: Secondary | ICD-10-CM

## 2018-04-24 DIAGNOSIS — I2510 Atherosclerotic heart disease of native coronary artery without angina pectoris: Secondary | ICD-10-CM | POA: Diagnosis not present

## 2018-04-24 DIAGNOSIS — I5023 Acute on chronic systolic (congestive) heart failure: Secondary | ICD-10-CM

## 2018-04-24 DIAGNOSIS — Z9861 Coronary angioplasty status: Secondary | ICD-10-CM | POA: Diagnosis not present

## 2018-04-24 NOTE — Assessment & Plan Note (Signed)
EF 15% March 2018

## 2018-04-24 NOTE — Assessment & Plan Note (Signed)
K+ 5.3 when Entresto resumed 04/03/18 after an admission for acute on CRI-  The Entresto was stopped based on this result.

## 2018-04-24 NOTE — Patient Instructions (Signed)
Medication Instructions:  Not needed If you need a refill on your cardiac medications before your next appointment, please call your pharmacy.   Lab work: BNP BMP If you have labs (blood work) drawn today and your tests are completely normal, you will receive your results only by: [name] MyChart Message (if you have MyChart) OR . A paper copy in the mail If you have any lab test that is abnormal or we need to change your treatment, we will call you to review the results.  Testing/Procedures: NOT NEEDED  Follow-Up: At [facility], you and your health needs are our priority.  As part of our continuing mission to provide you with exceptional heart care, we have created designated Provider Care Teams.  These Care Teams include your primary Cardiologist (physician) and Advanced Practice Providers (APPs -  Physician Assistants and Nurse Practitioners) who all work together to provide you with the care you need, when you need it. You will need a follow up appointment in 1 months.  Please call our office 2 months in advance to schedule this appointment.  You may see Kirk Ruths, MD or one of the following Advanced Practice Providers on your designated Care Team:   Kerin Ransom, PA-C Roby Lofts, [place] . Sande Rives, PA-C  Any Other Special Instructions Will Be Listed Below (If Applicable).

## 2018-04-24 NOTE — Progress Notes (Signed)
04/24/2018 JABRE HEO   12-21-1955  [id number]  Primary Physician System, Pcp Not In Primary Cardiologist: Dr Elesa Hacker EP  HPI:  The patient is apleasant 62y.o.male with a historyof ICM-EF 15% by echo March 2018,CAD with a history of prior PCIs, chronic systolic heart failure, history of V. tach status post ablation and ICD, and AFon amiodarone and Eliquis. Most recent echoMarch 2018revealed an ejection fraction of 15% with grade 2 diastolic dysfunction, mild MR, and severe LAE.  He has an ICD in place, MDT CRTD implanted 2010, gen change 2015.  He was diagnosed with lung cancer this past Spring and was treated with chemo and radiation (no surgery).  He finished his chemo in Aug and his radiation Rx at the end of Oct.   He ended up at [facility] 04/03/18 with nausea, hypotension, and AKI. He was admitted and his Coreg, Lasix, K+, and Entresto were held. His SCr drifted down, it was 1.6 on 12/06, baseline is 1.1. He was seen in the office 04/09/18 for follow up. He was doing better and I resumed his Entresto with plans to see him back in a week or so to resume Coreg and Lasix if needed.    In the meantime he developed increasing DOE, increased weight, and LE edema as well as hyperkalemia.  He was seen in the office 04/19/18 and his Delene Loll stopped and Lasix 80 mg daily resumed (he declined admission).  He is back in the office today for follow up.  He says he feels much better, less SOB.     Current Outpatient Medications  Medication Sig Dispense Refill  . acetaminophen (TYLENOL) 500 MG tablet Take 1,000 mg by mouth daily as needed for headache.     [name] amiodarone (PACERONE) 200 MG tablet Take 1 tablet (200 mg total) by mouth daily. 90 tablet 2  . atorvastatin (LIPITOR) 80 MG tablet TAKE 1 TABLET (80 MG TOTAL) BY MOUTH DAILY. 90 tablet 3  . cycloSPORINE (RESTASIS) 0.05 % ophthalmic emulsion Place 1 drop into both eyes 2 (two) times daily.    [name] ELIQUIS 5 MG TABS  tablet TAKE 1 TABLET BY MOUTH TWICE A DAY 180 tablet 1  . furosemide (LASIX) 80 MG tablet Take 1 tablet (80 mg total) by mouth daily. 30 tablet 1  . metFORMIN (GLUCOPHAGE) 850 MG tablet Take 850 mg by mouth daily.     . pantoprazole (PROTONIX) 40 MG tablet Take 1 tablet by mouth Daily.    [name] PARoxetine (PAXIL) 20 MG tablet Take 20 mg by mouth daily.    . traZODone (DESYREL) 50 MG tablet Take 50 mg by mouth daily as needed for sleep.      No current facility-administered medications for this visit.     Allergies  Allergen Reactions  . Other Itching and Other (See Comments)    CHG wipes/  Caused a rash    Past Medical History:  Diagnosis Date  . Atrial fibrillation ([place])   . AV BLOCK, COMPLETE   . CAD   . Chronic systolic heart failure ([place])   . Gynecomastia   . HYPERLIPIDEMIA-MIXED   . HYPERTHYROIDISM   . Implantable cardiac defibrillator  medtronic    Initially related 1990s, upgrade to dual-chamber 2002, generator change 2005, generator change August 2010, hematoma evacuation September 2010.  . Ischemic cardiomyopathy   . Long term current use of anticoagulant   . OBESITY-MORBID (>100')   . TRANSAMINASES, SERUM, ELEVATED   . VENTRICULAR TACHYCARDIA  S/P RFCA x3  2006, 2010,2010    Social History   Socioeconomic History  . Marital status: Single    Spouse name: Not on file  . Number of children: Not on file  . Years of education: Not on file  . Highest education level: Not on file  Occupational History  . Not on file  Social Needs  . Financial resource strain: Not on file  . Food insecurity:    Worry: Not on file    Inability: Not on file  . Transportation needs:    Medical: Not on file    Non-medical: Not on file  Tobacco Use  . Smoking status: Former Research scientist (life sciences)  . Smokeless tobacco: Never Used  Substance and Sexual Activity  . Alcohol use: Yes  . Drug use: No  . Sexual activity: Not on file  Lifestyle  . Physical activity:    Days per week: Not on file     Minutes per session: Not on file  . Stress: Not on file  Relationships  . Social connections:    Talks on phone: Not on file    Gets together: Not on file    Attends religious service: Not on file    Active member of club or organization: Not on file    Attends meetings of clubs or organizations: Not on file    Relationship status: Not on file  . Intimate partner violence:    Fear of current or ex partner: Not on file    Emotionally abused: Not on file    Physically abused: Not on file    Forced sexual activity: Not on file  Other Topics Concern  . Not on file  Social History Narrative  . Not on file     Family History  Problem Relation Age of Onset  . Cancer Mother   . Heart attack Maternal Grandfather   . Stroke Paternal Grandfather   . Hypertension Sister      Review of Systems: General: negative for chills, fever, night sweats or weight changes.  Cardiovascular: negative for chest pain, edema, orthopnea, palpitations, paroxysmal nocturnal  Dermatological: negative for rash Respiratory: negative for cough or wheezing Urologic: negative for hematuria Abdominal: negative for nausea, vomiting, diarrhea, bright red blood per rectum, melena, or hematemesis Neurologic: negative for visual changes, syncope, or dizziness All other systems reviewed and are otherwise negative except as noted above.    Blood pressure (!) 110/58, pulse 76, height 5\' 2"  (1.575 m), weight 147 lb 3.2 oz (66.8 kg).  General appearance: alert, cooperative and no distress Lungs: scattered rhonchi Heart: regular rate and rhythm Extremities: no edema Skin: warm and dry Neurologic: Grossly normal  EKG-AV paced  ASSESSMENT AND PLAN:   Acute on chronic systolic CHF The patient appears improved after the addition of Lasix 80 mg QD.  ICM EF 15% March 2018  Hyperkalemia K+ 5.3 when Entresto resumed 04/03/18 after an admission for acute on CRI-  The Entresto was stopped based on this  result.  Automatic implantable cardioverter-defibrillator in situ MDT-gen change 2015  CAD (coronary artery disease), native coronary artery Prior PCIs- last cath 2010- medical Rx  ATRIAL FIBRILLATION CAF, paced  SCLC (small cell lung carcinoma) (HCC) Treated with chemo and radiation- [facility]  AKI (acute kidney injury) ([place]) Pt admitted 04/03/18 with acute renal injury secondary to medications and low output.  PLAN  Same Rx- check f/u BNP and BMP today. ? CHF referral will;l discuss with Dr Stanford Breed.   Kerin Ransom PA-C  04/24/2018 11:50 AM

## 2018-04-25 LAB — BASIC METABOLIC PANEL
BUN/Creatinine Ratio: 13 (ref 10–24)
BUN: 20 mg/dL (ref 8–27)
CO2: 23 mmol/L (ref 20–29)
Calcium: 9.5 mg/dL (ref 8.6–10.2)
Chloride: 97 mmol/L (ref 96–106)
Creatinine, Ser: 1.52 mg/dL — ABNORMAL HIGH (ref 0.76–1.27)
GFR calc Af Amer: 56 mL/min/{1.73_m2} — ABNORMAL LOW (ref >59)
GFR calc non Af Amer: 48 mL/min/{1.73_m2} — ABNORMAL LOW (ref >59)
Glucose: 101 mg/dL — ABNORMAL HIGH (ref 65–99)
Potassium: 4.7 mmol/L (ref 3.5–5.2)
Sodium: 135 mmol/L (ref 134–144)

## 2018-04-25 LAB — BRAIN NATRIURETIC PEPTIDE: BNP: 859 pg/mL — ABNORMAL HIGH (ref 0.0–100.0)

## 2018-05-07 ENCOUNTER — Ambulatory Visit: Payer: Medicare PPO | Admitting: Cardiology

## 2018-05-16 NOTE — Progress Notes (Signed)
HPI: FU CAD, ischemic cardiomyopathy, systolic HF, status post BiV-ICD, V. tach status post ablation, atrial fibrillation on amiodarone.  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  Patient admitted December 2019 with hypotension felt secondary to dehydration.  Also with renal insufficiency.  Patient was given IV fluids and medications held.  He improved.  He developed increased CHF symptoms off of all medications and Lasix was resumed. Since last seen,he denies dyspnea, chest pain, palpitations or syncope.  His nausea and vomiting have improved and his p.o. intake has increased.    Studies/Reports Reviewed Today:  Echo3/18 -EF 15, grade 2 DD, mild MR, severe LAE  Myoview 08/2011 Large area of scar in the anterior, anteroseptal, inferoseptal, inferior, anterolateral and apical distributions.  No signif ischemia. LV Ejection Fraction: 14%. LV Wall Motion: Severe diffuse hypokinesis, inferior, apical akinesis.  Cardiac catheterization 10/2008 LM: Okay LAD: Stent patent with 30% ISR, mid and distal 30% LCx: Tiny marginal branch 80% proximal, proximal to this stent patent with 30% ISR, mid CFX 40% RCA: 70% after RV branch then occluded (bridging collaterals) EF 10-15%  Current Outpatient Medications  Medication Sig Dispense Refill  . acetaminophen (TYLENOL) 500 MG tablet Take 1,000 mg by mouth daily as needed for headache.     [name] amiodarone (PACERONE) 200 MG tablet Take 1 tablet (200 mg total) by mouth daily. 90 tablet 2  . atorvastatin (LIPITOR) 80 MG tablet TAKE 1 TABLET (80 MG TOTAL) BY MOUTH DAILY. 90 tablet 3  . cycloSPORINE (RESTASIS) 0.05 % ophthalmic emulsion Place 1 drop into both eyes 2 (two) times daily.    [name] ELIQUIS 5 MG TABS tablet TAKE 1 TABLET BY MOUTH TWICE A DAY 180 tablet 1  . furosemide (LASIX) 80 MG tablet Take 1 tablet (80 mg total) by mouth daily. 30 tablet 1  . metFORMIN (GLUCOPHAGE) 850 MG tablet Take 850 mg by mouth daily.     .  pantoprazole (PROTONIX) 40 MG tablet Take 1 tablet by mouth Daily.    [name] PARoxetine (PAXIL) 20 MG tablet Take 20 mg by mouth daily.    . traZODone (DESYREL) 50 MG tablet Take 50 mg by mouth daily as needed for sleep.      No current facility-administered medications for this visit.      Past Medical History:  Diagnosis Date  . Atrial fibrillation ([place])   . AV BLOCK, COMPLETE   . CAD   . Chronic systolic heart failure ([place])   . Gynecomastia   . HYPERLIPIDEMIA-MIXED   . HYPERTHYROIDISM   . Implantable cardiac defibrillator  medtronic    Initially related 1990s, upgrade to dual-chamber 2002, generator change 2005, generator change August 2010, hematoma evacuation September 2010.  . Ischemic cardiomyopathy   . Long term current use of anticoagulant   . OBESITY-MORBID (>100')   . TRANSAMINASES, SERUM, ELEVATED   . VENTRICULAR TACHYCARDIA    S/P RFCA x3  2006, 2010,2010    Past Surgical History:  Procedure Laterality Date  . IMPLANTABLE CARDIOVERTER DEFIBRILLATOR GENERATOR CHANGE N/A 10/23/2013   Procedure: IMPLANTABLE CARDIOVERTER DEFIBRILLATOR GENERATOR CHANGE;  Surgeon: Deboraha Sprang, MD;  Location[facility] C[facility] LAB;  Service: Cardiovascular;  Laterality: N/A;  . SPLENECTOMY    . Status post Medtronic Concerto CRT-D in August 2010 with pocket revision      Social History   Socioeconomic History  . Marital status: Single    Spouse name: Not on file  . Number of children: Not on file  .  Years of education: Not on file  . Highest education level: Not on file  Occupational History  . Not on file  Social Needs  . Financial resource strain: Not on file  . Food insecurity:    Worry: Not on file    Inability: Not on file  . Transportation needs:    Medical: Not on file    Non-medical: Not on file  Tobacco Use  . Smoking status: Former Research scientist (life sciences)  . Smokeless tobacco: Never Used  Substance and Sexual Activity  . Alcohol use: Yes  . Drug use: No  . Sexual activity: Not on file    Lifestyle  . Physical activity:    Days per week: Not on file    Minutes per session: Not on file  . Stress: Not on file  Relationships  . Social connections:    Talks on phone: Not on file    Gets together: Not on file    Attends religious service: Not on file    Active member of club or organization: Not on file    Attends meetings of clubs or organizations: Not on file    Relationship status: Not on file  . Intimate partner violence:    Fear of current or ex partner: Not on file    Emotionally abused: Not on file    Physically abused: Not on file    Forced sexual activity: Not on file  Other Topics Concern  . Not on file  Social History Narrative  . Not on file    Family History  Problem Relation Age of Onset  . Cancer Mother   . Heart attack Maternal Grandfather   . Stroke Paternal Grandfather   . Hypertension Sister     ROS: no fevers or chills, productive cough, hemoptysis, dysphasia, odynophagia, melena, hematochezia, dysuria, hematuria, rash, seizure activity, orthopnea, PND, pedal edema, claudication. Remaining systems are negative.  Physical Exam: Well-developed well-nourished in no acute distress.  Skin is warm and dry.  HEENT is normal.  Neck is supple.  Chest is clear to auscultation with normal expansion.  Cardiovascular exam is regular rate and rhythm.  Abdominal exam nontender or distended. No masses palpated. Extremities show no edema. neuro grossly intact   A/P  1 chronic systolic congestive heart failure-patient recently admitted because of hypotension and dehydration felt secondary to poor p.o. intake, nausea and vomiting while undergoing radiation and chemotherapy for lung cancer.  He is euvolemic on examination today.  I will continue present dose of Lasix.  Continue low-sodium diet.  He can take additional 40 mg of Lasix for increasing edema or weight gain of 2 to 3 pounds.  2 ischemic cardiomyopathy-repeat echocardiogram.  Blood pressure has  been running lower since being treated for his lung cancer.  Not clear that he will tolerate Entresto at this point.  I will try low-dose losartan 25 mg daily and follow blood pressure.  We can consider resuming Entresto in the future if blood pressure allows.  I will also add carvedilol 3.125 mg twice daily.  Check potassium and renal function in 1 week.  3 paroxysmal atrial fibrillation-plan to continue present dose of amiodarone.  He remains in sinus rhythm on examination.  Continue apixaban.  Recent liver functions normal.  Check TSH.  4 coronary artery disease-plan to continue statin.  No aspirin given need for anticoagulation.  He is not having chest pain.  5 prior ICD-followed by electrophysiology.  6 ventricular tachycardia-continue present dose of amiodarone.  7 hyperlipidemia-continue statin.  Kirk Ruths, MD

## 2018-05-22 ENCOUNTER — Encounter: Payer: Self-pay | Admitting: Cardiology

## 2018-05-22 ENCOUNTER — Ambulatory Visit (INDEPENDENT_AMBULATORY_CARE_PROVIDER_SITE_OTHER): Payer: Medicare PPO | Admitting: Cardiology

## 2018-05-22 VITALS — BP 108/66 | HR 78 | Ht 62.0 in | Wt 149.0 lb

## 2018-05-22 DIAGNOSIS — I480 Paroxysmal atrial fibrillation: Secondary | ICD-10-CM | POA: Diagnosis not present

## 2018-05-22 DIAGNOSIS — E7800 Pure hypercholesterolemia, unspecified: Secondary | ICD-10-CM

## 2018-05-22 DIAGNOSIS — I255 Ischemic cardiomyopathy: Secondary | ICD-10-CM

## 2018-05-22 DIAGNOSIS — I5022 Chronic systolic (congestive) heart failure: Secondary | ICD-10-CM | POA: Diagnosis not present

## 2018-05-22 DIAGNOSIS — I2510 Atherosclerotic heart disease of native coronary artery without angina pectoris: Secondary | ICD-10-CM | POA: Diagnosis not present

## 2018-05-22 MED ORDER — CARVEDILOL 3.125 MG PO TABS: 3.1250 mg | ORAL_TABLET | Freq: Two times a day (BID) | ORAL | 3 refills | Status: DC

## 2018-05-22 MED ORDER — LOSARTAN POTASSIUM 25 MG PO TABS: 25.0000 mg | ORAL_TABLET | Freq: Every day | ORAL | 3 refills | Status: DC

## 2018-05-22 NOTE — Patient Instructions (Addendum)
Medication Instructions:  START LOSARTAN 25 MG ONCE DAILY  START CARVEDILOL 3.125 MG TWICE DAILY If you need a refill on your cardiac medications before your next appointment, please call your pharmacy.   Lab work: Your physician recommends that you HAVE LAB WORK IN ONE WEEK If you have labs (blood work) drawn today and your tests are completely normal, you will receive your results only by: [name] MyChart Message (if you have MyChart) OR . A paper copy in the mail If you have any lab test that is abnormal or we need to change your treatment, we will call you to review the results.  Testing/Procedures: Your physician has requested that you have an echocardiogram. Echocardiography is a painless test that uses sound waves to create images of your heart. It provides your doctor with information about the size and shape of your heart and how well your heart's chambers and valves are working. This procedure takes approximately one hour. There are no restrictions for this procedure.  [place]  Follow-Up: At [facility], you and your health needs are our priority.  As part of our continuing mission to provide you with exceptional heart care, we have created designated Provider Care Teams.  These Care Teams include your primary Cardiologist (physician) and Advanced Practice Providers (APPs -  Physician Assistants and Nurse Practitioners) who all work together to provide you with the care you need, when you need it. . You will need a follow up appointment in [place]

## 2018-05-23 ENCOUNTER — Ambulatory Visit (INDEPENDENT_AMBULATORY_CARE_PROVIDER_SITE_OTHER): Payer: Medicare PPO | Admitting: Internal Medicine

## 2018-05-23 ENCOUNTER — Encounter: Payer: Self-pay | Admitting: Internal Medicine

## 2018-05-23 VITALS — BP 108/60 | HR 71 | Ht 62.0 in | Wt 151.0 lb

## 2018-05-23 DIAGNOSIS — I255 Ischemic cardiomyopathy: Secondary | ICD-10-CM

## 2018-05-23 DIAGNOSIS — I472 Ventricular tachycardia: Secondary | ICD-10-CM | POA: Diagnosis not present

## 2018-05-23 DIAGNOSIS — Z95810 Presence of automatic (implantable) cardiac defibrillator: Secondary | ICD-10-CM

## 2018-05-23 DIAGNOSIS — I4729 Other ventricular tachycardia: Secondary | ICD-10-CM

## 2018-05-23 DIAGNOSIS — I5022 Chronic systolic (congestive) heart failure: Secondary | ICD-10-CM

## 2018-05-23 DIAGNOSIS — I480 Paroxysmal atrial fibrillation: Secondary | ICD-10-CM

## 2018-05-23 NOTE — Patient Instructions (Signed)
Medication Instructions:  Your physician recommends that you continue on your current medications as directed. Please refer to the Current Medication list given to you today.  Labwork: Please have a TSH and LFT drawn at Dr Jacalyn Lefevre office.  Testing/Procedures: None ordered.  Follow-Up: Your physician recommends that you schedule a follow-up appointment in:   One Year with Dr Caryl Comes  Any Other Special Instructions Will Be Listed Below (If Applicable).     If you need a refill on your cardiac medications before your next appointment, please call your pharmacy.

## 2018-05-23 NOTE — Progress Notes (Signed)
Patient Care Team: System, Pcp Not In as PCP - General Crenshaw, Denice Bors, MD as PCP - Cardiology (Cardiology) Lissa Morales, MD as PCP - Hematology/Oncology (Internal Medicine) Deboraha Sprang, MD as PCP - Electrophysiology (Cardiology) Linnell Fulling, NP (Hematology and Oncology)   HPI  Michael Krueger is a 63 y.o. male Seen in followup for VT with ICD-CRT implantation   for congestive heart failure in the setting of ischemic heart cardiomyopathy  He has a history of ventricular tachycardia status post ablation. He also has a history of atrial fibrillation and he takes amiodarone for the combination of the 2. . He underwent later generator replacement 10/23/13   Last cath performed in July of 2010 and revealed an occluded RCA but nonobstructive disease in the Lcx and LAD other than ostial 80 in a small marginal;    DATE TEST EF   6/15 Echo   15 %   3/18 Echo   15 % LAE severe        9/19 developed recurrent episodes of ventricular tachycardia requiring frequent ATP for termination.  None since mid September.  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  12/19 admitted with dehydration with renal insufficiency and potassium shift.  Energy level is gradually improving.  Able to walk 100 yards.  No edema.  No chest pain.  No syncope.  Past Medical History:  Diagnosis Date  . Atrial fibrillation ([place])   . AV BLOCK, COMPLETE   . CAD   . Chronic systolic heart failure ([place])   . Gynecomastia   . HYPERLIPIDEMIA-MIXED   . HYPERTHYROIDISM   . Implantable cardiac defibrillator  medtronic    Initially related 1990s, upgrade to dual-chamber 2002, generator change 2005, generator change August 2010, hematoma  evacuation September 2010.  . Ischemic cardiomyopathy   . Long term current use of anticoagulant   . OBESITY-MORBID (>100')   . TRANSAMINASES, SERUM, ELEVATED   . VENTRICULAR TACHYCARDIA    S/P RFCA x3  2006, 2010,2010    Past Surgical History:  Procedure Laterality Date  . IMPLANTABLE CARDIOVERTER DEFIBRILLATOR GENERATOR CHANGE N/A 10/23/2013   Procedure: IMPLANTABLE CARDIOVERTER DEFIBRILLATOR GENERATOR CHANGE;  Surgeon: Deboraha Sprang, MD;  Location: [facility] CATH LAB;  Service: Cardiovascular;  Laterality: N/A;  . SPLENECTOMY    . Status post Medtronic Concerto CRT-D in August 2010 with pocket revision      Current Outpatient Medications  Medication Sig Dispense Refill  . acetaminophen (TYLENOL) 500 MG tablet Take 1,000 mg by mouth daily as needed for headache.     [name] amiodarone (PACERONE) 200 MG tablet Take 1 tablet (200 mg total) by mouth daily. 90 tablet 2  . atorvastatin (LIPITOR) 80 MG tablet TAKE 1 TABLET (80 MG TOTAL) BY MOUTH DAILY. 90 tablet 3  . carvedilol (COREG) 3.125 MG tablet Take 1 tablet (3.125 mg total) by mouth 2 (two) times daily. 180 tablet 3  . cycloSPORINE (RESTASIS) 0.05 % ophthalmic emulsion Place 1 drop into both eyes 2 (two) times daily.    [name] ELIQUIS 5 MG TABS  tablet TAKE 1 TABLET BY MOUTH TWICE A DAY 180 tablet 1  . furosemide (LASIX) 80 MG tablet Take 1 tablet (80 mg total) by mouth daily. 30 tablet 1  . losartan (COZAAR) 25 MG tablet Take 1 tablet (25 mg total) by mouth daily. 90 tablet 3  . metFORMIN (GLUCOPHAGE) 850 MG tablet Take 850 mg by mouth daily.     . pantoprazole (PROTONIX) 40 MG tablet Take 1 tablet by mouth Daily.    [name] PARoxetine (PAXIL) 20 MG tablet Take 20 mg by mouth daily.    . traZODone (DESYREL) 50 MG tablet Take 50 mg by mouth daily as needed for sleep.      No current facility-administered medications for this visit.     Allergies  Allergen Reactions  . Other Itching and Other (See Comments)    CHG wipes/  Caused a rash    Review  of Systems negative except from HPI and PMH  Physical Exam BP 108/60   Pulse 71   Ht 5\' 2"  (1.575 m)   Wt 151 lb (68.5 kg)   SpO2 94%   BMI 27.62 kg/m  Well developed and nourished in no acute distress HENT normal Neck supple with JVP-6-8 Clear Regular rate and rhythm, no murmurs or gallops Abd-soft with active BS No Clubbing cyanosis edema Skin-warm and dry A & Oriented  Grossly normal sensory and motor function     ECG demonstrates  AV synchronous pacing with a biventricular pacing configuration  Assessment and  Plan  Congestive heart failure-chronic-systolic  Ventricular tachycardia  Implantable defibrillator CRT Medtronic   Ischemic cardiomyopathy  Shortness of breath  Lead failure  Atrial  Lead elevated threshold newly identified     On Anticoagulation;  No bleeding issues   Without symptoms of ischemia  No intercurrent Ventricular tachycardia since the flurry of ventricular tachycardia in September.  All of these episodes were treated successfully by ATP although the range of ATP cycles range from 3--9.  Because of that the device was reprogrammed to offer a total of 14 episodes.  Atrial lead threshold is mildly elevated.  We have reprogrammed the pulse width so as to keep the output less than 2.5 V.  We will continue amiodarone at the current dose.  Euvolemic   Follow-up labs will be obtained by Dr. Stanford Breed next week.  We spent more than 50% of our >25 min visit in face to face counseling regarding the above

## 2018-05-24 LAB — CUP PACEART INCLINIC DEVICE CHECK
Battery Remaining Longevity: 18 mo
Battery Voltage: 2.91 V
Brady Statistic AP VP Percent: 90.04 %
Brady Statistic AP VS Percent: 0.01 %
Brady Statistic AS VP Percent: 9.95 %
Brady Statistic AS VS Percent: 0 %
Brady Statistic RA Percent Paced: 89.23 %
Brady Statistic RV Percent Paced: 99.92 %
Date Time Interrogation Session: 20200122201644
HighPow Impedance: 34 Ohm
HighPow Impedance: 41 Ohm
Implantable Lead Implant Date: 20000626
Implantable Lead Implant Date: 20000816
Implantable Lead Implant Date: 20100818
Implantable Lead Implant Date: 20100818
Implantable Lead Location: 753858
Implantable Lead Location: 753859
Implantable Lead Location: 753860
Implantable Lead Location: 753860
Implantable Lead Model: 5076
Implantable Lead Model: 6940
Implantable Lead Model: 6945
Implantable Pulse Generator Implant Date: 20150624
Lead Channel Impedance Value: 1083 Ohm
Lead Channel Impedance Value: 266 Ohm
Lead Channel Impedance Value: 304 Ohm
Lead Channel Impedance Value: 399 Ohm
Lead Channel Impedance Value: 437 Ohm
Lead Channel Pacing Threshold Amplitude: 1.5 V
Lead Channel Pacing Threshold Pulse Width: 0.4 ms
Lead Channel Sensing Intrinsic Amplitude: 0.625 mV
Lead Channel Setting Pacing Amplitude: 2.5 V
Lead Channel Setting Pacing Amplitude: 2.5 V
Lead Channel Setting Pacing Amplitude: 2.5 V
Lead Channel Setting Pacing Pulse Width: 0.4 ms
Lead Channel Setting Pacing Pulse Width: 0.6 ms
Lead Channel Setting Sensing Sensitivity: 0.3 mV
MDC IDC MSMT LEADCHNL LV IMPEDANCE VALUE: 1235 Ohm
MDC IDC MSMT LEADCHNL RA SENSING INTR AMPL: 0.75 mV

## 2018-05-29 ENCOUNTER — Ambulatory Visit (HOSPITAL_COMMUNITY): Payer: Medicare PPO | Attending: Cardiology

## 2018-05-29 DIAGNOSIS — I255 Ischemic cardiomyopathy: Secondary | ICD-10-CM | POA: Diagnosis present

## 2018-05-29 MED ORDER — PERFLUTREN LIPID MICROSPHERE
1.0000 mL | INTRAVENOUS | Status: AC | PRN
  Administered 2018-05-29: 2 mL via INTRAVENOUS

## 2018-05-30 LAB — BASIC METABOLIC PANEL
BUN/Creatinine Ratio: 10 (ref 10–24)
BUN: 17 mg/dL (ref 8–27)
CO2: 23 mmol/L (ref 20–29)
Calcium: 9.6 mg/dL (ref 8.6–10.2)
Chloride: 100 mmol/L (ref 96–106)
Creatinine, Ser: 1.65 mg/dL — ABNORMAL HIGH (ref 0.76–1.27)
GFR calc non Af Amer: 44 mL/min/{1.73_m2} — ABNORMAL LOW (ref >59)
GFR, EST AFRICAN AMERICAN: 51 mL/min/{1.73_m2} — AB (ref >59)
Glucose: 82 mg/dL (ref 65–99)
Potassium: 4 mmol/L (ref 3.5–5.2)
Sodium: 140 mmol/L (ref 134–144)

## 2018-05-30 LAB — TSH: TSH: 1.47 u[IU]/mL (ref 0.450–4.500)

## 2018-07-09 ENCOUNTER — Ambulatory Visit (INDEPENDENT_AMBULATORY_CARE_PROVIDER_SITE_OTHER): Payer: Medicare PPO | Admitting: *Deleted

## 2018-07-09 DIAGNOSIS — I4729 Other ventricular tachycardia: Secondary | ICD-10-CM

## 2018-07-09 DIAGNOSIS — I5022 Chronic systolic (congestive) heart failure: Secondary | ICD-10-CM

## 2018-07-09 DIAGNOSIS — I472 Ventricular tachycardia: Secondary | ICD-10-CM | POA: Diagnosis not present

## 2018-07-10 LAB — CUP PACEART REMOTE DEVICE CHECK
Battery Remaining Longevity: 15 mo
Battery Voltage: 2.91 V
Brady Statistic AP VP Percent: 95.06 %
Brady Statistic AP VS Percent: 0.01 %
Brady Statistic AS VP Percent: 4.93 %
Brady Statistic AS VS Percent: 0 %
Brady Statistic RA Percent Paced: 94.97 %
Date Time Interrogation Session: 20200309062823
HIGH POWER IMPEDANCE MEASURED VALUE: 44 Ohm
HighPow Impedance: 35 Ohm
Implantable Lead Implant Date: 20000626
Implantable Lead Implant Date: 20000816
Implantable Lead Implant Date: 20100818
Implantable Lead Implant Date: 20100818
Implantable Lead Location: 753858
Implantable Lead Location: 753859
Implantable Lead Location: 753860
Implantable Lead Location: 753860
Implantable Lead Model: 5076
Implantable Lead Model: 6945
Implantable Pulse Generator Implant Date: 20150624
Lead Channel Impedance Value: 2071 Ohm
Lead Channel Impedance Value: 304 Ohm
Lead Channel Impedance Value: 304 Ohm
Lead Channel Impedance Value: 399 Ohm
Lead Channel Impedance Value: 437 Ohm
Lead Channel Pacing Threshold Amplitude: 1.375 V
Lead Channel Pacing Threshold Pulse Width: 0.4 ms
Lead Channel Sensing Intrinsic Amplitude: 1 mV
Lead Channel Sensing Intrinsic Amplitude: 1 mV
Lead Channel Setting Pacing Amplitude: 2.5 V
Lead Channel Setting Pacing Amplitude: 2.5 V
Lead Channel Setting Pacing Amplitude: 2.5 V
Lead Channel Setting Pacing Pulse Width: 0.4 ms
Lead Channel Setting Pacing Pulse Width: 0.6 ms
Lead Channel Setting Sensing Sensitivity: 0.3 mV
MDC IDC MSMT LEADCHNL LV IMPEDANCE VALUE: 1235 Ohm
MDC IDC STAT BRADY RV PERCENT PACED: 99.84 %

## 2018-07-17 NOTE — Progress Notes (Signed)
Remote ICD transmission.   

## 2018-08-21 ENCOUNTER — Telehealth: Payer: Self-pay | Admitting: Internal Medicine

## 2018-08-21 NOTE — Telephone Encounter (Signed)
Spoke with Diviya - pt has a retained lead and redundant RV lead for pace/sense portion. They will not plan to do MRI. They need guidance for radiation therapy - I sent her my e-mail address to send the form to for completion  Chanetta Yunus, NP 08/21/2018 3:43 PM

## 2018-08-21 NOTE — Telephone Encounter (Signed)
  [place] [facility] tech @ [facility] needs to get some information on the device the patient has before he starts radiation treatment. Patient starts radiation on Thursday. They need to know if his device is MRI compatable. Please call her on her cell provided.

## 2018-08-22 ENCOUNTER — Telehealth: Payer: Self-pay

## 2018-08-22 NOTE — Telephone Encounter (Signed)
I just received a call from [place] from [employer] asking if you have received the email with the form to be completed regarding the patient..  I guess they asked if there was another email, which she needed to send it..  Thank you

## 2018-08-22 NOTE — Telephone Encounter (Signed)
  See previous note from earlier. I also spoke with triage nurse and Legrand Como is checking on it.

## 2018-08-22 NOTE — Telephone Encounter (Signed)
F/U Message          Michael Krueger is calling back to get the form emailed to her she is waiting to start radiation on the patient and she waiting to sign Rx, pls email back as soon as possible

## 2018-08-22 NOTE — Telephone Encounter (Signed)
  Diviya is following up on prior conversation with Amber and is checking to get the form done because she needs it done ASAP. Is there someone else she can send this form to? She needs it done within the next hour

## 2018-08-24 NOTE — Progress Notes (Signed)
Virtual Visit via Video Note   This visit type was conducted due to national recommendations for restrictions regarding the COVID-19 Pandemic (e.g. social distancing) in an effort to limit this patient's exposure and mitigate transmission in our community.  Due to his co-morbid illnesses, this patient is at least at moderate risk for complications without adequate follow up.  This format is felt to be most appropriate for this patient at this time.  All issues noted in this document were discussed and addressed.  A limited physical exam was performed with this format.  Please refer to the patient's chart for his consent to telehealth for [facility].   Evaluation Performed:  Follow-up visit  Date:  08/31/2018   ID:  Michael Krueger, DOB August 07, 1955, MRN [id number]  Patient Location: Home Provider Location: Home  PCP:  System, Pcp Not In  Cardiologist:  Kirk Ruths, MD  Electrophysiologist:  [name], MD   Chief Complaint:  FU CM and CHF  History of Present Illness:    FU CAD, ischemic cardiomyopathy, systolic HF, status post BiV-ICD, V. tach status post ablation, atrial fibrillation on amiodarone.Patient is being treated for small cell lung cancer.   Since last seen,he has some dyspnea on exertion.  He has no pedal edema, chest pain or syncope.  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  Studies/Reports Reviewed Today:  Echo3/18 -Ejection fraction 20 to 25%, severe left ventricular enlargement, moderate right ventricular enlargement, biatrial enlargement, moderate mitral regurgitation.  Myoview 08/2011 Large area of scar in the anterior, anteroseptal, inferoseptal, inferior, anterolateral and apical distributions.  No signif ischemia. LV Ejection Fraction: 14%. LV Wall Motion: Severe diffuse hypokinesis, inferior, apical akinesis.  Cardiac catheterization 10/2008 LM: Okay LAD: Stent patent with  30% ISR, mid and distal 30% LCx: Tiny marginal branch 80% proximal, proximal to this stent patent with 30% ISR, mid CFX 40% RCA: 70% after RV branch then occluded (bridging collaterals) EF 10-15%  The patient does not have symptoms concerning for COVID-19 infection (fever, chills, cough, or new shortness of breath).    Past Medical History:  Diagnosis Date  . Atrial fibrillation ([place])   . AV BLOCK, COMPLETE   . CAD   . Chronic systolic heart failure ([place])   . Gynecomastia   . HYPERLIPIDEMIA-MIXED   . HYPERTHYROIDISM   . Implantable cardiac defibrillator  medtronic    Initially related 1990s, upgrade to dual-chamber 2002, generator change 2005, generator change August 2010, hematoma evacuation September 2010.  . Ischemic cardiomyopathy   . Long term current use of anticoagulant   . OBESITY-MORBID (>100')   . TRANSAMINASES, SERUM, ELEVATED   . VENTRICULAR TACHYCARDIA    S/P RFCA x3  2006, 2010,2010   Past Surgical History:  Procedure Laterality Date  . IMPLANTABLE CARDIOVERTER DEFIBRILLATOR GENERATOR CHANGE N/A 10/23/2013   Procedure: IMPLANTABLE CARDIOVERTER DEFIBRILLATOR GENERATOR CHANGE;  Surgeon: Deboraha Sprang, MD;  Location: [facility] CATH LAB;  Service: Cardiovascular;  Laterality: N/A;  . SPLENECTOMY    . Status post Medtronic Concerto CRT-D in August 2010 with pocket revision       Current Meds  Medication Sig  . acetaminophen (TYLENOL) 500 MG tablet Take 1,000 mg by mouth daily as needed for headache.   [name] amiodarone (PACERONE) 200 MG tablet Take 1 tablet (200 mg total) by mouth daily.  [name] atorvastatin (LIPITOR) 80 MG tablet TAKE 1 TABLET (80 MG TOTAL) BY MOUTH DAILY.  . carvedilol (COREG) 3.125  MG tablet Take 1 tablet (3.125 mg total) by mouth 2 (two) times daily.  . cycloSPORINE (RESTASIS) 0.05 % ophthalmic emulsion Place 1 drop into both eyes 2 (two) times daily.  [name] ELIQUIS 5 MG TABS tablet TAKE 1 TABLET BY MOUTH TWICE A DAY  . furosemide (LASIX) 80 MG tablet Take 1 tablet  (80 mg total) by mouth daily.  [name] losartan (COZAAR) 25 MG tablet Take 1 tablet (25 mg total) by mouth daily.  . metFORMIN (GLUCOPHAGE) 850 MG tablet Take 850 mg by mouth daily.   . pantoprazole (PROTONIX) 40 MG tablet Take 1 tablet by mouth Daily.  [name] PARoxetine (PAXIL) 20 MG tablet Take 20 mg by mouth daily.  . traZODone (DESYREL) 50 MG tablet Take 50 mg by mouth daily as needed for sleep.      Allergies:   Other   Social History   Tobacco Use  . Smoking status: Former Research scientist (life sciences)  . Smokeless tobacco: Never Used  Substance Use Topics  . Alcohol use: Yes  . Drug use: No     Family Hx: The patient's family history includes Cancer in his mother; Heart attack in his maternal grandfather; Hypertension in his sister; Stroke in his paternal grandfather.  ROS:   Please see the history of present illness.    No fevers, chills or productive cough.  Nausea related to brain radiation. All other systems reviewed and are negative.  Recent Labs: 04/05/2018: ALT 23; Hemoglobin 12.6; Magnesium 2.0; Platelets 182 04/24/2018: BNP 859.0 05/29/2018: BUN 17; Creatinine, Ser 1.65; Potassium 4.0; Sodium 140; TSH 1.470   Recent Lipid Panel Lab Results  Component Value Date/Time   CHOL 124 (L) 04/20/2015 11:27 AM   TRIG 162 (H) 04/20/2015 11:27 AM   HDL 26 (L) 04/20/2015 11:27 AM   CHOLHDL 4.8 04/20/2015 11:27 AM   LDLCALC 66 04/20/2015 11:27 AM   LDLDIRECT 96.7 08/08/2011 09:50 AM    Wt Readings from Last 3 Encounters:  08/31/18 151 lb (68.5 kg)  05/23/18 151 lb (68.5 kg)  05/22/18 149 lb (67.6 kg)     Objective:    Vital Signs:  BP 113/63   Pulse 65   Wt 151 lb (68.5 kg)   BMI 27.62 kg/m    VITAL SIGNS:  reviewed  no acute distress Normal affect Answers questions appropriately Remainder physical examination not performed (telehealth visit; coronavirus pandemic)  ASSESSMENT & PLAN:    1. Chronic systolic congestive heart failure-patient apparently doing reasonably well from a volume  standpoint.  Continue present dose of Lasix with an additional 40 mg daily for increasing edema or weight gain of 2 to 3 pounds.  Patient did develop some dehydration while he was receiving chemotherapy due to decreased p.o. intake and continuation of cardiac medications including diuretics.  We discussed the importance of continuing to monitor this as he is having some nausea with his radiation therapy. 2. Ischemic cardiomyopathy-plan to continue low-dose ARB and carvedilol.  I have been unable to advance medications due to hypotension. 3. Paroxysmal atrial fibrillation-continue amiodarone.  Continue apixaban. 4. Coronary artery disease-patient is not on aspirin given need for apixaban.  Continue statin. 5. Prior ICD-managed by electrophysiology. 6. History of ventricular tachycardia-continue present dose of amiodarone. 7. Hyperlipidemia-continue statin. 8. Small cell lung cancer-managed at [facility].  COVID-19 Education: The importance of social distancing was discussed today.  Time:   Today, I have spent 10 minutes with the patient with telehealth technology discussing the above problems.     Medication Adjustments/Labs  and Tests Ordered: Current medicines are reviewed at length with the patient today.  Concerns regarding medicines are outlined above.   Tests Ordered: No orders of the defined types were placed in this encounter.   Medication Changes: No orders of the defined types were placed in this encounter.   Disposition:  Follow up in 3 month(s)  Signed, Kirk Ruths, MD  08/31/2018 2:31 PM    [facility]

## 2018-08-29 ENCOUNTER — Telehealth: Payer: Self-pay | Admitting: Cardiology

## 2018-08-31 ENCOUNTER — Encounter: Payer: Self-pay | Admitting: Cardiology

## 2018-08-31 ENCOUNTER — Telehealth (INDEPENDENT_AMBULATORY_CARE_PROVIDER_SITE_OTHER): Payer: Medicare PPO | Admitting: Cardiology

## 2018-08-31 ENCOUNTER — Ambulatory Visit: Payer: Medicare PPO | Admitting: Cardiology

## 2018-08-31 VITALS — BP 113/63 | HR 65 | Wt 151.0 lb

## 2018-08-31 DIAGNOSIS — I2510 Atherosclerotic heart disease of native coronary artery without angina pectoris: Secondary | ICD-10-CM

## 2018-08-31 DIAGNOSIS — I480 Paroxysmal atrial fibrillation: Secondary | ICD-10-CM

## 2018-08-31 DIAGNOSIS — I5022 Chronic systolic (congestive) heart failure: Secondary | ICD-10-CM

## 2018-08-31 NOTE — Patient Instructions (Signed)
Medication Instructions:  NO CHANGE If you need a refill on your cardiac medications before your next appointment, please call your pharmacy.   Lab work: If you have labs (blood work) drawn today and your tests are completely normal, you will receive your results only by: [name] MyChart Message (if you have MyChart) OR . A paper copy in the mail If you have any lab test that is abnormal or we need to change your treatment, we will call you to review the results.  Follow-Up: At [facility], you and your health needs are our priority.  As part of our continuing mission to provide you with exceptional heart care, we have created designated Provider Care Teams.  These Care Teams include your primary Cardiologist (physician) and Advanced Practice Providers (APPs -  Physician Assistants and Nurse Practitioners) who all work together to provide you with the care you need, when you need it. Your physician recommends that you schedule a follow-up appointment in: [place]

## 2018-09-10 ENCOUNTER — Other Ambulatory Visit: Payer: Self-pay | Admitting: Cardiology

## 2018-09-27 ENCOUNTER — Telehealth: Payer: Self-pay | Admitting: Nurse Practitioner

## 2018-09-27 NOTE — Telephone Encounter (Signed)
Received CareAlert for exhausted therapies in VT zone. Pt in incessant VT despite 14 spins of ATP for this episode. He has had 38 other episodes of ATP all pace terminated since 09/22/18. He just took morning meds. He states that he noticed that his heart was "beating a little funny". I asked him to check his BP - BP was 138/92.  HR was reading as 62.  He sent repeat manual transmission which showed AP/BiV pace. Advised to increase amiodarone 200mg  to bid for the next 14 days then decrease back to one tablet daily.  Virtual office visit appointment made with Dr Caryl Comes next week.   Chanetta Tamario, NP 09/27/2018 11:12 AM

## 2018-10-01 ENCOUNTER — Telehealth: Payer: Self-pay | Admitting: Internal Medicine

## 2018-10-01 ENCOUNTER — Telehealth: Payer: Self-pay | Admitting: *Deleted

## 2018-10-01 DIAGNOSIS — I4729 Other ventricular tachycardia: Secondary | ICD-10-CM

## 2018-10-01 DIAGNOSIS — I472 Ventricular tachycardia: Secondary | ICD-10-CM

## 2018-10-01 MED ORDER — CARVEDILOL 3.125 MG PO TABS: 6.2500 mg | ORAL_TABLET | Freq: Two times a day (BID) | ORAL | 11 refills | Status: DC

## 2018-10-01 NOTE — Telephone Encounter (Signed)
New Message    Pt is calling because he said he was woken up from an alarm going off with his device   Please call

## 2018-10-01 NOTE — Telephone Encounter (Signed)
    COVID-19 Pre-Screening Questions:  . In the past 7 to 10 days have you had a cough, shortness of breath, headache, congestion, fever (100 or greater) body aches, chills, sore throat, or sudden loss of taste or sense of smell? . Have you been around anyone with known Covid 19? [name] Have you been around anyone who is awaiting Covid 19 test results in the past 7 to 10 days? . Have you been around anyone who has been exposed to Covid 19, or has mentioned symptoms of Covid 19 within the past 7 to 10 days?  If you have any concerns/questions about symptoms patients report during screening (either on the phone or at threshold). Contact the provider seeing the patient or DOD for further guidance.  If neither are available contact a member of the leadership team.       Pt answered "no" to all questions. Aware of appointment tomorrow and agrees to call back if any new symptoms develop.

## 2018-10-01 NOTE — Telephone Encounter (Signed)
Noted  

## 2018-10-01 NOTE — Telephone Encounter (Signed)
Reviewed alert transmissions. Alert for LV high impedance > 3000 ohmns. Also note 11 treated VT episodes, ATP delivered. Presenting rhythm AP/BiVP with 3 beat runs of NSVT.    Spoke with pt , no sx at this time. Pt to f/u in device clinic 10/02/18 per Chanetta Shamere NP. Amio dose increased on 09/27/18 and Coreg dose increased today to 6.25 mg BID and to hold coreg if SBP < 100. Pt instructed not to drive due to [place]  driving restrictions. Pt verbalized understanding of tx plan and restrictions.

## 2018-10-02 ENCOUNTER — Ambulatory Visit (INDEPENDENT_AMBULATORY_CARE_PROVIDER_SITE_OTHER): Payer: Medicare PPO | Admitting: *Deleted

## 2018-10-02 ENCOUNTER — Other Ambulatory Visit: Payer: Self-pay

## 2018-10-02 DIAGNOSIS — I472 Ventricular tachycardia, unspecified: Secondary | ICD-10-CM

## 2018-10-02 DIAGNOSIS — I4729 Other ventricular tachycardia: Secondary | ICD-10-CM

## 2018-10-02 LAB — CUP PACEART INCLINIC DEVICE CHECK
Battery Remaining Longevity: 16 mo
Battery Voltage: 2.82 V
Brady Statistic AP VP Percent: 95.29 %
Brady Statistic AP VS Percent: 0.01 %
Brady Statistic AS VP Percent: 4.65 %
Brady Statistic AS VS Percent: 0.04 %
Brady Statistic RA Percent Paced: 94.99 %
Brady Statistic RV Percent Paced: 99.35 %
Date Time Interrogation Session: 20200602152215
HighPow Impedance: 37 Ohm
HighPow Impedance: 46 Ohm
Implantable Lead Implant Date: 20000626
Implantable Lead Implant Date: 20000816
Implantable Lead Implant Date: 20100818
Implantable Lead Implant Date: 20100818
Implantable Lead Location: 753858
Implantable Lead Location: 753859
Implantable Lead Location: 753860
Implantable Lead Location: 753860
Implantable Lead Model: 5076
Implantable Lead Model: 6940
Implantable Lead Model: 6945
Implantable Pulse Generator Implant Date: 20150624
Lead Channel Impedance Value: 1292 Ohm
Lead Channel Impedance Value: 2432 Ohm
Lead Channel Impedance Value: 304 Ohm
Lead Channel Impedance Value: 323 Ohm
Lead Channel Impedance Value: 399 Ohm
Lead Channel Impedance Value: 437 Ohm
Lead Channel Pacing Threshold Amplitude: 0.75 V
Lead Channel Pacing Threshold Amplitude: 1.25 V
Lead Channel Pacing Threshold Amplitude: 2.25 V
Lead Channel Pacing Threshold Pulse Width: 1 ms
Lead Channel Pacing Threshold Pulse Width: 1 ms
Lead Channel Pacing Threshold Pulse Width: 1 ms
Lead Channel Setting Pacing Amplitude: 2 V
Lead Channel Setting Pacing Amplitude: 2.5 V
Lead Channel Setting Pacing Amplitude: 2.5 V
Lead Channel Setting Pacing Pulse Width: 1 ms
Lead Channel Setting Pacing Pulse Width: 1 ms
Lead Channel Setting Sensing Sensitivity: 0.3 mV

## 2018-10-02 MED ORDER — CARVEDILOL 6.25 MG PO TABS: 6.2500 mg | ORAL_TABLET | Freq: Two times a day (BID) | ORAL | 3 refills | Status: DC

## 2018-10-02 MED ORDER — AMIODARONE HCL 200 MG PO TABS: 200.0000 mg | ORAL_TABLET | Freq: Two times a day (BID) | ORAL | 0 refills | Status: DC

## 2018-10-02 NOTE — Patient Instructions (Addendum)
Follow up with Dr. Caryl Comes on 10/15/18 via virtual visit. Scheduler will contact you with a time.   Send a transmission on Friday, June 12th.   Continue Amiodarone 200mg  twice daily until instructed by Dr. Caryl Comes to decrease dose.

## 2018-10-02 NOTE — Progress Notes (Signed)
ICD check in clinic. LVtip to LVring lead impedance alert 09/30/18(>3000 ohms). No capture LVtip to LVring. Vector changed to LVtip to RVcoil, impedance now measures 399 ohms, threshold 0.75V @ 1.34ms, confirmed capture via EKG. LV output fixed @ 2.0V @ 1.59ms. RV threshold slightly elevated, PW increased 1.90ms. 51 VT (average 103-115bpm) episodes treated with ATP since 09/21/18, ATP not effective for 2 episodes; both self terminated. Pt asymptomatic, amio increased 200mg  BID, carevedilol increased 6.25mg  BID Histogram distribution appropriate for patient and level of activity. Device programmed at appropriate safety margins. Estimated longevity 16 months. [name]. Virtual visit with Dr. Caryl Comes 10/15/18. Pt educated on driving restrictions per  DMV.

## 2018-10-04 ENCOUNTER — Telehealth: Payer: Medicare PPO | Admitting: Internal Medicine

## 2018-10-08 ENCOUNTER — Ambulatory Visit (INDEPENDENT_AMBULATORY_CARE_PROVIDER_SITE_OTHER): Payer: Medicare PPO | Admitting: *Deleted

## 2018-10-08 DIAGNOSIS — I472 Ventricular tachycardia: Secondary | ICD-10-CM | POA: Diagnosis not present

## 2018-10-08 DIAGNOSIS — I5022 Chronic systolic (congestive) heart failure: Secondary | ICD-10-CM

## 2018-10-08 DIAGNOSIS — I4729 Other ventricular tachycardia: Secondary | ICD-10-CM

## 2018-10-08 LAB — CUP PACEART REMOTE DEVICE CHECK
Battery Remaining Longevity: 14 mo
Battery Voltage: 2.87 V
Brady Statistic AP VP Percent: 93.89 %
Brady Statistic AP VS Percent: 0.01 %
Brady Statistic AS VP Percent: 5.89 %
Brady Statistic AS VS Percent: 0.21 %
Brady Statistic RA Percent Paced: 92.78 %
Brady Statistic RV Percent Paced: 94.98 %
Date Time Interrogation Session: 20200608041809
HighPow Impedance: 36 Ohm
HighPow Impedance: 48 Ohm
Implantable Lead Implant Date: 20000626
Implantable Lead Implant Date: 20000816
Implantable Lead Implant Date: 20100818
Implantable Lead Implant Date: 20100818
Implantable Lead Location: 753858
Implantable Lead Location: 753859
Implantable Lead Location: 753860
Implantable Lead Location: 753860
Implantable Lead Model: 5076
Implantable Lead Model: 6940
Implantable Lead Model: 6945
Implantable Pulse Generator Implant Date: 20150624
Lead Channel Impedance Value: 1235 Ohm
Lead Channel Impedance Value: 2185 Ohm
Lead Channel Impedance Value: 304 Ohm
Lead Channel Impedance Value: 304 Ohm
Lead Channel Impedance Value: 399 Ohm
Lead Channel Impedance Value: 437 Ohm
Lead Channel Pacing Threshold Amplitude: 1.625 V
Lead Channel Pacing Threshold Pulse Width: 0.4 ms
Lead Channel Sensing Intrinsic Amplitude: 1 mV
Lead Channel Sensing Intrinsic Amplitude: 1 mV
Lead Channel Setting Pacing Amplitude: 2 V
Lead Channel Setting Pacing Amplitude: 2.5 V
Lead Channel Setting Pacing Amplitude: 2.5 V
Lead Channel Setting Pacing Pulse Width: 1 ms
Lead Channel Setting Pacing Pulse Width: 1 ms
Lead Channel Setting Sensing Sensitivity: 0.3 mV

## 2018-10-12 ENCOUNTER — Ambulatory Visit (INDEPENDENT_AMBULATORY_CARE_PROVIDER_SITE_OTHER): Payer: Medicare PPO | Admitting: *Deleted

## 2018-10-12 DIAGNOSIS — Z95810 Presence of automatic (implantable) cardiac defibrillator: Secondary | ICD-10-CM

## 2018-10-12 LAB — CUP PACEART REMOTE DEVICE CHECK
Battery Remaining Longevity: 12 mo
Battery Voltage: 2.88 V
Brady Statistic AP VP Percent: 93.77 %
Brady Statistic AP VS Percent: 0.04 %
Brady Statistic AS VP Percent: 6.17 %
Brady Statistic AS VS Percent: 0.02 %
Brady Statistic RA Percent Paced: 93.26 %
Brady Statistic RV Percent Paced: 99.13 %
Date Time Interrogation Session: 20200612052403
HighPow Impedance: 38 Ohm
HighPow Impedance: 47 Ohm
Implantable Lead Implant Date: 20000626
Implantable Lead Implant Date: 20000816
Implantable Lead Implant Date: 20100818
Implantable Lead Implant Date: 20100818
Implantable Lead Location: 753858
Implantable Lead Location: 753859
Implantable Lead Location: 753860
Implantable Lead Location: 753860
Implantable Lead Model: 5076
Implantable Lead Model: 6940
Implantable Lead Model: 6945
Implantable Pulse Generator Implant Date: 20150624
Lead Channel Impedance Value: 1235 Ohm
Lead Channel Impedance Value: 2185 Ohm
Lead Channel Impedance Value: 323 Ohm
Lead Channel Impedance Value: 323 Ohm
Lead Channel Impedance Value: 399 Ohm
Lead Channel Impedance Value: 456 Ohm
Lead Channel Pacing Threshold Amplitude: 1.5 V
Lead Channel Pacing Threshold Pulse Width: 0.4 ms
Lead Channel Sensing Intrinsic Amplitude: 1.25 mV
Lead Channel Sensing Intrinsic Amplitude: 1.25 mV
Lead Channel Setting Pacing Amplitude: 2 V
Lead Channel Setting Pacing Amplitude: 2.5 V
Lead Channel Setting Pacing Amplitude: 2.5 V
Lead Channel Setting Pacing Pulse Width: 1 ms
Lead Channel Setting Pacing Pulse Width: 1 ms
Lead Channel Setting Sensing Sensitivity: 0.3 mV

## 2018-10-15 ENCOUNTER — Telehealth (INDEPENDENT_AMBULATORY_CARE_PROVIDER_SITE_OTHER): Payer: Medicare PPO | Admitting: Internal Medicine

## 2018-10-15 ENCOUNTER — Encounter: Payer: Self-pay | Admitting: Cardiology

## 2018-10-15 ENCOUNTER — Other Ambulatory Visit: Payer: Self-pay

## 2018-10-15 DIAGNOSIS — I472 Ventricular tachycardia, unspecified: Secondary | ICD-10-CM

## 2018-10-15 DIAGNOSIS — Z95810 Presence of automatic (implantable) cardiac defibrillator: Secondary | ICD-10-CM | POA: Diagnosis not present

## 2018-10-15 DIAGNOSIS — I255 Ischemic cardiomyopathy: Secondary | ICD-10-CM | POA: Diagnosis not present

## 2018-10-15 DIAGNOSIS — I5022 Chronic systolic (congestive) heart failure: Secondary | ICD-10-CM | POA: Diagnosis not present

## 2018-10-15 NOTE — Progress Notes (Signed)
Electrophysiology TeleHealth Note   Due to national recommendations of social distancing due to COVID 19, an audio/video telehealth visit is felt to be most appropriate for this patient at this time.  See MyChart message from today for the patient's consent to telehealth for [facility].   Date:  10/17/2018   ID:  Michael Krueger, DOB 1955-06-16, MRN [id number]  Location: patient's home  Provider location: [address], [place]  Evaluation Performed: Follow-up visit  PCP:  System, Pcp Not In  Cardiologist:  [facility] Electrophysiologist:  SK   Chief Complaint:  VT   History of Present Illness:    Michael Krueger is a 63 y.o. male who presents via audio/video conferencing for a telehealth visit today.  Since last being seen in our clinic for VT   the patient reports *doing pretty well   DATE TEST EF   6/15 Echo   15 %   3/18 Echo   15 % LAE severe  1/20 Echo  20-25% LAE mod/ RVE    9/19 developed recurrent episodes of ventricular tachycardia requiring frequent ATP for termination.  None since mid September.  No clear provocative issues  Date Cr K Hgb TSH LFTs Dig  12/16     2.53 20    3/18  1.23 4.9 14.2 4.27 18 0.5  9/18 1.24 3.9   2.97 23   1/19 1.34 4.5   2.61 (3/19)    12/19 1.52<<2.27 4.7<5.3<3.1 12.6  23   4/20 1.6  3.5 11.1      Interrogation 09/30/18 >> LV impedance > 3000 LVtip-ring and without capture.  Reprogrammed LVtip-RV coil with capture. Interval VT had been detected concurrent with LV failure to capture and these failed ATP, but terminated spontaneously.    Also interval incessant VT persisting despite ATP X 14; multiple other VT and amiodarone increased 200>>400 daily.   The VT began 5/22 and the lead fracture 5/30; increasing LV impedance had been noted in Jan, also there has been prolonged elevation in optivol and increase around the time of his VT storm.    NO interval VT NS sinsce aforementioned reprogramming    The patient denies chest pain,  nocturnal dyspnea, orthopnea or peripheral edema.  There have been no palpitations, lightheadedness or syncope.    Drained from brain radiation-- done at [facility]  Repeat scans scheduled to look for effectiveness of therapy    The patient denies symptoms of fevers, chills, cough, or new SOB worrisome for COVID 19.    Past Medical History:  Diagnosis Date  . Atrial fibrillation ([place])   . AV BLOCK, COMPLETE   . CAD   . Chronic systolic heart failure ([place])   . Gynecomastia   . HYPERLIPIDEMIA-MIXED   . HYPERTHYROIDISM   . Implantable cardiac defibrillator  medtronic    Initially related 1990s, upgrade to dual-chamber 2002, generator change 2005, generator change August 2010, hematoma evacuation September 2010.  . Ischemic cardiomyopathy   . Long term current use of anticoagulant   . OBESITY-MORBID (>100')   . TRANSAMINASES, SERUM, ELEVATED   . VENTRICULAR TACHYCARDIA    S/P RFCA x3  2006, 2010,2010    Past Surgical History:  Procedure Laterality Date  . IMPLANTABLE CARDIOVERTER DEFIBRILLATOR GENERATOR CHANGE N/A 10/23/2013   Procedure: IMPLANTABLE CARDIOVERTER DEFIBRILLATOR GENERATOR CHANGE;  Surgeon: Deboraha Sprang, MD;  Location: [facility] CATH LAB;  Service: Cardiovascular;  Laterality: N/A;  . SPLENECTOMY    . Status post Medtronic Concerto CRT-D  in August 2010 with pocket revision      Current Outpatient Medications  Medication Sig Dispense Refill  . acetaminophen (TYLENOL) 500 MG tablet Take 1,000 mg by mouth daily as needed for headache.     [name] amiodarone (PACERONE) 200 MG tablet Take 1 tablet (200 mg total) by mouth 2 (two) times daily. 180 tablet 0  . atorvastatin (LIPITOR) 80 MG tablet TAKE 1 TABLET (80 MG TOTAL) BY MOUTH DAILY. 90 tablet 3  . carvedilol (COREG) 6.25 MG tablet Take 1 tablet (6.25 mg total) by mouth 2 (two) times daily. 180 tablet 3  . cycloSPORINE (RESTASIS) 0.05 % ophthalmic emulsion Place 1 drop into both eyes 2 (two) times  daily.    [name] ELIQUIS 5 MG TABS tablet TAKE 1 TABLET BY MOUTH TWICE A DAY 180 tablet 1  . furosemide (LASIX) 80 MG tablet Take 1 tablet (80 mg total) by mouth daily. 30 tablet 1  . losartan (COZAAR) 25 MG tablet Take 1 tablet (25 mg total) by mouth daily. 90 tablet 3  . metFORMIN (GLUCOPHAGE) 850 MG tablet Take 850 mg by mouth daily.     . pantoprazole (PROTONIX) 40 MG tablet Take 1 tablet by mouth Daily.    [name] PARoxetine (PAXIL) 20 MG tablet Take 20 mg by mouth daily.    . traZODone (DESYREL) 50 MG tablet Take 50 mg by mouth daily as needed for sleep.      No current facility-administered medications for this visit.     Allergies:   Other   Social History:  The patient  reports that he has quit smoking. He has never used smokeless tobacco. He reports current alcohol use. He reports that he does not use drugs.   Family History:  The patient's   family history includes Cancer in his mother; Heart attack in his maternal grandfather; Hypertension in his sister; Stroke in his paternal grandfather.   ROS:  Please see the history of present illness.   All other systems are personally reviewed and negative.    Exam:    Vital Signs:  There were no vitals taken for this visit. *    Labs/Other Tests and Data Reviewed:    Recent Labs: 04/05/2018: ALT 23; Hemoglobin 12.6; Magnesium 2.0; Platelets 182 04/24/2018: BNP 859.0 05/29/2018: BUN 17; Creatinine, Ser 1.65; Potassium 4.0; Sodium 140; TSH 1.470   Wt Readings from Last 3 Encounters:  08/31/18 151 lb (68.5 kg)  05/23/18 151 lb (68.5 kg)  05/22/18 149 lb (67.6 kg)     Other studies personally reviewed: Additional studies/ records that were reviewed today include: As above   Device interrogation from 6/20 demonstrates no further interval VT---  ASSESSMENT & PLAN:    Congestive heart failure-chronic-systolic  Ventricular tachycardia--recurrent / storm  Implantable defibrillator CRT Medtronic   Ischemic cardiomyopathy  Lead  failure             Atrial  Lead elevated threshold newly identified   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   Low potassium  Without symptoms of ischemia  Tolerating the higher dose of amiodarone   Will decrease dose 400>>200 at the end of the month  His optivol has been very abnormal for months, but I do not know how it interacts with Cancer or XRT--so will not enroll in [facility] clinic  We discussed the Cancer and treatment, and will need to follow along as at some time it may be appropriate to discuss device inactivation   With his VT and low  K, not measured since 4/20, would like to recheck --perhaps at return visit to [place] 19 screen The patient denies symptoms of COVID 19 at this time.  The importance of social distancing was discussed today.  Follow-up: 23m    Current medicines are reviewed at length with the patient today.   The patient does not have concerns regarding his medicines.  The following changes were made today:   Decrease amiodarone at the end of June to 200 mg daily  Labs/ tests ordered today include:  BMET ( hopefully can be done soon at [facility])  No orders of the defined types were placed in this encounter.   Future tests ( post COVID )     Patient Risk:  after full review of this patients clinical status, I feel that they are at moderate risk at this time.  Today, I have spent 8 minutes with the patient with telehealth technology discussing the above.  Signed, [name], MD  10/17/2018 8:21 AM     [facility] [address] [place] [address] [phone] (office) [phone] (fax)

## 2018-10-15 NOTE — Progress Notes (Signed)
Remote ICD transmission.   

## 2018-10-16 NOTE — Progress Notes (Signed)
Remote ICD transmission.   

## 2018-10-23 NOTE — Progress Notes (Signed)
I was just checking to make sure you were paying attention as I had not heard from you in a few weeks :))

## 2018-10-29 NOTE — Telephone Encounter (Signed)
Opened in error

## 2018-10-30 ENCOUNTER — Telehealth: Payer: Self-pay | Admitting: Cardiology

## 2018-10-30 NOTE — Telephone Encounter (Signed)
Spoke with pt, he has lost quit a bit of weight because he has not been able to eat. He is having dizziness with standing and his bp seems to always be low. His carvedilol was decreased to 3.125 mg twice daily by dr Olin Pia nurse, otherwise all his other medications are correct. Will forward for dr Stanford Breed review

## 2018-10-30 NOTE — Telephone Encounter (Signed)
Patient called to report that yesterday he legs were wobbly and he couldn't stand up and he had to sit down, he was dizzy and lightheaded. No SOB, he checked his BP but he doesn't remember what is was, but he states it was low, he said it has been low for two weeks now.

## 2018-10-31 MED ORDER — FUROSEMIDE 80 MG PO TABS: 40.0000 mg | ORAL_TABLET | Freq: Every day | ORAL | 1 refills | Status: DC

## 2018-10-31 NOTE — Telephone Encounter (Signed)
Spoke with pt, Aware of dr crenshaw's recommendations.  °

## 2018-10-31 NOTE — Telephone Encounter (Signed)
Unable to reach pt or leave a message  

## 2018-10-31 NOTE — Telephone Encounter (Signed)
DC carvedilol and losartan.  Decrease Lasix to 40 mg daily.  Check be met.  Take additional 40 mg Lasix as needed. Kirk Ruths

## 2018-11-19 ENCOUNTER — Telehealth: Payer: Self-pay | Admitting: Cardiology

## 2018-11-19 NOTE — Telephone Encounter (Signed)
FYI

## 2018-11-19 NOTE — Telephone Encounter (Signed)
New Message    Patient states that last Thursday he went to [facility] for a CT scan and his potassium was so low that the had to give him IV. He wanted Dr. Stanford Breed to be aware.

## 2018-11-19 NOTE — Telephone Encounter (Signed)
Recheck bmet one week Kirk Ruths'

## 2018-11-19 NOTE — Telephone Encounter (Signed)
Unable to reach pt or leave a message  

## 2018-11-20 NOTE — Telephone Encounter (Signed)
Spoke with pt, he is to see his medical doctor this week for a recheck of his labs. If he does not hear from them he will let me know.

## 2018-12-20 NOTE — Progress Notes (Signed)
HPI: FU CAD, ischemic cardiomyopathy, systolic HF, status post BiV-ICD, V. tach status post ablation, atrial fibrillation on amiodarone.Patient is being treated for metastatic small cell lung cancer. Since last seen, he occasionally has some dyspnea on exertion but no orthopnea or PND.  Occasional mild pedal edema.  No exertional chest pain or syncope.  He does occasionally have some dizziness with standing.  Studies/Reports Reviewed Today:  Echo3/18 -Ejection fraction 20 to 25%, severe left ventricular enlargement, moderate right ventricular enlargement, biatrial enlargement, moderate mitral regurgitation.  Myoview 08/2011 Large area of scar in the anterior, anteroseptal, inferoseptal, inferior, anterolateral and apical distributions.  No signif ischemia. LV Ejection Fraction: 14%. LV Wall Motion: Severe diffuse hypokinesis, inferior, apical akinesis.  Cardiac catheterization 10/2008 LM: Okay LAD: Stent patent with 30% ISR, mid and distal 30% LCx: Tiny marginal branch 80% proximal, proximal to this stent patent with 30% ISR, mid CFX 40% RCA: 70% after RV branch then occluded (bridging collaterals) EF 10-15%  Current Outpatient Medications  Medication Sig Dispense Refill  . acetaminophen (TYLENOL) 500 MG tablet Take 1,000 mg by mouth daily as needed for headache.     [name] amiodarone (PACERONE) 200 MG tablet Take 1 tablet (200 mg total) by mouth 2 (two) times daily. (Patient taking differently: Take 200 mg by mouth daily. ) 180 tablet 0  . atorvastatin (LIPITOR) 80 MG tablet TAKE 1 TABLET (80 MG TOTAL) BY MOUTH DAILY. 90 tablet 3  . cycloSPORINE (RESTASIS) 0.05 % ophthalmic emulsion Place 1 drop into both eyes 2 (two) times daily.    [name] ELIQUIS 5 MG TABS tablet TAKE 1 TABLET BY MOUTH TWICE A DAY 180 tablet 1  . furosemide (LASIX) 80 MG tablet Take 0.5 tablets (40 mg total) by mouth daily. 30 tablet 1  . pantoprazole (PROTONIX) 40 MG tablet Take 1 tablet by mouth Daily.     . traZODone (DESYREL) 50 MG tablet Take 50 mg by mouth daily as needed for sleep.      No current facility-administered medications for this visit.      Past Medical History:  Diagnosis Date  . Atrial fibrillation ([place])   . AV BLOCK, COMPLETE   . CAD   . Chronic systolic heart failure ([place])   . Gynecomastia   . HYPERLIPIDEMIA-MIXED   . HYPERTHYROIDISM   . Implantable cardiac defibrillator  medtronic    Initially related 1990s, upgrade to dual-chamber 2002, generator change 2005, generator change August 2010, hematoma evacuation September 2010.  . Ischemic cardiomyopathy   . Long term current use of anticoagulant   . OBESITY-MORBID (>100')   . TRANSAMINASES, SERUM, ELEVATED   . VENTRICULAR TACHYCARDIA    S/P RFCA x3  2006, 2010,2010    Past Surgical History:  Procedure Laterality Date  . IMPLANTABLE CARDIOVERTER DEFIBRILLATOR GENERATOR CHANGE N/A 10/23/2013   Procedure: IMPLANTABLE CARDIOVERTER DEFIBRILLATOR GENERATOR CHANGE;  Surgeon: Deboraha Sprang, MD;  Location: [facility] CATH LAB;  Service: Cardiovascular;  Laterality: N/A;  . SPLENECTOMY    . Status post Medtronic Concerto CRT-D in August 2010 with pocket revision      Social History   Socioeconomic History  . Marital status: Single    Spouse name: Not on file  . Number of children: Not on file  . Years of education: Not on file  . Highest education level: Not on file  Occupational History  . Not on file  Social Needs  . Financial resource strain: Not on file  . Food insecurity  Worry: Not on file    Inability: Not on file  . Transportation needs    Medical: Not on file    Non-medical: Not on file  Tobacco Use  . Smoking status: Former Research scientist (life sciences)  . Smokeless tobacco: Never Used  Substance and Sexual Activity  . Alcohol use: Yes  . Drug use: No  . Sexual activity: Not on file  Lifestyle  . Physical activity    Days per week: Not on file    Minutes per session: Not on file  . Stress: Not on file   Relationships  . Social Herbalist on phone: Not on file    Gets together: Not on file    Attends religious service: Not on file    Active member of club or organization: Not on file    Attends meetings of clubs or organizations: Not on file    Relationship status: Not on file  . Intimate partner violence    Fear of current or ex partner: Not on file    Emotionally abused: Not on file    Physically abused: Not on file    Forced sexual activity: Not on file  Other Topics Concern  . Not on file  Social History Narrative  . Not on file    Family History  Problem Relation Age of Onset  . Cancer Mother   . Heart attack Maternal Grandfather   . Stroke Paternal Grandfather   . Hypertension Sister     ROS: no fevers or chills, productive cough, hemoptysis, dysphasia, odynophagia, melena, hematochezia, dysuria, hematuria, rash, seizure activity, orthopnea, PND, pedal edema, claudication. Remaining systems are negative.  Physical Exam: Well-developed chronically ill appearing i be met CBC liver functions TSH and I will see him in 3 months n no acute distress.  Skin is warm and dry.  HEENT is normal.  Neck is supple.  Chest is clear to auscultation with normal expansion.  Cardiovascular exam is regular rate and rhythm.  Abdominal exam nontender or distended. No masses palpated. Extremities show no edema. neuro grossly intact  A/P  1 chronic systolic congestive heart failure-patient is doing reasonably well from a symptomatic standpoint.  Continue present dose of Lasix.  Check potassium and renal function.  2 ischemic cardiomyopathy-We cannot treat with cardiac medications due to low blood pressure.  3 paroxysmal atrial fibrillation-continue amiodarone and apixaban. Check TSH, LFTs, CBC and bmet.  4 coronary artery disease-continue statin.  No aspirin given need for apixaban.  5 prior ICD-followed by electrophysiology.  6 ventricular tachycardia-continue present  dose of amiodarone.  7 hyperlipidemia-continue statin.  8 small cell lung cancer-followed by [facility].  Kirk Ruths, MD

## 2018-12-24 ENCOUNTER — Ambulatory Visit (INDEPENDENT_AMBULATORY_CARE_PROVIDER_SITE_OTHER): Payer: Medicare PPO | Admitting: Cardiology

## 2018-12-24 ENCOUNTER — Other Ambulatory Visit: Payer: Self-pay

## 2018-12-24 ENCOUNTER — Encounter: Payer: Self-pay | Admitting: Cardiology

## 2018-12-24 VITALS — BP 92/56 | HR 74 | Temp 98.6°F | Ht 62.0 in | Wt 130.0 lb

## 2018-12-24 DIAGNOSIS — I255 Ischemic cardiomyopathy: Secondary | ICD-10-CM | POA: Diagnosis not present

## 2018-12-24 DIAGNOSIS — I2510 Atherosclerotic heart disease of native coronary artery without angina pectoris: Secondary | ICD-10-CM | POA: Diagnosis not present

## 2018-12-24 DIAGNOSIS — I472 Ventricular tachycardia, unspecified: Secondary | ICD-10-CM

## 2018-12-24 DIAGNOSIS — I480 Paroxysmal atrial fibrillation: Secondary | ICD-10-CM | POA: Diagnosis not present

## 2018-12-24 NOTE — Patient Instructions (Signed)
Medication Instructions:  NO CHANGE If you need a refill on your cardiac medications before your next appointment, please call your pharmacy.   Lab work: Your physician recommends that you HAVE LAB WORK TODAY  If you have labs (blood work) drawn today and your tests are completely normal, you will receive your results only by: [name] MyChart Message (if you have MyChart) OR . A paper copy in the mail If you have any lab test that is abnormal or we need to change your treatment, we will call you to review the results.  Follow-Up: At [facility], you and your health needs are our priority.  As part of our continuing mission to provide you with exceptional heart care, we have created designated Provider Care Teams.  These Care Teams include your primary Cardiologist (physician) and Advanced Practice Providers (APPs -  Physician Assistants and Nurse Practitioners) who all work together to provide you with the care you need, when you need it. Your physician recommends that you schedule a follow-up appointment in: [place]

## 2018-12-25 ENCOUNTER — Telehealth: Payer: Self-pay | Admitting: *Deleted

## 2018-12-25 ENCOUNTER — Telehealth: Payer: Self-pay

## 2018-12-25 LAB — COMPREHENSIVE METABOLIC PANEL
ALT: 26 IU/L (ref 0–44)
AST: 30 IU/L (ref 0–40)
Albumin/Globulin Ratio: 1.4 (ref 1.2–2.2)
Albumin: 3.4 g/dL — ABNORMAL LOW (ref 3.8–4.8)
Alkaline Phosphatase: 101 IU/L (ref 39–117)
BUN/Creatinine Ratio: 9 — ABNORMAL LOW (ref 10–24)
BUN: 17 mg/dL (ref 8–27)
Bilirubin Total: 0.6 mg/dL (ref 0.0–1.2)
CO2: 24 mmol/L (ref 20–29)
Calcium: 8.4 mg/dL — ABNORMAL LOW (ref 8.6–10.2)
Chloride: 104 mmol/L (ref 96–106)
Creatinine, Ser: 1.82 mg/dL — ABNORMAL HIGH (ref 0.76–1.27)
GFR calc Af Amer: 45 mL/min/{1.73_m2} — ABNORMAL LOW (ref >59)
GFR calc non Af Amer: 39 mL/min/{1.73_m2} — ABNORMAL LOW (ref >59)
Globulin, Total: 2.5 g/dL (ref 1.5–4.5)
Glucose: 88 mg/dL (ref 65–99)
Potassium: 4.5 mmol/L (ref 3.5–5.2)
Sodium: 139 mmol/L (ref 134–144)
Total Protein: 5.9 g/dL — ABNORMAL LOW (ref 6.0–8.5)

## 2018-12-25 LAB — TSH: TSH: 2.08 u[IU]/mL (ref 0.450–4.500)

## 2018-12-25 LAB — CBC
Hematocrit: 28.5 % — ABNORMAL LOW (ref 37.5–51.0)
Hemoglobin: 10.3 g/dL — ABNORMAL LOW (ref 13.0–17.7)
MCH: 32.8 pg (ref 26.6–33.0)
MCHC: 36.1 g/dL — ABNORMAL HIGH (ref 31.5–35.7)
MCV: 91 fL (ref 79–97)
Platelets: 93 10*3/uL — CL (ref 150–450)
RBC: 3.14 x10E6/uL — ABNORMAL LOW (ref 4.14–5.80)
RDW: 17.5 % — ABNORMAL HIGH (ref 11.6–15.4)
WBC: 2.7 10*3/uL — ABNORMAL LOW (ref 3.4–10.8)

## 2018-12-25 MED ORDER — APIXABAN 2.5 MG PO TABS: 2.5000 mg | ORAL_TABLET | Freq: Two times a day (BID) | ORAL | 3 refills | Status: AC

## 2018-12-25 NOTE — Telephone Encounter (Signed)
Spoke with pt, Aware of dr Jacalyn Lefevre recommendations. He has a follow up a t [facility] 12/31/2018.

## 2018-12-25 NOTE — Telephone Encounter (Signed)
NOTES ON FILE FROM [facility] [phone] SENT REFERRAL TO SCHEDULING

## 2018-12-25 NOTE — Telephone Encounter (Signed)
-----   Message from Lelon Perla, MD sent at 12/25/2018  7:43 AM EDT ----- Change apixaban to 2.5 mg daily; fu [facility] for pancytopenia Kirk Ruths

## 2018-12-26 IMAGING — DX DG CHEST 2V
2 series · 2 of 2 positions shown · non-contrast
Comparison: 04/20/2015

CLINICAL DATA: Atrial fibrillation

EXAM:
CHEST  2 VIEW

[chest pa]
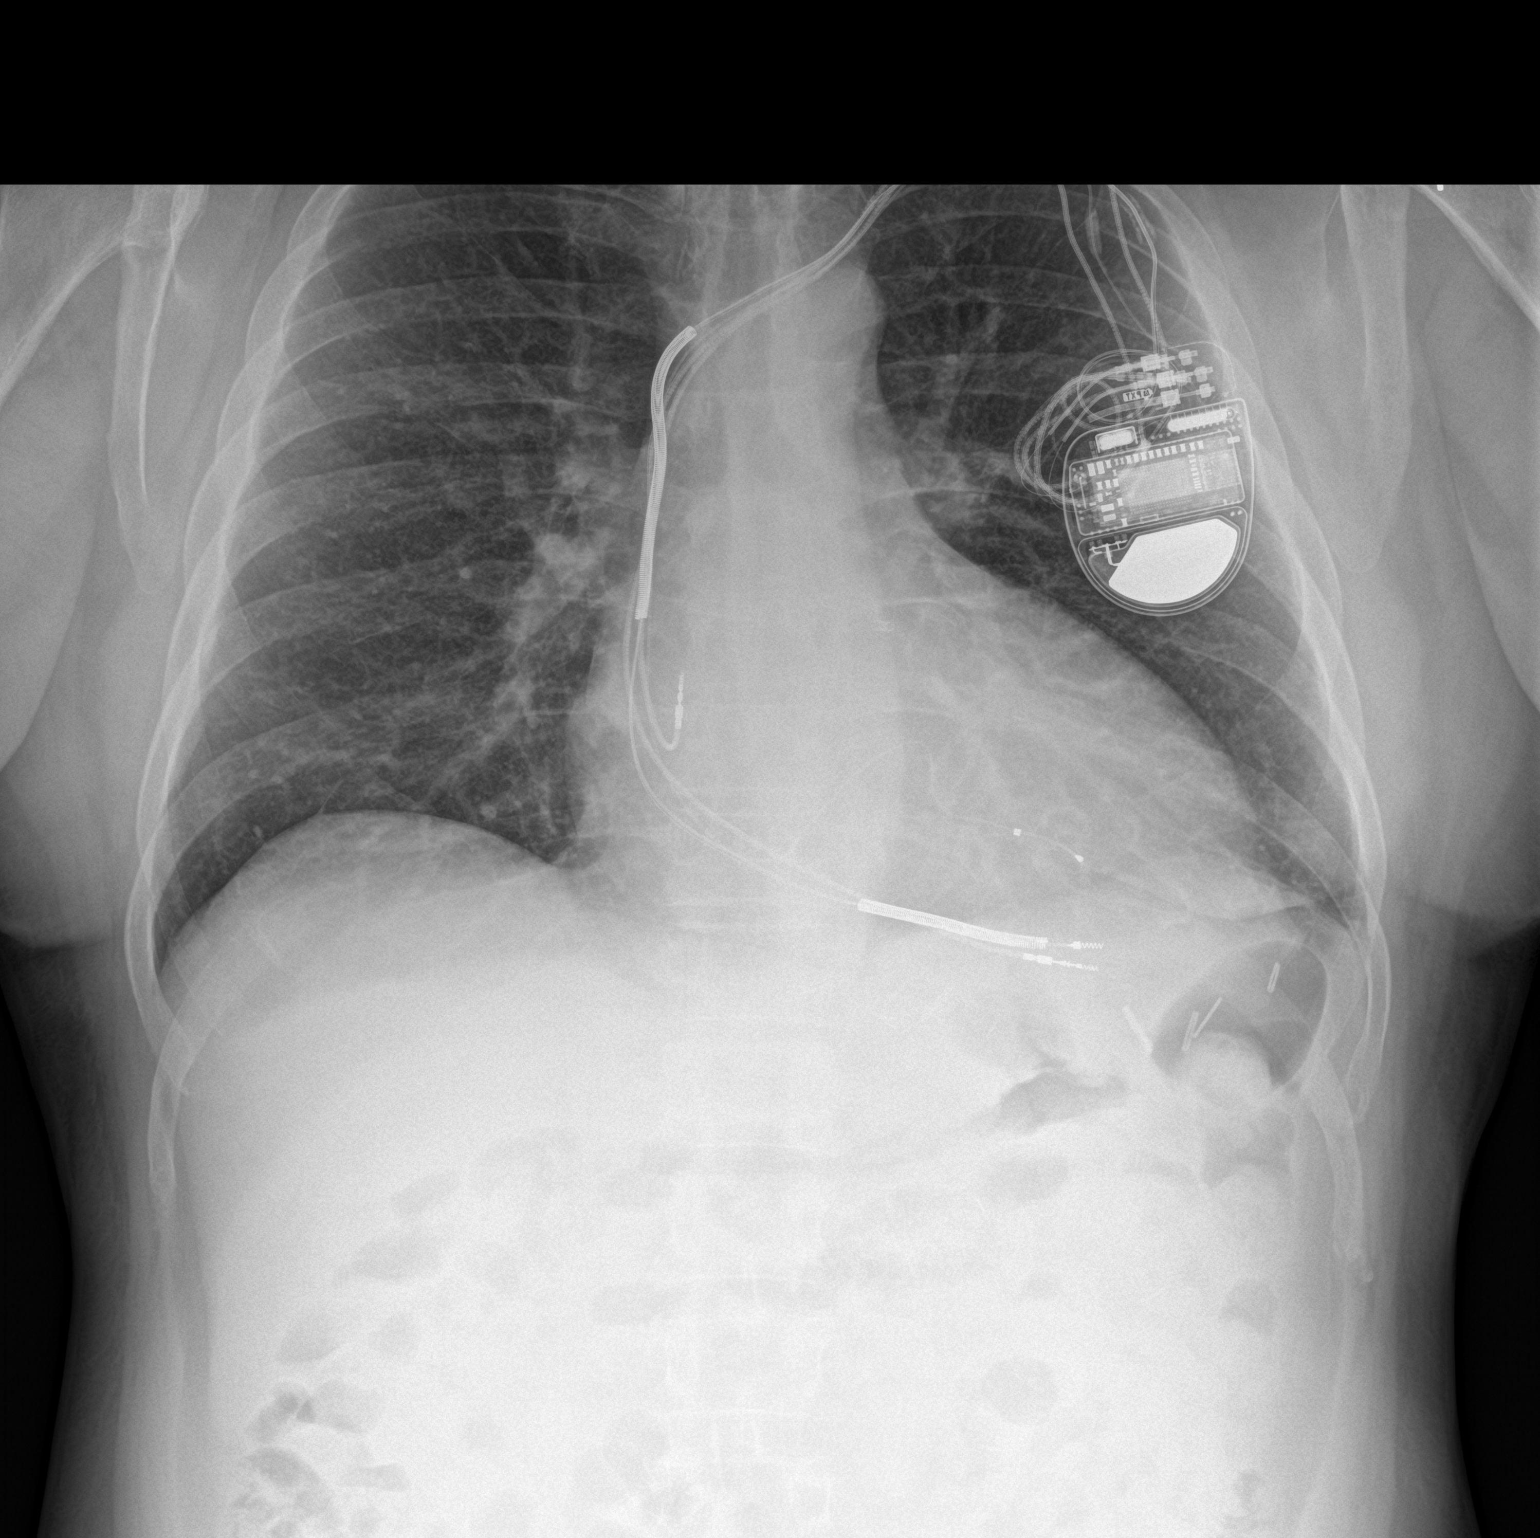

[chest lat]
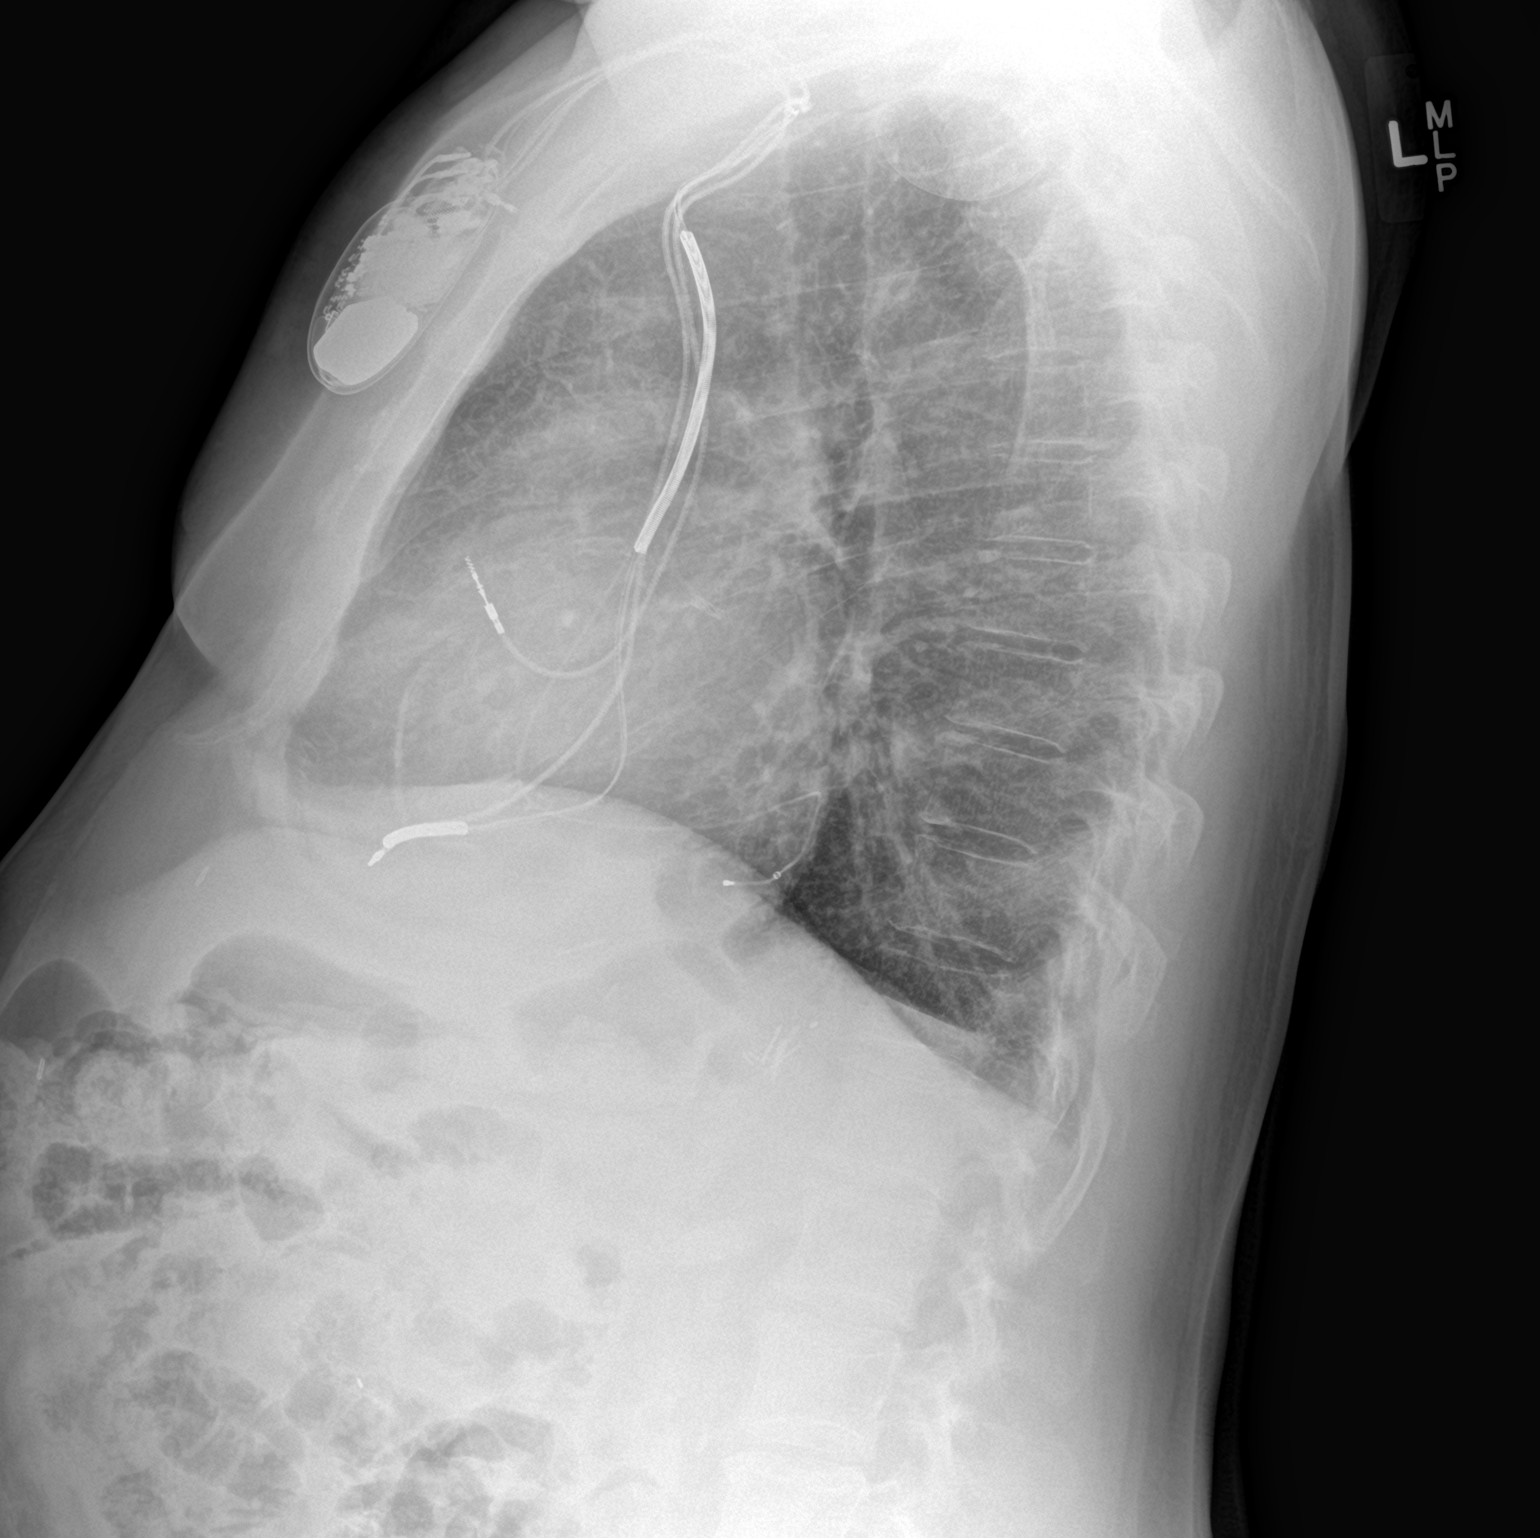

[2 of 2 positions shown; findings below may reference images not displayed]

FINDINGS: Cardiac shadow is mildly enlarged but stable. A defibrillator is
again seen and stable. No focal infiltrate or sizable effusion is
seen. No bony abnormality is noted.
IMPRESSION: No acute abnormality seen.

## 2019-01-11 ENCOUNTER — Ambulatory Visit (INDEPENDENT_AMBULATORY_CARE_PROVIDER_SITE_OTHER): Payer: Medicare PPO | Admitting: *Deleted

## 2019-01-11 DIAGNOSIS — I472 Ventricular tachycardia: Secondary | ICD-10-CM

## 2019-01-11 DIAGNOSIS — I4729 Other ventricular tachycardia: Secondary | ICD-10-CM

## 2019-01-11 DIAGNOSIS — I442 Atrioventricular block, complete: Secondary | ICD-10-CM

## 2019-01-11 LAB — CUP PACEART REMOTE DEVICE CHECK
Battery Remaining Longevity: 10 mo
Battery Voltage: 2.87 V
Brady Statistic AP VP Percent: 90.11 %
Brady Statistic AP VS Percent: 0.02 %
Brady Statistic AS VP Percent: 9.86 %
Brady Statistic AS VS Percent: 0.02 %
Brady Statistic RA Percent Paced: 88.36 %
Brady Statistic RV Percent Paced: 99.78 %
Date Time Interrogation Session: 20200911062823
HighPow Impedance: 33 Ohm
HighPow Impedance: 43 Ohm
Implantable Lead Implant Date: 20000626
Implantable Lead Implant Date: 20000816
Implantable Lead Implant Date: 20100818
Implantable Lead Implant Date: 20100818
Implantable Lead Location: 753858
Implantable Lead Location: 753859
Implantable Lead Location: 753860
Implantable Lead Location: 753860
Implantable Lead Model: 5076
Implantable Lead Model: 6940
Implantable Lead Model: 6945
Implantable Pulse Generator Implant Date: 20150624
Lead Channel Impedance Value: 1273 Ohm
Lead Channel Impedance Value: 2128 Ohm
Lead Channel Impedance Value: 304 Ohm
Lead Channel Impedance Value: 304 Ohm
Lead Channel Impedance Value: 437 Ohm
Lead Channel Impedance Value: 437 Ohm
Lead Channel Pacing Threshold Amplitude: 1.125 V
Lead Channel Pacing Threshold Pulse Width: 0.4 ms
Lead Channel Sensing Intrinsic Amplitude: 0.625 mV
Lead Channel Sensing Intrinsic Amplitude: 0.625 mV
Lead Channel Setting Pacing Amplitude: 2 V
Lead Channel Setting Pacing Amplitude: 2.5 V
Lead Channel Setting Pacing Amplitude: 2.5 V
Lead Channel Setting Pacing Pulse Width: 1 ms
Lead Channel Setting Pacing Pulse Width: 1 ms
Lead Channel Setting Sensing Sensitivity: 0.3 mV

## 2019-01-18 NOTE — Progress Notes (Signed)
Remote ICD transmission.   

## 2019-01-26 ENCOUNTER — Other Ambulatory Visit: Payer: Self-pay | Admitting: Cardiology

## 2019-02-01 ENCOUNTER — Telehealth: Payer: Self-pay | Admitting: Cardiology

## 2019-02-01 NOTE — Telephone Encounter (Signed)
Returned call to pt unloading tires at his house. He states that he is very SOB, dizzy, nauseous and tired. He states that he had CHEMO at [facility] yesterday and had lab work done there and he states that it was very good but he is anemic and his BP was low 83/53. He states that he can not take his BP and weight today but yesterday BP was 83/53 at his chemo. He states that his weight up definitely 7#. He states that he is currently taking 1/2 tab (40mg ) lasix. He will take an additional 1/2 tab and see if that helps. He will try to take his weight today and then again in the morning to see if additional lasix is productive. He will CB tomorrow with an update if the weight has not came off.  FYI I cannot see the "labwork from yesterday" in care everywhere.

## 2019-02-01 NOTE — Telephone Encounter (Signed)
Agree with plan; ER eval if symptoms worsen Kirk Ruths

## 2019-02-01 NOTE — Telephone Encounter (Signed)
°  Pt c/o swelling: STAT is pt has developed SOB within 24 hours  1) How much weight have you gained and in what time span? 7 pounds in a month  2) If swelling, where is the swelling located? Feet and legs  3) Are you currently taking a fluid pill? yes  4) Are you currently SOB? Occasional, not all the time  5) Do you have a log of your daily weights (if so, list)? no  6) Have you gained 3 pounds in a day or 5 pounds in a week?   7) Have you traveled recently? No   Patient reports that his BP during chemo was 83/53. He has a lot of swelling in his legs and he is a little dizzy. He is nauseated, but that usually comes with the chemo.  He is anemic and was concerned about his low bp during chemo. He just thought he should get in touch with his doctor after his numbers being so low yesterday.

## 2019-02-01 NOTE — Telephone Encounter (Signed)
noted 

## 2019-02-25 ENCOUNTER — Telehealth: Payer: Self-pay | Admitting: Student

## 2019-02-25 NOTE — Telephone Encounter (Signed)
Patient reported he was SOB on 02/22/19 and did feel his HR increase he did not have chemotherapy until after3 pm that afternoon. He denies CP  or syncope.  He is receiving chemotherapy IV and has nausea and vomiting after. Reports no missed doses of pacerone or Eliquis. [place] DMV driving restrictions reviewed due to therapy from device, shock plan reviewed,  and ED precautions given.

## 2019-02-25 NOTE — Telephone Encounter (Signed)
Pt returning [place] phone call. I let him speak with Jenny Reichmann, Rn.

## 2019-02-25 NOTE — Telephone Encounter (Signed)
Called patient to assess symptoms with multiple episodes of VT, including one > 1 hr with multiple failed therapies.    Pt "unavailable" at this time. Left HIPPA compliant message with his brother to please have him call us back.   3 VT episode with ATP, 47 NSVT episodes since 01/11/19. Episode #1201 lasting 1 hour 1 min with ATP x 14.   Pt current settings below. #1201 received ATP x 14 times without speeding up or slowing down.  He eventually self terminated (See last strip below)       See below for self termination of VT episode #1201 (received total of 14 ATP prior to termination)

## 2019-02-26 NOTE — Telephone Encounter (Signed)
Agree with plan Brian Crenshaw  

## 2019-02-26 NOTE — Telephone Encounter (Signed)
Pt stated he has been swelling in the legs and ankles with SOB for the past 3 weeks. He does not have his weights or a scale. Recommended getting a weight. He states he gets SOB with exertion but not at rest or lying flat. Advised pt to double lasix dose for 3 days and go back to normal dose after. Made an appt with Coletta Memos for 11/4 for f/u and to get labs checked.

## 2019-02-26 NOTE — Telephone Encounter (Signed)
° ° °  Pt c/o swelling: STAT is pt has developed SOB within 24 hours  1) How much weight have you gained and in what time span?   2) If swelling, where is the swelling located? LEGS  3) Are you currently taking a fluid pill? YES  4) Are you currently SOB? YES  5) Do you have a log of your daily weights (if so, list)?   6) Have you gained 3 pounds in a day or 5 pounds in a week?   Have you traveled recently? NO

## 2019-03-05 NOTE — Progress Notes (Signed)
Cardiology Clinic Note   Patient Name: Michael Krueger Date of Encounter: 03/06/2019  Primary Care Provider:  System, Pcp Not In Primary Cardiologist:  Kirk Ruths, MD  Patient Profile    Michael Krueger 63 year old male presents today for follow-up of his lower extremity edema, ischemic cardiomyopathy, coronary artery disease, atrial fibrillation, and ventricular tachycardia.  Past Medical History    Past Medical History:  Diagnosis Date  . Atrial fibrillation ([place])   . AV BLOCK, COMPLETE   . CAD   . Chronic systolic heart failure ([place])   . Gynecomastia   . HYPERLIPIDEMIA-MIXED   . HYPERTHYROIDISM   . Implantable cardiac defibrillator  medtronic    Initially related 1990s, upgrade to dual-chamber 2002, generator change 2005, generator change August 2010, hematoma evacuation September 2010.  . Ischemic cardiomyopathy   . Long term current use of anticoagulant   . OBESITY-MORBID (>100')   . TRANSAMINASES, SERUM, ELEVATED   . VENTRICULAR TACHYCARDIA    S/P RFCA x3  2006, 2010,2010   Past Surgical History:  Procedure Laterality Date  . IMPLANTABLE CARDIOVERTER DEFIBRILLATOR GENERATOR CHANGE N/A 10/23/2013   Procedure: IMPLANTABLE CARDIOVERTER DEFIBRILLATOR GENERATOR CHANGE;  Surgeon: Deboraha Sprang, MD;  Location: [facility] CATH LAB;  Service: Cardiovascular;  Laterality: N/A;  . SPLENECTOMY    . Status post Medtronic Concerto CRT-D in August 2010 with pocket revision      Allergies  Allergies  Allergen Reactions  . Other Itching and Other (See Comments)    CHG wipes/  Caused a rash    History of Present Illness    Michael Krueger has a past medical history of coronary artery disease, ischemic cardiomyopathy, systolic CHF, BiV-ICD, ventricular tachycardia status post ablation, atrial fibrillation on amiodarone.  He has been treated for metastatic small cell lung cancer.  He was last seen by Dr. Stanford Breed on 12/24/2018.  During that time he stated he was having some  dyspnea on exertion but no orthopnea or PND.  He occasionally indicated that he had lower extremity edema.  He denied exertional chest pain or syncope and states that he did have occasional dizziness when rising from a laying or sitting position.  On 02/25/2019 Michael Krueger called the clinic and was instructed to increase his Lasix dose for 3 days and then return to his normal Lasix dose.  He follows up today for evaluation and BMP.  He states he feels like his lower extremity swelling is back to normal.  His weight today is 129 pounds down from 151 pounds in January.  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  He states that he is having a follow-up echocardiogram with oncology tomorrow.  He states that he has been eating more processed and packaged food.  He enjoys canned ravioli however, he states his appetite has not been regular since his cancer treatment.[name]  He was educated about daily weights and low-salt diet.  Salty 6 given.  He states that he has increased dyspnea with activity but he does not notice dyspnea at rest.  He denies chest pain, increased shortness of breath, lower extremity edema, fatigue, palpitations, melena, hematuria, hemoptysis, diaphoresis, weakness, presyncope, syncope, orthopnea, and PND.   Home Medications    Prior to Admission medications   Medication Sig Start Date End Date Taking? Authorizing Provider  acetaminophen (TYLENOL) 500 MG tablet Take 1,000 mg by mouth daily as needed for headache.  [provider]  amiodarone (PACERONE) 200 MG tablet Take 1 tablet (200 mg total) by mouth 2 (two) times daily. Patient taking differently: Take 200 mg by mouth daily.  10/02/18   Deboraha Sprang, MD  apixaban (ELIQUIS) 2.5 MG TABS tablet Take 1 tablet (2.5 mg total) by mouth 2 (two) times daily. 12/25/18   Lelon Perla, MD  atorvastatin (LIPITOR) 80 MG tablet TAKE 1 TABLET (80 MG TOTAL) BY MOUTH  DAILY. 01/28/19   Lelon Perla, MD  cycloSPORINE (RESTASIS) 0.05 % ophthalmic emulsion Place 1 drop into both eyes 2 (two) times daily. 10/16/17   [provider]  furosemide (LASIX) 80 MG tablet Take 0.5 tablets (40 mg total) by mouth daily. 10/31/18 01/29/19  Lelon Perla, MD  pantoprazole (PROTONIX) 40 MG tablet Take 1 tablet by mouth Daily. 07/11/11   [provider]  traZODone (DESYREL) 50 MG tablet Take 50 mg by mouth daily as needed for sleep.  08/06/13   [provider]    Family History    Family History  Problem Relation Age of Onset  . Cancer Mother   . Heart attack Maternal Grandfather   . Stroke Paternal Grandfather   . Hypertension Sister    He indicated that his mother is alive. He indicated that his father is deceased. He indicated that the status of his sister is unknown. He indicated that his maternal grandmother is deceased. He indicated that his maternal grandfather is deceased. He indicated that his paternal grandmother is deceased. He indicated that his paternal grandfather is deceased.  Social History    Social History   Socioeconomic History  . Marital status: Single    Spouse name: Not on file  . Number of children: Not on file  . Years of education: Not on file  . Highest education level: Not on file  Occupational History  . Not on file  Social Needs  . Financial resource strain: Not on file  . Food insecurity    Worry: Not on file    Inability: Not on file  . Transportation needs    Medical: Not on file    Non-medical: Not on file  Tobacco Use  . Smoking status: Former Research scientist (life sciences)  . Smokeless tobacco: Never Used  Substance and Sexual Activity  . Alcohol use: Yes  . Drug use: No  . Sexual activity: Not on file  Lifestyle  . Physical activity    Days per week: Not on file    Minutes per session: Not on file  . Stress: Not on file  Relationships  . Social Herbalist on phone: Not on file    Gets together:  Not on file    Attends religious service: Not on file    Active member of club or organization: Not on file    Attends meetings of clubs or organizations: Not on file    Relationship status: Not on file  . Intimate partner violence    Fear of current or ex partner: Not on file    Emotionally abused: Not on file    Physically abused: Not on file    Forced sexual activity: Not on file  Other Topics Concern  . Not on file  Social History Narrative  . Not on file     Review of Systems    General:  No chills, fever, night sweats or weight changes.  Cardiovascular:  No chest pain, dyspnea on exertion, edema, orthopnea, palpitations, paroxysmal nocturnal  dyspnea. Dermatological: No rash, lesions/masses Respiratory: No cough, dyspnea Urologic: No hematuria, dysuria Abdominal:   No nausea, vomiting, diarrhea, bright red blood per rectum, melena, or hematemesis Neurologic:  No visual changes, wkns, changes in mental status. All other systems reviewed and are otherwise negative except as noted above.  Physical Exam    VS:  BP (!) 96/59 (BP Location: Left Arm, Patient Position: Sitting, Cuff Size: Normal)   Pulse 65   Ht 5\' 2"  (1.575 m)   Wt 129 lb 12.8 oz (58.9 kg)   BMI 23.74 kg/m  , BMI Body mass index is 23.74 kg/m. GEN: Well nourished, well developed, in no acute distress. HEENT: normal. Neck: Supple, no JVD, carotid bruits, or masses. Cardiac: AV sequential paced rhythm, no murmurs, rubs, or gallops. No clubbing, cyanosis, generalized nonpitting bilateral edema.  Radials/DP/PT 2+ and equal bilaterally.  Respiratory:  Respirations regular and unlabored, clear to auscultation bilaterally. GI: Soft, nontender, nondistended, BS + x 4. MS: no deformity or atrophy. Skin: warm and dry, no rash. Neuro:  Strength and sensation are intact. Psych: Normal affect.  Accessory Clinical Findings    ECG personally reviewed by me today-AV sequential or dual-chamber electronic pacemaker 65  bpm- No acute changes  EKG 05/23/2018 AV sequential or dual-chamber electronic pacemaker 71 bpm  Echocardiogram 05/29/2018 FINDINGS  Left Ventricle: The left ventricle appears to be severely increased in size, have normal wall thickness, with severely reduced systolic function of 37-10%. Echo evidence of restrictive in diastolic filling patterns. Elevated mean left atrial pressure. Right Ventricle: The right ventricle is moderately enlarged in size, has normal wall thickness and normal systolic function. Pacing wire/catheter visualized in the right atrium and Pacing wire/catheter visualized in the right ventricle.   Mitral Valve: The mitral valve normal in structure and function. Mitral valve regurgitation is moderate by color flow Doppler. Tricuspid Valve: The tricuspid valve is normal in structure. Tricuspid regurgitation was not visualized by color flow Doppler. Aortic Valve: The aortic valve tricuspid. Aortic valve regurgitation is trivial by color flow Doppler. Pulmonic Valve: The pulmonic valve is normal. Pulmonic valve regurgitation is not visualized by color flow Doppler. Venous: The inferior vena cava was normal in size with greater than 50% respiratory variablity.  Echocardiogram 06/2016 His echocardiogram showed an LVEF of 20 to 25%, severe left ventricular enlargement, moderate right ventricular enlargement, bilateral enlargement, and moderate mitral regurgitation.  Myocardial perfusion study 08/2011 Large area of scar in the anterior, anterior septal, inferior septal, inferior, anterior lateral and apical distributions were noted.  No significant ischemia.  LVEF 14%.  LV wall motion severe diffuse hypokinesis, inferior, apical and akinesis.  Cardiac catheterization 10/2008 Left anterior descending patent stent with 30% ISR, mid and distal 30% stenosis.  Left circumflex: Tiny marginal branch 80% proximal, proximal to the stent patent with 30% ISR, mid CFX 40%.  RCA: 70 percent after RV  branch then occlusion with bridging collaterals.  Ejection fraction 10 to 15%  Assessment & Plan     Chronic systolic CHF-generalized nonpitting lower extremity edema today after increasing furosemide for 3 days. follow-up echocardiogram 05/29/2018 EF 20- 25%,LA  moderately dilated,RA mildly dilated. Trivial pericardial effusion globally located around the entire heart. Continue furosemide 40 mg daily-may take an extra dose of Lasix for a.m. increase of 2 to 3 pounds overnight or 5 pounds in 1 week. Ordered BMP Continue heart healthy low-sodium diet-salty 6 given Daily weights-keep a log of weights and bring to each visit.  Ischemic cardiomyopathy-not eligible for Entresto due  to hypotension follow-up echocardiogram 05/29/2018 EF 20- 25%,left atrium  moderately dilated,Right Atrium mildly dilated. Trivial pericardial effusion globally located around the entire heart.  Paroxysmal atrial fibrillation-EKG sequential AV pacing 65 bpm Continue apixaban 2.5 mg twice daily Continue amiodarone 200 mg daily  Coronary artery disease-no chest pain today.  No ASA due to apixaban. Continue atorvastatin 80 mg tablet daily  Hyperlipidemia-LDL 66 04/20/15 Continue atorvastatin 80 mg tablet daily Heart healthy low-sodium high-fiber diet Increase physical activity as tolerated  Small cell lung cancer-management and treatment by [facility].  Disposition: Follow-up with Dr. Stanford Breed 3-4 months.  Jossie Ng. [facility] [place] [address] Office [phone] Fax [phone]

## 2019-03-06 ENCOUNTER — Encounter: Payer: Self-pay | Admitting: General Practice

## 2019-03-06 ENCOUNTER — Ambulatory Visit (INDEPENDENT_AMBULATORY_CARE_PROVIDER_SITE_OTHER): Payer: Medicare PPO | Admitting: General Practice

## 2019-03-06 ENCOUNTER — Other Ambulatory Visit: Payer: Self-pay

## 2019-03-06 VITALS — BP 96/59 | HR 65 | Ht 62.0 in | Wt 129.8 lb

## 2019-03-06 DIAGNOSIS — I255 Ischemic cardiomyopathy: Secondary | ICD-10-CM | POA: Diagnosis not present

## 2019-03-06 DIAGNOSIS — I2510 Atherosclerotic heart disease of native coronary artery without angina pectoris: Secondary | ICD-10-CM | POA: Diagnosis not present

## 2019-03-06 DIAGNOSIS — I5022 Chronic systolic (congestive) heart failure: Secondary | ICD-10-CM | POA: Diagnosis not present

## 2019-03-06 DIAGNOSIS — I480 Paroxysmal atrial fibrillation: Secondary | ICD-10-CM

## 2019-03-06 DIAGNOSIS — E7800 Pure hypercholesterolemia, unspecified: Secondary | ICD-10-CM

## 2019-03-06 DIAGNOSIS — C3490 Malignant neoplasm of unspecified part of unspecified bronchus or lung: Secondary | ICD-10-CM

## 2019-03-06 DIAGNOSIS — Z79899 Other long term (current) drug therapy: Secondary | ICD-10-CM

## 2019-03-06 LAB — BASIC METABOLIC PANEL
BUN/Creatinine Ratio: 14 (ref 10–24)
BUN: 28 mg/dL — ABNORMAL HIGH (ref 8–27)
CO2: 24 mmol/L (ref 20–29)
Calcium: 9.1 mg/dL (ref 8.6–10.2)
Chloride: 103 mmol/L (ref 96–106)
Creatinine, Ser: 1.98 mg/dL — ABNORMAL HIGH (ref 0.76–1.27)
GFR calc Af Amer: 40 mL/min/{1.73_m2} — ABNORMAL LOW (ref >59)
GFR calc non Af Amer: 35 mL/min/{1.73_m2} — ABNORMAL LOW (ref >59)
Glucose: 114 mg/dL — ABNORMAL HIGH (ref 65–99)
Potassium: 4.3 mmol/L (ref 3.5–5.2)
Sodium: 139 mmol/L (ref 134–144)

## 2019-03-06 NOTE — Patient Instructions (Addendum)
Medication Instructions:  MAY TAKE EXTRA LASIX 40MG  FOR WEIGHT GAIN   CONTINUE DAILY WEIGHTS-YOU MAY TAKE ADDITIONAL LASIX AND POTASSIUM, IF YOUR WEIGHT IS >3 POUNDS DAILY OR >5 POUNDS IN ONE WEEK. PLEASE CALL OUR OFFICE TO LET us KNOW WHEN YOU HAVE INCREASED THESE MEDICATIONS [phone].  If you need a refill on your cardiac medications before your next appointment, please call your pharmacy.  Labwork: BMET TODAY HERE IN OUR OFFICE AT [facility]    If you have labs (blood work) drawn today and your tests are completely normal, you will receive your results only by: [name] MyChart Message (if you have MyChart) OR . A paper copy in the mail If you have any lab test that is abnormal or we need to change your treatment, we will call you to review the results.  Follow-Up: IN3- 4 months  In Person You may see Kirk Ruths, MD , Coletta Memos, FNP or one of the following Advanced Practice Providers on your designated Care Team:  Kerin Ransom, PA-C  [place], [place]  Coletta Memos, [place].    At [facility], you and your health needs are our priority.  As part of our continuing mission to provide you with exceptional heart care, we have created designated Provider Care Teams.  These Care Teams include your primary Cardiologist (physician) and Advanced Practice Providers (APPs -  Physician Assistants and Nurse Practitioners) who all work together to provide you with the care you need, when you need it.  Thank you for choosing [facility] at [facility]!!      Heart Failure Action Plan A heart failure action plan helps you understand what to do when you have symptoms of heart failure. Follow the plan that was created by you and your health care provider. Review your plan each time you visit your health care provider. Red zone These signs and symptoms mean you should get medical help right away:  You have trouble breathing when resting.  You have a dry cough that is getting worse.  You have  swelling or pain in your legs or abdomen that is getting worse.  You suddenly gain more than 2-3 lb (0.9-1.4 kg) in a day, or more than 5 lb (2.3 kg) in one week. This amount may be more or less depending on your condition.  You have trouble staying awake or you feel confused.  You have chest pain.  You do not have an appetite.  You pass out. If you experience any of these symptoms:  Call your local emergency services (911 in the [place] right away or seek help at the emergency department of the nearest hospital. Yellow zone These signs and symptoms mean your condition may be getting worse and you should make some changes:  You have trouble breathing when you are active or you need to sleep with extra pillows.  You have swelling in your legs or abdomen.  You gain 2-3 lb (0.9-1.4 kg) in one day, or 5 lb (2.3 kg) in one week. This amount may be more or less depending on your condition.  You get tired easily.  You have trouble sleeping.  You have a dry cough. If you experience any of these symptoms:  Contact your health care provider within the next day.  Your health care provider may adjust your medicines. Green zone These signs mean you are doing well and can continue what you are doing:  You do not have shortness of breath.  You have very little swelling or  no new swelling.  Your weight is stable (no gain or loss).  You have a normal activity level.  You do not have chest pain or any other new symptoms. Follow these instructions at home:  Take over-the-counter and prescription medicines only as told by your health care provider. ? Weigh yourself daily. Your target weight is __130_ lb ? Call your health care provider if you gain more than _____3_____ lb in a day, or  ? more than ______5____ lb  in one week.  Eat a heart-healthy diet. Work with a diet and nutrition specialist (dietitian) to create an eating plan that is best for you.  Keep all follow-up visits as  told by your health care provider. This is important. Where to find more information  American Heart Association: www.heart.org Summary  Follow the action plan that was created by you and your health care provider.  Get help right away if you have any symptoms in the Red zone. This information is not intended to replace advice given to you by your health care provider. Make sure you discuss any questions you have with your health care provider. Document Released: 05/28/2016 Document Revised: 03/31/2017 Document Reviewed: 05/28/2016 Elsevier Patient Education  2020 [employer].

## 2019-03-13 ENCOUNTER — Telehealth: Payer: Self-pay | Admitting: Cardiology

## 2019-03-13 MED ORDER — FUROSEMIDE 40 MG PO TABS: ORAL_TABLET | ORAL | 3 refills | Status: AC

## 2019-03-13 NOTE — Telephone Encounter (Signed)
Returned call to Pt.  Pt wants to know if taking Entresto would help get the fluid off that is around his heart.  Advised Pt is unable to take Entresto d/t hypotension.  Pt does not have good understanding of what the plan is for his pericardial effusion.  Please advise what is current management plan for pericardial effusion.   (also refilled lasix as requested based on JC dictation at recent [facility])

## 2019-03-13 NOTE — Telephone Encounter (Signed)
New message    Pt c/o Shortness Of Breath: STAT if SOB developed within the last 24 hours or pt is noticeably SOB on the phone  1. Are you currently SOB (can you hear that pt is SOB on the phone)? No   2. How long have you been experiencing SOB? Per patient about a month  3. Are you SOB when sitting or when up moving around? Moving around   4. Are you currently experiencing any other symptoms? Per patient fluid around heart

## 2019-03-13 NOTE — Telephone Encounter (Signed)
Returned call to Pt.    Advised of current treatment plan for pericardial effusion (monitor, manage symptoms)  Pt thanked nurse for call.

## 2019-04-02 ENCOUNTER — Ambulatory Visit: Payer: Medicare PPO | Admitting: Cardiology

## 2019-04-12 ENCOUNTER — Ambulatory Visit (INDEPENDENT_AMBULATORY_CARE_PROVIDER_SITE_OTHER): Payer: Medicare PPO | Admitting: *Deleted

## 2019-04-12 DIAGNOSIS — I5023 Acute on chronic systolic (congestive) heart failure: Secondary | ICD-10-CM | POA: Diagnosis not present

## 2019-04-12 LAB — CUP PACEART REMOTE DEVICE CHECK
Battery Remaining Longevity: 7 mo
Battery Voltage: 2.84 V
Brady Statistic AP VP Percent: 88.94 %
Brady Statistic AP VS Percent: 0.01 %
Brady Statistic AS VP Percent: 11.02 %
Brady Statistic AS VS Percent: 0.04 %
Brady Statistic RA Percent Paced: 83.58 %
Brady Statistic RV Percent Paced: 99.65 %
Date Time Interrogation Session: 20201211012504
HighPow Impedance: 34 Ohm
HighPow Impedance: 43 Ohm
Implantable Lead Implant Date: 20000626
Implantable Lead Implant Date: 20000816
Implantable Lead Implant Date: 20100818
Implantable Lead Implant Date: 20100818
Implantable Lead Location: 753858
Implantable Lead Location: 753859
Implantable Lead Location: 753860
Implantable Lead Location: 753860
Implantable Lead Model: 5076
Implantable Lead Model: 6940
Implantable Lead Model: 6945
Implantable Pulse Generator Implant Date: 20150624
Lead Channel Impedance Value: 2508 Ohm
Lead Channel Impedance Value: 304 Ohm
Lead Channel Impedance Value: 304 Ohm
Lead Channel Impedance Value: 399 Ohm
Lead Channel Impedance Value: 399 Ohm
Lead Channel Impedance Value: 4047 Ohm
Lead Channel Pacing Threshold Amplitude: 1.625 V
Lead Channel Pacing Threshold Pulse Width: 0.4 ms
Lead Channel Sensing Intrinsic Amplitude: 0.625 mV
Lead Channel Sensing Intrinsic Amplitude: 0.625 mV
Lead Channel Setting Pacing Amplitude: 2 V
Lead Channel Setting Pacing Amplitude: 2.5 V
Lead Channel Setting Pacing Amplitude: 2.5 V
Lead Channel Setting Pacing Pulse Width: 1 ms
Lead Channel Setting Pacing Pulse Width: 1 ms
Lead Channel Setting Sensing Sensitivity: 0.3 mV

## 2019-04-29 ENCOUNTER — Telehealth: Payer: Self-pay | Admitting: Cardiology

## 2019-04-29 NOTE — Telephone Encounter (Signed)
Pharmacy please comment on Eliquis then I will contact the patient for clearance.   Kerin Ransom PA-C 04/29/2019 3:26 PM

## 2019-04-29 NOTE — Telephone Encounter (Signed)
   [facility] Pre-operative Risk Assessment    Request for surgical clearance:  1. What type of surgery is being performed? Kyphoplasty    2. When is this surgery scheduled? 05/01/19  3. What type of clearance is required (medical clearance vs. Pharmacy clearance to hold med vs. Both)? Pharmacy Clearance    Are there any medications that need to be held prior to surgery and how long?   apixaban (ELIQUIS) 2.5 MG TABS tablet   4. Practice name and name of physician performing surgery? Dr. Steffanie Dunn, [facility]   5. What is your office phone number [phone]   7.   What is your office fax number [phone]  8.   Anesthesia type (None, local, MAC, general) ? Moderate Sedation   Trilby Drummer 04/29/2019, 2:12 PM  _________________________________________________________________   (provider comments below)

## 2019-04-29 NOTE — Telephone Encounter (Signed)
   Primary Cardiologist: Kirk Ruths, MD  Chart reviewed and patient contacted by phone today as part of pre-operative protocol coverage. Given past medical history and time since last visit, based on ACC/AHA guidelines, Michael Krueger would be at acceptable risk for the planned procedure without further cardiovascular testing.   OK to hold Eliquis 3 days pre op.   I will route this recommendation to the requesting party via Epic fax function and remove from pre-op pool.  Please call with questions.  Kerin Ransom, PA-C 04/29/2019, 4:04 PM

## 2019-04-29 NOTE — Telephone Encounter (Signed)
Patient with diagnosis of atrial fibrillation on Eliquis for anticoagulation.    Procedure: Kyphoplasty   Date of procedure: 05/01/19  CHADS2-VASc score of 3 (CHF, DM2, CAD)  CrCl ~30 mL/min  Per office protocol, patient can hold Eliquis for 3 days prior to procedure.

## 2019-05-09 ENCOUNTER — Other Ambulatory Visit: Payer: Self-pay

## 2019-05-09 ENCOUNTER — Telehealth (INDEPENDENT_AMBULATORY_CARE_PROVIDER_SITE_OTHER): Payer: Medicare PPO | Admitting: Internal Medicine

## 2019-05-09 VITALS — Ht 62.0 in

## 2019-05-09 DIAGNOSIS — I472 Ventricular tachycardia: Secondary | ICD-10-CM | POA: Diagnosis not present

## 2019-05-09 DIAGNOSIS — T82110A Breakdown (mechanical) of cardiac electrode, initial encounter: Secondary | ICD-10-CM

## 2019-05-09 DIAGNOSIS — Z7901 Long term (current) use of anticoagulants: Secondary | ICD-10-CM

## 2019-05-09 DIAGNOSIS — C7931 Secondary malignant neoplasm of brain: Secondary | ICD-10-CM

## 2019-05-09 DIAGNOSIS — I5022 Chronic systolic (congestive) heart failure: Secondary | ICD-10-CM | POA: Diagnosis not present

## 2019-05-09 DIAGNOSIS — Z87891 Personal history of nicotine dependence: Secondary | ICD-10-CM

## 2019-05-09 DIAGNOSIS — I255 Ischemic cardiomyopathy: Secondary | ICD-10-CM | POA: Diagnosis not present

## 2019-05-09 DIAGNOSIS — Z95810 Presence of automatic (implantable) cardiac defibrillator: Secondary | ICD-10-CM

## 2019-05-09 DIAGNOSIS — I4729 Other ventricular tachycardia: Secondary | ICD-10-CM

## 2019-05-09 DIAGNOSIS — C3490 Malignant neoplasm of unspecified part of unspecified bronchus or lung: Secondary | ICD-10-CM

## 2019-05-09 DIAGNOSIS — R05 Cough: Secondary | ICD-10-CM

## 2019-05-09 DIAGNOSIS — Z7189 Other specified counseling: Secondary | ICD-10-CM

## 2019-05-09 NOTE — Progress Notes (Signed)
Electrophysiology TeleHealth Note   Due to national recommendations of social distancing due to COVID 19, an audio/video telehealth visit is felt to be most appropriate for this patient at this time.  See MyChart message from today for the patient's consent to telehealth for [facility].   Date:  05/09/2019   ID:  Michael Krueger, DOB Sep 19, 1955, MRN [id number]  Location: patient's home  Provider location: [address], [place]  Evaluation Performed: Follow-up visit  PCP:  System, Pcp Not In  Cardiologist:  [name] Electrophysiologist:  SK   Chief Complaint:  VT   History of Present Illness:    Michael Krueger is a 64 y.o. male who presents via audio/video conferencing for a telehealth visit today.  Since last being seen in our clinic for VT  the patient reports no further radiation but underwent back surgery.   DATE TEST EF   6/15 Echo  15 %   3/18 Echo  15 % LAE severe  1/20 Echo  20-25% LAE mod/ RVE    9/19 developed recurrent episodes of ventricular tachycardia requiring frequent ATP for termination. None since mid September. No clear provocative issues  Date Cr K Hgb TSH LFTs Dig  12/16     2.53 20   3/18  1.23 4.9 14.2 4.27 18 0.5  9/18 1.24 3.9  2.97 23   1/19 1.34 4.5  2.61 (3/19)    12/19 1.52<<2.27 4.7<5.3<3.1 12.6  23   4/20 1.6  3.5 11.1     11/20 1.98 4.3 10.6 2.08 (8/20) 17    Interrogation 09/30/18 >> LV impedance > 3000 LVtip-ring and without capture.  Reprogrammed LVtip-RV coil with capture. Interval VT had been detected concurrent with LV failure to capture and these failed ATP, but terminated spontaneously.    Also interval incessant VT persisting despite ATP X 14; multiple other VT and amiodarone increased 200>>400 daily.   The VT began 5/22 and the lead fracture 5/30; increasing LV impedance had been noted in Jan, also there has been prolonged elevation in optivol and increase around the time of his VT  storm.    Brain CA  Prior radiation   Currently complete  Interval surgery compressed disc>> surgery 10 days  Much improved  No VT tolerating amio with coughing which he attributes to cancer   Surveillance labs ok;   Cough  Shortness of breath is stable.  No chest pain with exertion.  No edema.  The patient denies symptoms of fevers, chills, cough, or new SOB worrisome for COVID 19.    Past Medical History:  Diagnosis Date  . Atrial fibrillation ([place])   . AV BLOCK, COMPLETE   . CAD   . Chronic systolic heart failure ([place])   . Gynecomastia   . HYPERLIPIDEMIA-MIXED   . HYPERTHYROIDISM   . Implantable cardiac defibrillator  medtronic    Initially related 1990s, upgrade to dual-chamber 2002, generator change 2005, generator change August 2010, hematoma evacuation September 2010.  . Ischemic cardiomyopathy   . Long term current use of anticoagulant   . OBESITY-MORBID (>100')   . TRANSAMINASES, SERUM, ELEVATED   . VENTRICULAR TACHYCARDIA    S/P RFCA x3  2006, 2010,2010    Past Surgical History:  Procedure Laterality Date  . IMPLANTABLE CARDIOVERTER DEFIBRILLATOR GENERATOR CHANGE N/A 10/23/2013   Procedure: IMPLANTABLE CARDIOVERTER DEFIBRILLATOR GENERATOR CHANGE;  Surgeon: Deboraha Sprang, MD;  Location[facility] CATH LAB;  Service: Cardiovascular;  Laterality: N/A;  . SPLENECTOMY    .  Status post Medtronic Concerto CRT-D in August 2010 with pocket revision      Current Outpatient Medications  Medication Sig Dispense Refill  . acetaminophen (TYLENOL) 500 MG tablet Take 1,000 mg by mouth daily as needed for headache.     [name] amiodarone (PACERONE) 200 MG tablet Take 1 tablet (200 mg total) by mouth 2 (two) times daily. (Patient taking differently: Take 200 mg by mouth daily. ) 180 tablet 0  . apixaban (ELIQUIS) 2.5 MG TABS tablet Take 1 tablet (2.5 mg total) by mouth 2 (two) times daily. 180 tablet 3  . atorvastatin (LIPITOR) 80 MG tablet TAKE 1 TABLET (80 MG TOTAL) BY MOUTH DAILY. 90  tablet 3  . cycloSPORINE (RESTASIS) 0.05 % ophthalmic emulsion Place 1 drop into both eyes 2 (two) times daily.    . furosemide (LASIX) 40 MG tablet Take one tablet by mouth daily.  May take an tablet for weight increase of 2 to 3 pounds overnight or 5 pounds in 1 week. 180 tablet 3  . oxycodone (OXY-IR) 5 MG capsule Take 5 mg by mouth every 3 (three) hours as needed.    . pantoprazole (PROTONIX) 40 MG tablet Take 1 tablet by mouth Daily.    . traZODone (DESYREL) 50 MG tablet Take 50 mg by mouth daily as needed for sleep.      No current facility-administered medications for this visit.    Allergies:   Other   Social History:  The patient  reports that he has quit smoking. He has never used smokeless tobacco. He reports current alcohol use. He reports that he does not use drugs.   Family History:  The patient's   family history includes Cancer in his mother; Heart attack in his maternal grandfather; Hypertension in his sister; Stroke in his paternal grandfather.   ROS:  Please see the history of present illness.   All other systems are personally reviewed and negative.    Exam:    Vital Signs:  Ht 5\' 2"  (1.575 m)   BMI 23.74 kg/m     Well appearing, alert and conversant, regular work of breathing,  good skin color Eyes- anicteric, neuro- grossly intact, skin- no apparent rash or lesions or cyanosis, mouth- oral mucosa is pink   Labs/Other Tests and Data Reviewed:    Recent Labs: 12/24/2018: ALT 26; Hemoglobin 10.3; Platelets 93; TSH 2.080 03/06/2019: BUN 28; Creatinine, Ser 1.98; Potassium 4.3; Sodium 139   Wt Readings from Last 3 Encounters:  03/06/19 129 lb 12.8 oz (58.9 kg)  12/24/18 130 lb (59 kg)  08/31/18 151 lb (68.5 kg)     Other studies personally reviewed: Additional studies/ records that were reviewed today include:As above    Device report of his ICD from 12/20 was reviewed.  No ventricular tachycardia in the 6 months preceding.  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  The importance of social distancing was discussed today.  Follow-up:  5 months OV     Current medicines are reviewed at length with the patient today.   The patient does not have concerns regarding his medicines.  The following changes were made today:  none  Labs/ tests ordered today include:   No orders of the defined types were placed in this encounter.   Future tests ( post COVID )     Patient Risk:  after full review of this patients clinical status, I feel that they are at moderate  risk at this time.  Today, I have spent 13  minutes with the patient with telehealth technology discussing the above.  Signed, [name], MD  05/09/2019 3:06 PM     [facility] [address] [place] [address] [phone] (office) [phone] (fax)

## 2019-05-18 NOTE — Progress Notes (Signed)
ICD remote 

## 2019-05-23 ENCOUNTER — Encounter: Payer: Medicare PPO | Admitting: Internal Medicine

## 2019-06-03 NOTE — Progress Notes (Signed)
HPI: FU CAD, ischemic cardiomyopathy, systolic HF, status post BiV-ICD, V. tach status post ablation, atrial fibrillation on amiodarone.Patient is being treated for metastatic small cell lung cancer. Since last seen, he has some dyspnea on exertion but denies orthopnea, PND, pedal edema, chest pain or syncope.  Studies/Reports Reviewed Today:  Echo11/20 -Ejection fraction 20, mild RV dysfunction, mild-moderate mitral regurgitation, small pericardial effusion.  Myoview 08/2011 Large area of scar in the anterior, anteroseptal, inferoseptal, inferior, anterolateral and apical distributions.  No signif ischemia. LV Ejection Fraction: 14%. LV Wall Motion: Severe diffuse hypokinesis, inferior, apical akinesis.  Cardiac catheterization 10/2008 LM: Okay LAD: Stent patent with 30% ISR, mid and distal 30% LCx: Tiny marginal branch 80% proximal, proximal to this stent patent with 30% ISR, mid CFX 40% RCA: 70% after RV branch then occluded (bridging collaterals) EF 10-15%  Current Outpatient Medications  Medication Sig Dispense Refill  . acetaminophen (TYLENOL) 500 MG tablet Take 1,000 mg by mouth daily as needed for headache.     [name] amiodarone (PACERONE) 200 MG tablet Take 1 tablet (200 mg total) by mouth 2 (two) times daily. (Patient taking differently: Take 200 mg by mouth daily. ) 180 tablet 0  . apixaban (ELIQUIS) 2.5 MG TABS tablet Take 1 tablet (2.5 mg total) by mouth 2 (two) times daily. 180 tablet 3  . atorvastatin (LIPITOR) 80 MG tablet TAKE 1 TABLET (80 MG TOTAL) BY MOUTH DAILY. 90 tablet 3  . cycloSPORINE (RESTASIS) 0.05 % ophthalmic emulsion Place 1 drop into both eyes 2 (two) times daily.    . furosemide (LASIX) 40 MG tablet Take one tablet by mouth daily.  May take an tablet for weight increase of 2 to 3 pounds overnight or 5 pounds in 1 week. 180 tablet 3  . pantoprazole (PROTONIX) 40 MG tablet Take 1 tablet by mouth Daily.    . traZODone (DESYREL) 50 MG tablet  Take 50 mg by mouth daily as needed for sleep.      No current facility-administered medications for this visit.     Past Medical History:  Diagnosis Date  . Atrial fibrillation ([facility])   . AV BLOCK, COMPLETE   . CAD   . Chronic systolic heart failure ([place])   . Gynecomastia   . HYPERLIPIDEMIA-MIXED   . HYPERTHYROIDISM   . Implantable cardiac defibrillator  medtronic    Initially related 1990s, upgrade to dual-chamber 2002, generator change 2005, generator change August 2010, hematoma evacuation September 2010.  . Ischemic cardiomyopathy   . Long term current use of anticoagulant   . OBESITY-MORBID (>100')   . TRANSAMINASES, SERUM, ELEVATED   . VENTRICULAR TACHYCARDIA    S/P RFCA x3  2006, 2010,2010    Past Surgical History:  Procedure Laterality Date  . IMPLANTABLE CARDIOVERTER DEFIBRILLATOR GENERATOR CHANGE N/A 10/23/2013   Procedure: IMPLANTABLE CARDIOVERTER DEFIBRILLATOR GENERATOR CHANGE;  Surgeon: Deboraha Sprang, MD;  Location[facility] CATH LAB;  Service: Cardiovascular;  Laterality: N/A;  . SPLENECTOMY    . Status post Medtronic Concerto CRT-D in August 2010 with pocket revision      Social History   Socioeconomic History  . Marital status: Single    Spouse name: Not on file  . Number of children: Not on file  . Years of education: Not on file  . Highest education level: Not on file  Occupational History  . Not on file  Tobacco Use  . Smoking status: Former Research scientist (life sciences)  . Smokeless tobacco: Never Used  Substance and Sexual  Activity  . Alcohol use: Yes  . Drug use: No  . Sexual activity: Not on file  Other Topics Concern  . Not on file  Social History Narrative  . Not on file   Social Determinants of Health   Financial Resource Strain:   . Difficulty of Paying Living Expenses: Not on file  Food Insecurity:   . Worried About Charity fundraiser in the Last Year: Not on file  . Ran Out of Food in the Last Year: Not on file  Transportation Needs:   . Lack of  Transportation (Medical): Not on file  . Lack of Transportation (Non-Medical): Not on file  Physical Activity:   . Days of Exercise per Week: Not on file  . Minutes of Exercise per Session: Not on file  Stress:   . Feeling of Stress : Not on file  Social Connections:   . Frequency of Communication with Friends and Family: Not on file  . Frequency of Social Gatherings with Friends and Family: Not on file  . Attends Religious Services: Not on file  . Active Member of Clubs or Organizations: Not on file  . Attends Archivist Meetings: Not on file  . Marital Status: Not on file  Intimate Partner Violence:   . Fear of Current or Ex-Partner: Not on file  . Emotionally Abused: Not on file  . Physically Abused: Not on file  . Sexually Abused: Not on file    Family History  Problem Relation Age of Onset  . Cancer Mother   . Heart attack Maternal Grandfather   . Stroke Paternal Grandfather   . Hypertension Sister     ROS: no fevers or chills, productive cough, hemoptysis, dysphasia, odynophagia, melena, hematochezia, dysuria, hematuria, rash, seizure activity, orthopnea, PND, pedal edema, claudication. Remaining systems are negative.  Physical Exam: Well-developed chronically ill appearing in no acute distress.  Skin is warm and dry.  HEENT is normal.  Neck is supple.  Chest is clear to auscultation with normal expansion.  Cardiovascular exam is regular rate and rhythm.  Abdominal exam nontender or distended. No masses palpated. Extremities show no edema. neuro grossly intact  A/P  1 chronic systolic congestive heart failure-volume status appears to be reasonable at present.  Continue Lasix at present dose.  Check potassium and renal function.  2 ischemic cardiomyopathy-patient is not on an ARB, Entresto or beta-blocker due to chronic hypotension.  3 paroxysmal atrial fibrillation-patient remains in sinus rhythm.  Continue amiodarone.  Check TSH and liver functions.   Continue apixaban.  Check hemoglobin.  4 coronary artery disease-patient denies chest pain.  Continue statin.  He is not on aspirin given need for apixaban.  5 prior ICD-Per electrophysiology.  6 ventricular tachycardia-continue amiodarone.  7 hyperlipidemia-continue statin.  8 small cell lung cancer-patient is managed at [facility].  Kirk Ruths, MD

## 2019-06-10 ENCOUNTER — Encounter: Payer: Self-pay | Admitting: Cardiology

## 2019-06-10 ENCOUNTER — Other Ambulatory Visit: Payer: Self-pay

## 2019-06-10 ENCOUNTER — Ambulatory Visit (INDEPENDENT_AMBULATORY_CARE_PROVIDER_SITE_OTHER): Payer: Medicare PPO | Admitting: Cardiology

## 2019-06-10 VITALS — BP 92/62 | HR 77 | Ht 62.0 in | Wt 127.0 lb

## 2019-06-10 DIAGNOSIS — Z79899 Other long term (current) drug therapy: Secondary | ICD-10-CM

## 2019-06-10 DIAGNOSIS — I5022 Chronic systolic (congestive) heart failure: Secondary | ICD-10-CM

## 2019-06-10 DIAGNOSIS — I255 Ischemic cardiomyopathy: Secondary | ICD-10-CM

## 2019-06-10 DIAGNOSIS — I2510 Atherosclerotic heart disease of native coronary artery without angina pectoris: Secondary | ICD-10-CM

## 2019-06-10 DIAGNOSIS — I480 Paroxysmal atrial fibrillation: Secondary | ICD-10-CM | POA: Diagnosis not present

## 2019-06-10 NOTE — Patient Instructions (Signed)
Medication Instructions:  NO CHANGE *If you need a refill on your cardiac medications before your next appointment, please call your pharmacy*  Lab Work: Your physician recommends that you HAVE LAB WORK TODAY If you have labs (blood work) drawn today and your tests are completely normal, you will receive your results only by: [name] MyChart Message (if you have MyChart) OR . A paper copy in the mail If you have any lab test that is abnormal or we need to change your treatment, we will call you to review the results.  Follow-Up: At [facility], you and your health needs are our priority.  As part of our continuing mission to provide you with exceptional heart care, we have created designated Provider Care Teams.  These Care Teams include your primary Cardiologist (physician) and Advanced Practice Providers (APPs -  Physician Assistants and Nurse Practitioners) who all work together to provide you with the care you need, when you need it.  Your next appointment:   6 month(s)  The format for your next appointment:   Either In Person or Virtual  Provider:   You may see Kirk Ruths, MD or one of the following Advanced Practice Providers on your designated Care Team:    Kerin Ransom, PA-C  [place], [place]  Coletta Memos, [place]

## 2019-06-11 ENCOUNTER — Telehealth: Payer: Self-pay | Admitting: *Deleted

## 2019-06-11 DIAGNOSIS — R7989 Other specified abnormal findings of blood chemistry: Secondary | ICD-10-CM

## 2019-06-11 MED ORDER — LEVOTHYROXINE SODIUM 25 MCG PO TABS: 25.0000 ug | ORAL_TABLET | Freq: Every day | ORAL | 3 refills | Status: AC

## 2019-06-11 NOTE — Telephone Encounter (Addendum)
Spoke with pt, Aware of dr Jacalyn Lefevre recommendations. New script sent to the pharmacy and Lab orders mailed to the pt   ----- Message from Lelon Perla, MD sent at 06/11/2019  7:20 AM EST ----- Add synthroid 25 micrograms daily; TSH and free T4 12 weeks Kirk Ruths

## 2019-06-13 LAB — COMPREHENSIVE METABOLIC PANEL
ALT: 13 IU/L (ref 0–44)
AST: 24 IU/L (ref 0–40)
Albumin/Globulin Ratio: 1.1 — ABNORMAL LOW (ref 1.2–2.2)
Albumin: 3.7 g/dL — ABNORMAL LOW (ref 3.8–4.8)
Alkaline Phosphatase: 125 IU/L — ABNORMAL HIGH (ref 39–117)
BUN/Creatinine Ratio: 6 — ABNORMAL LOW (ref 10–24)
BUN: 9 mg/dL (ref 8–27)
Bilirubin Total: 0.4 mg/dL (ref 0.0–1.2)
CO2: 24 mmol/L (ref 20–29)
Calcium: 9 mg/dL (ref 8.6–10.2)
Chloride: 102 mmol/L (ref 96–106)
Creatinine, Ser: 1.46 mg/dL — ABNORMAL HIGH (ref 0.76–1.27)
GFR calc Af Amer: 58 mL/min/{1.73_m2} — ABNORMAL LOW (ref >59)
GFR calc non Af Amer: 50 mL/min/{1.73_m2} — ABNORMAL LOW (ref >59)
Globulin, Total: 3.3 g/dL (ref 1.5–4.5)
Glucose: 85 mg/dL (ref 65–99)
Potassium: 3.9 mmol/L (ref 3.5–5.2)
Sodium: 142 mmol/L (ref 134–144)
Total Protein: 7 g/dL (ref 6.0–8.5)

## 2019-06-13 LAB — TSH: TSH: 11.7 u[IU]/mL — ABNORMAL HIGH (ref 0.450–4.500)

## 2019-06-13 LAB — CBC
Hematocrit: 35.6 % — ABNORMAL LOW (ref 37.5–51.0)
Hemoglobin: 12.4 g/dL — ABNORMAL LOW (ref 13.0–17.7)
MCH: 33.7 pg — ABNORMAL HIGH (ref 26.6–33.0)
MCHC: 34.8 g/dL (ref 31.5–35.7)
MCV: 97 fL (ref 79–97)
Platelets: 188 10*3/uL (ref 150–450)
RBC: 3.68 x10E6/uL — ABNORMAL LOW (ref 4.14–5.80)
RDW: 15 % (ref 11.6–15.4)
WBC: 6.2 10*3/uL (ref 3.4–10.8)

## 2019-06-17 ENCOUNTER — Other Ambulatory Visit: Payer: Self-pay | Admitting: Internal Medicine

## 2019-06-17 DIAGNOSIS — I4729 Other ventricular tachycardia: Secondary | ICD-10-CM

## 2019-06-17 DIAGNOSIS — I472 Ventricular tachycardia: Secondary | ICD-10-CM

## 2019-06-17 MED ORDER — AMIODARONE HCL 200 MG PO TABS: 200.0000 mg | ORAL_TABLET | Freq: Two times a day (BID) | ORAL | 3 refills | Status: AC

## 2019-06-17 NOTE — Telephone Encounter (Signed)
Pt's medication was sent to pt's pharmacy as requested. Confirmation received.  °

## 2019-06-18 ENCOUNTER — Telehealth: Payer: Self-pay | Admitting: *Deleted

## 2019-06-18 DIAGNOSIS — I3139 Other pericardial effusion (noninflammatory): Secondary | ICD-10-CM

## 2019-06-18 DIAGNOSIS — I313 Pericardial effusion (noninflammatory): Secondary | ICD-10-CM

## 2019-06-18 NOTE — Telephone Encounter (Signed)
Dr Stanford Breed reviewed records from [place], [place] scan from 06/13/19 shows moderate pericardial effusion. Patient needs to be schedule for an echocardiogram. Left message for pt to call

## 2019-06-25 NOTE — Telephone Encounter (Signed)
Unable to reach pt or leave a message no voicemail set up

## 2019-06-26 NOTE — Telephone Encounter (Signed)
Follow up  ° ° ° ° °Pt is returning your call  °

## 2019-06-26 NOTE — Telephone Encounter (Signed)
Spoke with pt, Aware of dr Jacalyn Lefevre recommendations. Order placed for [place] for echo.

## 2019-06-26 NOTE — Telephone Encounter (Signed)
Left message for pt to call.

## 2019-06-27 ENCOUNTER — Telehealth: Payer: Self-pay | Admitting: Cardiology

## 2019-06-27 NOTE — Telephone Encounter (Signed)
Spoke with Michael Krueger this morning in regards to scheduling his echo. He states that he is on the way to [facility] to start chemo treatments today. He stated that he will contact our office as soon as he finds out what the plan will be.

## 2019-07-03 ENCOUNTER — Other Ambulatory Visit (HOSPITAL_COMMUNITY): Payer: Medicare PPO

## 2019-07-12 ENCOUNTER — Ambulatory Visit (INDEPENDENT_AMBULATORY_CARE_PROVIDER_SITE_OTHER): Payer: Medicare PPO | Admitting: *Deleted

## 2019-07-12 DIAGNOSIS — I5023 Acute on chronic systolic (congestive) heart failure: Secondary | ICD-10-CM | POA: Diagnosis not present

## 2019-07-12 LAB — CUP PACEART REMOTE DEVICE CHECK
Battery Remaining Longevity: 5 mo
Battery Voltage: 2.81 V
Brady Statistic AP VP Percent: 86.09 %
Brady Statistic AP VS Percent: 0.01 %
Brady Statistic AS VP Percent: 13.89 %
Brady Statistic AS VS Percent: 0.02 %
Brady Statistic RA Percent Paced: 78.66 %
Brady Statistic RV Percent Paced: 99.81 %
Date Time Interrogation Session: 20210312012503
HighPow Impedance: 35 Ohm
HighPow Impedance: 44 Ohm
Implantable Lead Implant Date: 20000626
Implantable Lead Implant Date: 20000816
Implantable Lead Implant Date: 20100818
Implantable Lead Implant Date: 20100818
Implantable Lead Location: 753858
Implantable Lead Location: 753859
Implantable Lead Location: 753860
Implantable Lead Location: 753860
Implantable Lead Model: 5076
Implantable Lead Model: 6940
Implantable Lead Model: 6945
Implantable Pulse Generator Implant Date: 20150624
Lead Channel Impedance Value: 304 Ohm
Lead Channel Impedance Value: 304 Ohm
Lead Channel Impedance Value: 3838 Ohm
Lead Channel Impedance Value: 4047 Ohm
Lead Channel Impedance Value: 456 Ohm
Lead Channel Impedance Value: 494 Ohm
Lead Channel Pacing Threshold Amplitude: 1.375 V
Lead Channel Pacing Threshold Pulse Width: 0.4 ms
Lead Channel Sensing Intrinsic Amplitude: 0.75 mV
Lead Channel Sensing Intrinsic Amplitude: 0.75 mV
Lead Channel Setting Pacing Amplitude: 2 V
Lead Channel Setting Pacing Amplitude: 2.5 V
Lead Channel Setting Pacing Amplitude: 2.5 V
Lead Channel Setting Pacing Pulse Width: 1 ms
Lead Channel Setting Pacing Pulse Width: 1 ms
Lead Channel Setting Sensing Sensitivity: 0.3 mV

## 2019-07-12 NOTE — Progress Notes (Signed)
ICD Remote  

## 2019-07-13 IMAGING — DX DG CHEST 2V
2 series · 2 of 2 positions shown · non-contrast
Comparison: 07/01/2016, 08/21/2012

CLINICAL DATA: Amiodarone surveillance, no chest pain or shortness
of breath. Slight cough. No congestion.

EXAM:
CHEST  2 VIEW

[chest pa]
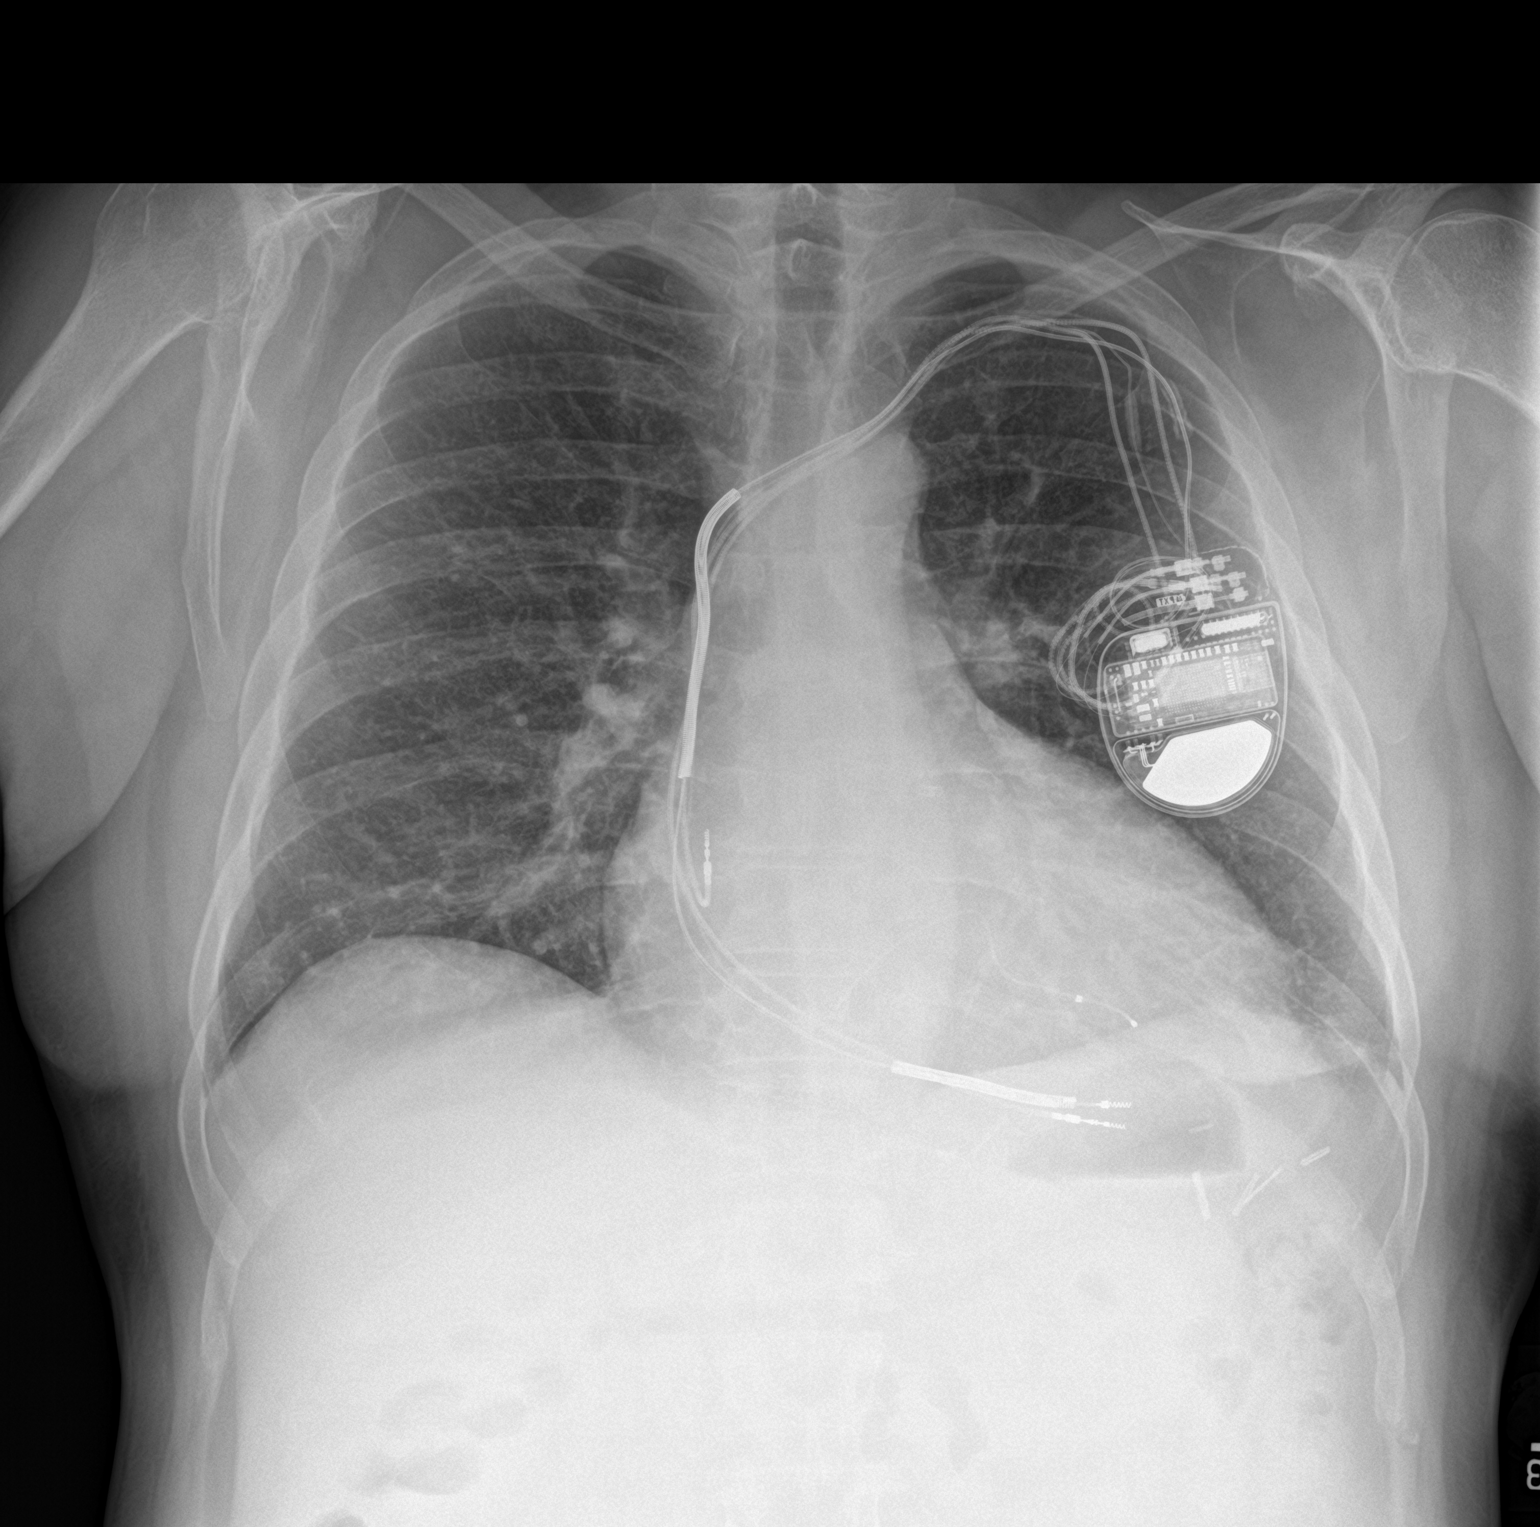

[chest lat]
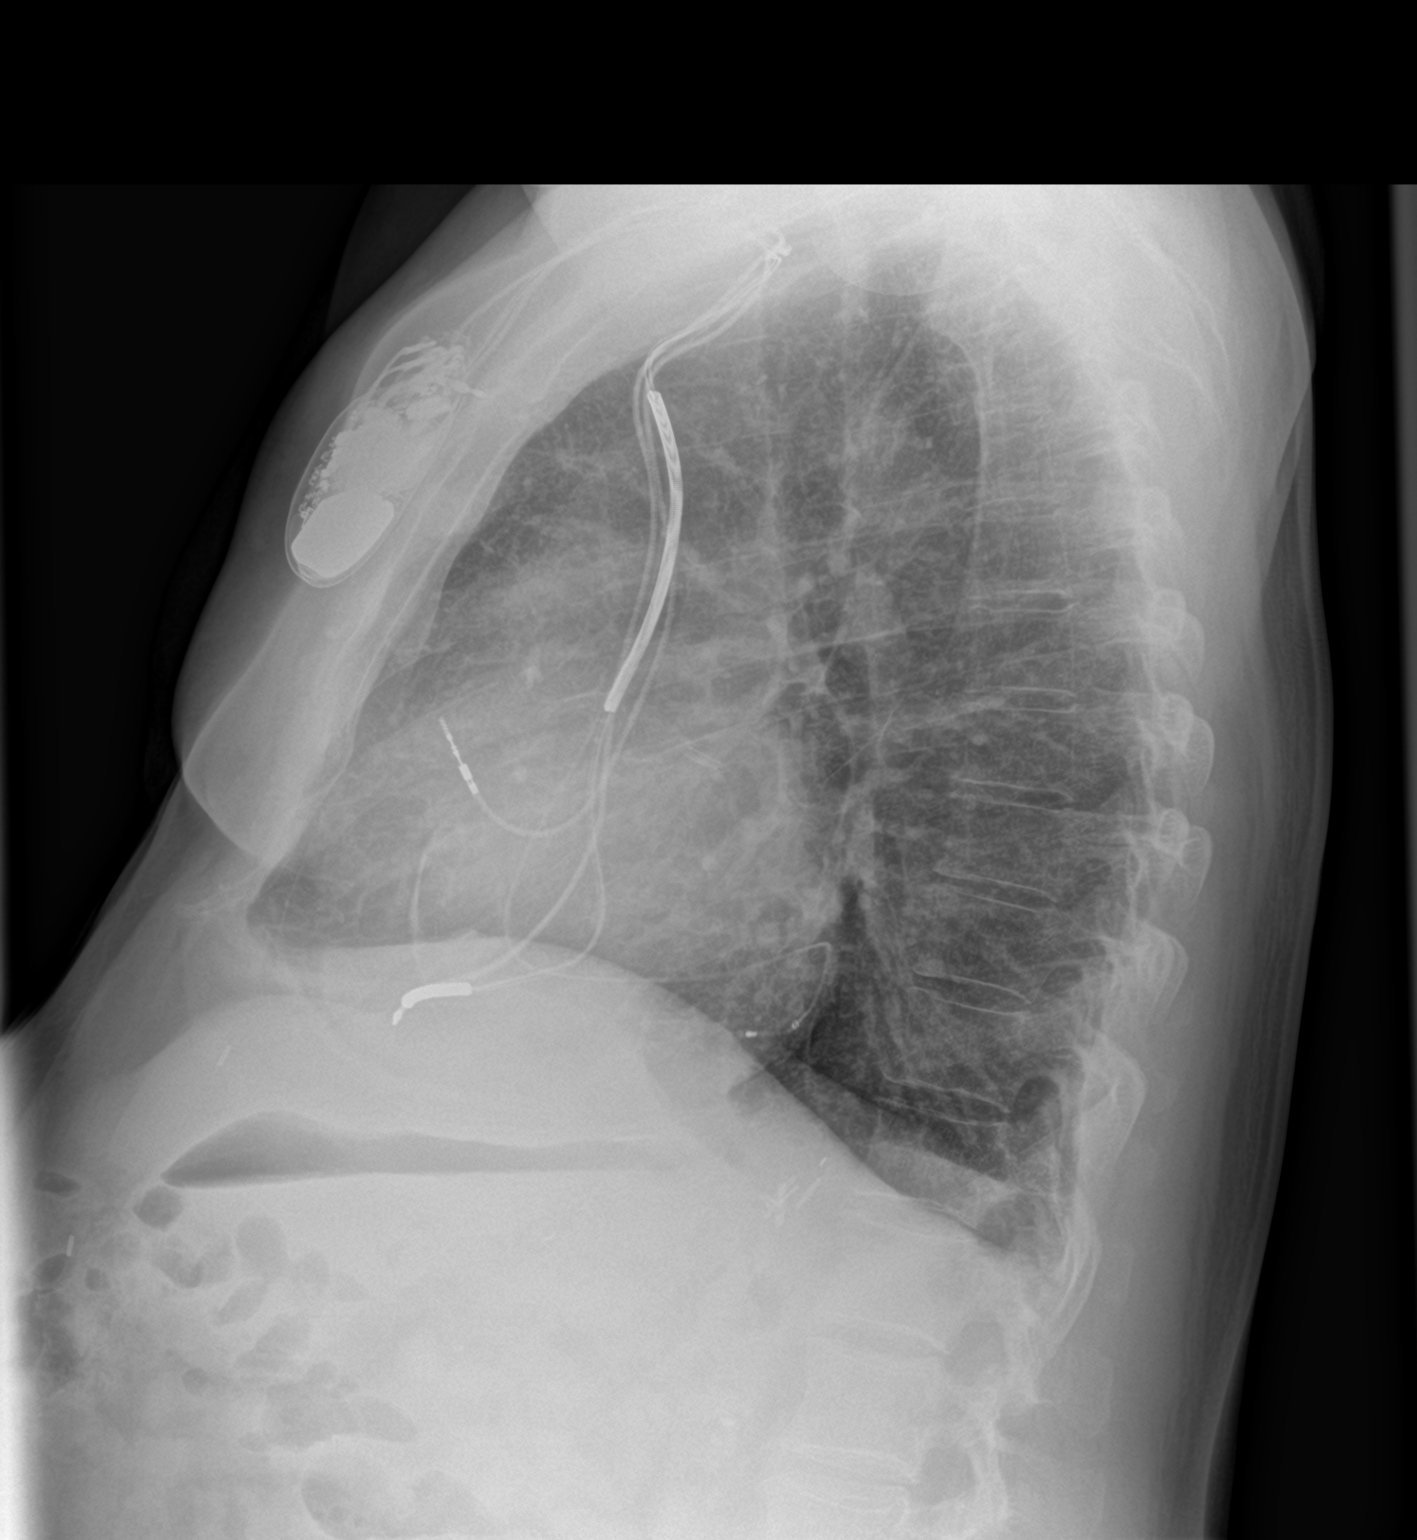

[2 of 2 positions shown; findings below may reference images not displayed]

FINDINGS: There is no focal parenchymal opacity. There is no pleural effusion
or pneumothorax. The heart and mediastinal contours are
unremarkable. There is a 4 lead cardiac pacemaker present.

The osseous structures are unremarkable.
IMPRESSION: No active cardiopulmonary disease.

## 2019-07-18 ENCOUNTER — Other Ambulatory Visit: Payer: Self-pay

## 2019-07-18 ENCOUNTER — Ambulatory Visit (HOSPITAL_COMMUNITY): Payer: Medicare PPO | Attending: Cardiovascular Disease

## 2019-07-18 DIAGNOSIS — I313 Pericardial effusion (noninflammatory): Secondary | ICD-10-CM | POA: Insufficient documentation

## 2019-07-18 DIAGNOSIS — I3139 Other pericardial effusion (noninflammatory): Secondary | ICD-10-CM

## 2019-07-22 ENCOUNTER — Other Ambulatory Visit: Payer: Self-pay

## 2019-07-22 ENCOUNTER — Encounter: Payer: Self-pay | Admitting: Cardiology

## 2019-07-22 ENCOUNTER — Ambulatory Visit (INDEPENDENT_AMBULATORY_CARE_PROVIDER_SITE_OTHER): Payer: Medicare PPO | Admitting: Cardiology

## 2019-07-22 VITALS — BP 102/62 | HR 78 | Ht 62.0 in | Wt 136.8 lb

## 2019-07-22 DIAGNOSIS — I255 Ischemic cardiomyopathy: Secondary | ICD-10-CM | POA: Diagnosis not present

## 2019-07-22 DIAGNOSIS — I480 Paroxysmal atrial fibrillation: Secondary | ICD-10-CM

## 2019-07-22 DIAGNOSIS — I313 Pericardial effusion (noninflammatory): Secondary | ICD-10-CM

## 2019-07-22 DIAGNOSIS — I2510 Atherosclerotic heart disease of native coronary artery without angina pectoris: Secondary | ICD-10-CM

## 2019-07-22 DIAGNOSIS — I5022 Chronic systolic (congestive) heart failure: Secondary | ICD-10-CM | POA: Diagnosis not present

## 2019-07-22 DIAGNOSIS — I3139 Other pericardial effusion (noninflammatory): Secondary | ICD-10-CM

## 2019-07-22 NOTE — Patient Instructions (Signed)
Medication Instructions:  NO CHANGE *If you need a refill on your cardiac medications before your next appointment, please call your pharmacy*   Lab Work: If you have labs (blood work) drawn today and your tests are completely normal, you will receive your results only by: [name] MyChart Message (if you have MyChart) OR . A paper copy in the mail If you have any lab test that is abnormal or we need to change your treatment, we will call you to review the results.   Testing/Procedures: Your physician has requested that you have an echocardiogram. Echocardiography is a painless test that uses sound waves to create images of your heart. It provides your doctor with information about the size and shape of your heart and how well your heart's chambers and valves are working. This procedure takes approximately one hour. There are no restrictions for this procedure.[place] 08-01-19    Follow-Up: At [facility], you and your health needs are our priority.  As part of our continuing mission to provide you with exceptional heart care, we have created designated Provider Care Teams.  These Care Teams include your primary Cardiologist (physician) and Advanced Practice Providers (APPs -  Physician Assistants and Nurse Practitioners) who all work together to provide you with the care you need, when you need it.  We recommend signing up for the patient portal called "MyChart".  Sign up information is provided on this After Visit Summary.  MyChart is used to connect with patients for Virtual Visits (Telemedicine).  Patients are able to view lab/test results, encounter notes, upcoming appointments, etc.  Non-urgent messages can be sent to your provider as well.   To learn more about what you can do with MyChart, go to [email or web address].    Your next appointment:   4 week(s)  The format for your next appointment:   In Person  Provider:   Kirk Ruths, MD

## 2019-07-22 NOTE — Progress Notes (Signed)
HPI: FU CAD, ischemic cardiomyopathy, systolic HF, status post BiV-ICD, V. tach status post ablation, atrial fibrillation on amiodarone.Patient is being treated formetastaticsmall cell lung cancer. Since last seen,  he has some fatigue and dyspnea on exertion slightly worse compared to previous.  No orthopnea, PND, pedal edema, chest pain, syncope.  Studies/Reports Reviewed Today:  Echo3/18/21 -Severe LV dysfunction, severe left ventricular enlargement, grade 2 diastolic dysfunction, mild RV dysfunction, severe left atrial enlargement, large pericardial effusion, mild mitral regurgitation and trace aortic insufficiency.  I reviewed and felt the pericardial fusion was likely moderate in size.  Also note pericardial effusion was described as small by echocardiogram November 2020 at [facility].  Chest CT 2/21 -New nodule right lower lobe, moderate pericardial effusion.  Myoview 08/2011 Large area of scar in the anterior, anteroseptal, inferoseptal, inferior, anterolateral and apical distributions.  No signif ischemia. LV Ejection Fraction: 14%. LV Wall Motion: Severe diffuse hypokinesis, inferior, apical akinesis.  Cardiac catheterization 10/2008 LM: Okay LAD: Stent patent with 30% ISR, mid and distal 30% LCx: Tiny marginal branch 80% proximal, proximal to this stent patent with 30% ISR, mid CFX 40% RCA: 70% after RV branch then occluded (bridging collaterals) EF 10-15%  Current Outpatient Medications  Medication Sig Dispense Refill  . acetaminophen (TYLENOL) 500 MG tablet Take 1,000 mg by mouth daily as needed for headache.     [name] amiodarone (PACERONE) 200 MG tablet Take 1 tablet (200 mg total) by mouth 2 (two) times daily. 180 tablet 3  . apixaban (ELIQUIS) 2.5 MG TABS tablet Take 1 tablet (2.5 mg total) by mouth 2 (two) times daily. 180 tablet 3  . atorvastatin (LIPITOR) 80 MG tablet TAKE 1 TABLET (80 MG TOTAL) BY MOUTH DAILY. 90 tablet 3  . cycloSPORINE  (RESTASIS) 0.05 % ophthalmic emulsion Place 1 drop into both eyes 2 (two) times daily.    . furosemide (LASIX) 40 MG tablet Take one tablet by mouth daily.  May take an tablet for weight increase of 2 to 3 pounds overnight or 5 pounds in 1 week. 180 tablet 3  . levothyroxine (SYNTHROID) 25 MCG tablet Take 1 tablet (25 mcg total) by mouth daily before breakfast. 90 tablet 3  . pantoprazole (PROTONIX) 40 MG tablet Take 1 tablet by mouth Daily.    . traZODone (DESYREL) 50 MG tablet Take 50 mg by mouth daily as needed for sleep.      No current facility-administered medications for this visit.     Past Medical History:  Diagnosis Date  . Atrial fibrillation ([place])   . AV BLOCK, COMPLETE   . CAD   . Chronic systolic heart failure ([place])   . Gynecomastia   . HYPERLIPIDEMIA-MIXED   . HYPERTHYROIDISM   . Implantable cardiac defibrillator  medtronic    Initially related 1990s, upgrade to dual-chamber 2002, generator change 2005, generator change August 2010, hematoma evacuation September 2010.  . Ischemic cardiomyopathy   . Long term current use of anticoagulant   . OBESITY-MORBID (>100')   . TRANSAMINASES, SERUM, ELEVATED   . VENTRICULAR TACHYCARDIA    S/P RFCA x3  2006, 2010,2010    Past Surgical History:  Procedure Laterality Date  . IMPLANTABLE CARDIOVERTER DEFIBRILLATOR GENERATOR CHANGE N/A 10/23/2013   Procedure: IMPLANTABLE CARDIOVERTER DEFIBRILLATOR GENERATOR CHANGE;  Surgeon: Deboraha Sprang, MD;  Location: [facility] CATH LAB;  Service: Cardiovascular;  Laterality: N/A;  . SPLENECTOMY    . Status post Medtronic Concerto CRT-D in August 2010 with pocket revision  Social History   Socioeconomic History  . Marital status: Single    Spouse name: Not on file  . Number of children: Not on file  . Years of education: Not on file  . Highest education level: Not on file  Occupational History  . Not on file  Tobacco Use  . Smoking status: Former Research scientist (life sciences)  . Smokeless tobacco: Never Used   Substance and Sexual Activity  . Alcohol use: Yes  . Drug use: No  . Sexual activity: Not on file  Other Topics Concern  . Not on file  Social History Narrative  . Not on file   Social Determinants of Health   Financial Resource Strain:   . Difficulty of Paying Living Expenses:   Food Insecurity:   . Worried About Charity fundraiser in the Last Year:   . Arboriculturist in the Last Year:   Transportation Needs:   . Film/video editor (Medical):   [name] Lack of Transportation (Non-Medical):   Physical Activity:   . Days of Exercise per Week:   . Minutes of Exercise per Session:   Stress:   . Feeling of Stress :   Social Connections:   . Frequency of Communication with Friends and Family:   . Frequency of Social Gatherings with Friends and Family:   . Attends Religious Services:   . Active Member of Clubs or Organizations:   . Attends Archivist Meetings:   [name] Marital Status:   Intimate Partner Violence:   . Fear of Current or Ex-Partner:   . Emotionally Abused:   [name] Physically Abused:   . Sexually Abused:     Family History  Problem Relation Age of Onset  . Cancer Mother   . Heart attack Maternal Grandfather   . Stroke Paternal Grandfather   . Hypertension Sister     ROS: no fevers or chills, productive cough, hemoptysis, dysphasia, odynophagia, melena, hematochezia, dysuria, hematuria, rash, seizure activity, orthopnea, PND, pedal edema, claudication. Remaining systems are negative.  Physical Exam: Well-developed well-nourished in no acute distress.  Skin is warm and dry.  HEENT is normal.  Neck is supple.  Chest is clear to auscultation with normal expansion.  Cardiovascular exam is regular rate and rhythm.  Abdominal exam nontender or distended. No masses palpated. Extremities show no edema. neuro grossly intact  ECG- personally reviewed  A/P  1 pericardial effusion-I personally reviewed the patient's echocardiogram.  Pericardial fusion  appears more moderate.  This may be secondary to his lung cancer.  However clinically he is not in tamponade.  I will repeat his echocardiogram 2 weeks from the most recent.  I have explained that he may require pericardial window in the future.  2 chronic systolic congestive heart failure-patient appears to be euvolemic today.  Continue present dose of diuretic.  3 ischemic cardiomyopathy-patient has had difficulties with hypotension with ongoing chemotherapy.  He is therefore not on an ARB, Entresto or beta-blocker.  4 paroxysmal atrial fibrillation-continue amiodarone and apixaban.  Note his apixaban dose was decreased previously as his weight was below 60 kg and his creatinine was greater than 1.5.  His weight has now increased as he is off of chemotherapy.  We will follow this and likely increase apixaban in the future if his weight remains stable.  5 coronary artery disease-continue statin.  He is not on aspirin given need for anticoagulation.  6 prior ICD-managed by electrophysiology.  7 ventricular tachycardia-continue amiodarone.  8 hyperlipidemia-continue statin.  9  small cell lung cancer-patient is managed by oncology at [facility].  Kirk Ruths, MD

## 2019-07-31 ENCOUNTER — Ambulatory Visit (HOSPITAL_COMMUNITY)
Admission: RE | Admit: 2019-07-31 | Discharge: 2019-07-31 | Disposition: A | Discharge status: Home / Self Care | Payer: Medicare PPO | Source: Ambulatory Visit | Attending: Cardiology | Admitting: Cardiology

## 2019-07-31 ENCOUNTER — Other Ambulatory Visit: Payer: Self-pay

## 2019-07-31 DIAGNOSIS — I313 Pericardial effusion (noninflammatory): Secondary | ICD-10-CM | POA: Diagnosis present

## 2019-07-31 DIAGNOSIS — I3139 Other pericardial effusion (noninflammatory): Secondary | ICD-10-CM

## 2019-07-31 NOTE — Progress Notes (Signed)
*  PRELIMINARY RESULTS* Echocardiogram 2D Echocardiogram has been performed.  Michael Krueger 07/31/2019, 2:48 PM

## 2019-08-01 ENCOUNTER — Ambulatory Visit (HOSPITAL_COMMUNITY): Payer: Medicare PPO

## 2019-08-13 ENCOUNTER — Telehealth: Payer: Self-pay

## 2019-08-13 NOTE — Telephone Encounter (Signed)
After speaking with Med[employer] Rep, Tomi Bamberger.  Called Jetta back and provided Marcia's phone number to call to arrange visit to home.

## 2019-08-13 NOTE — Telephone Encounter (Signed)
Ewing Schlein from hospice called wanting to let us know patient is actively dying and she needs a rep to come out and turn off the ICD since he wont need it anymore. Her number is [phone]

## 2019-08-13 NOTE — Telephone Encounter (Signed)
Written order received from Hospice, sent to MR for scanning.

## 2019-08-13 NOTE — Telephone Encounter (Signed)
Caller is Hospice nurse, states pt is actively dying, bedridden/ not eating.  They anticipate death within a week.  Caller has signed order from Hospice MD to turn off ICD function of device.  She will fax order to our office.  Informed nurse of option to place a magnet over ICD to deactivate shocking function if that is needed prior to [employer] rep being able to go to home to turn off ICD function/

## 2019-08-15 NOTE — Progress Notes (Deleted)
HPI: FU CAD, ischemic cardiomyopathy, systolic HF, status post BiV-ICD, V. tach status post ablation, atrial fibrillation on amiodarone.Patient is being treated formetastaticsmall cell lung cancer. Now on hospice care. Since last seen,  Studies/Reports Reviewed Today:  Echo3/18/21 -Severe LV dysfunction, severe left ventricular enlargement, grade 2 diastolic dysfunction, mild RV dysfunction, severe left atrial enlargement, large pericardial effusion, mild mitral regurgitation and trace aortic insufficiency.  I reviewed and felt the pericardial fusion was likely moderate in size.  Also note pericardial effusion was described as small by echocardiogram November 2020 at [facility].  Follow-up echocardiogram July 31, 2019 showed ejection fraction 20 to 25%, severe left ventricular enlargement, mildly reduced RV function, severe left atrial enlargement, moderate pericardial effusion without tamponade.  There was mild to moderate mitral regurgitation.  Chest CT 2/21 -New nodule right lower lobe, moderate pericardial effusion.  Myoview 08/2011 Large area of scar in the anterior, anteroseptal, inferoseptal, inferior, anterolateral and apical distributions.  No signif ischemia. LV Ejection Fraction: 14%. LV Wall Motion: Severe diffuse hypokinesis, inferior, apical akinesis.  Cardiac catheterization 10/2008 LM: Okay LAD: Stent patent with 30% ISR, mid and distal 30% LCx: Tiny marginal branch 80% proximal, proximal to this stent patent with 30% ISR, mid CFX 40% RCA: 70% after RV branch then occluded (bridging collaterals) EF 10-15%   Current Outpatient Medications  Medication Sig Dispense Refill  . acetaminophen (TYLENOL) 500 MG tablet Take 1,000 mg by mouth daily as needed for headache.     [name] amiodarone (PACERONE) 200 MG tablet Take 1 tablet (200 mg total) by mouth 2 (two) times daily. 180 tablet 3  . apixaban (ELIQUIS) 2.5 MG TABS tablet Take 1 tablet (2.5 mg total)  by mouth 2 (two) times daily. 180 tablet 3  . atorvastatin (LIPITOR) 80 MG tablet TAKE 1 TABLET (80 MG TOTAL) BY MOUTH DAILY. 90 tablet 3  . cycloSPORINE (RESTASIS) 0.05 % ophthalmic emulsion Place 1 drop into both eyes 2 (two) times daily.    . furosemide (LASIX) 40 MG tablet Take one tablet by mouth daily.  May take an tablet for weight increase of 2 to 3 pounds overnight or 5 pounds in 1 week. 180 tablet 3  . levothyroxine (SYNTHROID) 25 MCG tablet Take 1 tablet (25 mcg total) by mouth daily before breakfast. 90 tablet 3  . pantoprazole (PROTONIX) 40 MG tablet Take 1 tablet by mouth Daily.    . traZODone (DESYREL) 50 MG tablet Take 50 mg by mouth daily as needed for sleep.      No current facility-administered medications for this visit.     Past Medical History:  Diagnosis Date  . Atrial fibrillation ([place])   . AV BLOCK, COMPLETE   . CAD   . Chronic systolic heart failure ([place])   . Gynecomastia   . HYPERLIPIDEMIA-MIXED   . HYPERTHYROIDISM   . Implantable cardiac defibrillator  medtronic    Initially related 1990s, upgrade to dual-chamber 2002, generator change 2005, generator change August 2010, hematoma evacuation September 2010.  . Ischemic cardiomyopathy   . Long term current use of anticoagulant   . OBESITY-MORBID (>100')   . TRANSAMINASES, SERUM, ELEVATED   . VENTRICULAR TACHYCARDIA    S/P RFCA x3  2006, 2010,2010    Past Surgical History:  Procedure Laterality Date  . IMPLANTABLE CARDIOVERTER DEFIBRILLATOR GENERATOR CHANGE N/A 10/23/2013   Procedure: IMPLANTABLE CARDIOVERTER DEFIBRILLATOR GENERATOR CHANGE;  Surgeon: Deboraha Sprang, MD;  Location: [facility] CATH LAB;  Service: Cardiovascular;  Laterality: N/A;  .  SPLENECTOMY    . Status post Medtronic Concerto CRT-D in August 2010 with pocket revision      Social History   Socioeconomic History  . Marital status: Single    Spouse name: Not on file  . Number of children: Not on file  . Years of education: Not on file  .  Highest education level: Not on file  Occupational History  . Not on file  Tobacco Use  . Smoking status: Former Research scientist (life sciences)  . Smokeless tobacco: Never Used  Substance and Sexual Activity  . Alcohol use: Yes  . Drug use: No  . Sexual activity: Not on file  Other Topics Concern  . Not on file  Social History Narrative  . Not on file   Social Determinants of Health   Financial Resource Strain:   . Difficulty of Paying Living Expenses:   Food Insecurity:   . Worried About Charity fundraiser in the Last Year:   . Arboriculturist in the Last Year:   Transportation Needs:   . Film/video editor (Medical):   [name] Lack of Transportation (Non-Medical):   Physical Activity:   . Days of Exercise per Week:   . Minutes of Exercise per Session:   Stress:   . Feeling of Stress :   Social Connections:   . Frequency of Communication with Friends and Family:   . Frequency of Social Gatherings with Friends and Family:   . Attends Religious Services:   . Active Member of Clubs or Organizations:   . Attends Archivist Meetings:   [name] Marital Status:   Intimate Partner Violence:   . Fear of Current or Ex-Partner:   . Emotionally Abused:   [name] Physically Abused:   . Sexually Abused:     Family History  Problem Relation Age of Onset  . Cancer Mother   . Heart attack Maternal Grandfather   . Stroke Paternal Grandfather   . Hypertension Sister     ROS: no fevers or chills, productive cough, hemoptysis, dysphasia, odynophagia, melena, hematochezia, dysuria, hematuria, rash, seizure activity, orthopnea, PND, pedal edema, claudication. Remaining systems are negative.  Physical Exam: Well-developed well-nourished in no acute distress.  Skin is warm and dry.  HEENT is normal.  Neck is supple.  Chest is clear to auscultation with normal expansion.  Cardiovascular exam is regular rate and rhythm.  Abdominal exam nontender or distended. No masses palpated. Extremities show no  edema. neuro grossly intact  ECG- personally reviewed  A/P  1 pericardial effusion-moderate on most recent echocardiogram.  Patient is now on hospice care.  We will not pursue follow-up echoes.  2 chronic systolic congestive heart failure-he is euvolemic.  Continue diuretic at present dose.   3 ischemic cardiomyopathy-he is not on ARB, Entresto or beta-blocker due to hypotension.  Also now on hospice care.  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  8 hyperlipidemia  9 ventricular tachycardia-  Kirk Ruths, MD

## 2019-08-20 ENCOUNTER — Ambulatory Visit: Payer: Medicare PPO | Admitting: Cardiology

## 2019-08-24 NOTE — Telephone Encounter (Signed)
noted 

## 2019-08-31 DEATH — deceased
